# Patient Record
Sex: Male | Born: 1960
Health system: Southern US, Community
[De-identification: ages and names within clinical notes are randomized; demographics above are authoritative.]

## PROBLEM LIST (undated history)

## (undated) DIAGNOSIS — E871 Hypo-osmolality and hyponatremia: Secondary | ICD-10-CM

## (undated) DIAGNOSIS — M48061 Spinal stenosis, lumbar region without neurogenic claudication: Secondary | ICD-10-CM

## (undated) DIAGNOSIS — N5201 Erectile dysfunction due to arterial insufficiency: Secondary | ICD-10-CM

## (undated) DIAGNOSIS — M545 Low back pain: Secondary | ICD-10-CM

## (undated) DIAGNOSIS — I739 Peripheral vascular disease, unspecified: Secondary | ICD-10-CM

## (undated) DIAGNOSIS — S90852A Superficial foreign body, left foot, initial encounter: Secondary | ICD-10-CM

## (undated) DIAGNOSIS — E119 Type 2 diabetes mellitus without complications: Secondary | ICD-10-CM

## (undated) DIAGNOSIS — L97512 Non-pressure chronic ulcer of other part of right foot with fat layer exposed: Secondary | ICD-10-CM

## (undated) DIAGNOSIS — L03032 Cellulitis of left toe: Secondary | ICD-10-CM

## (undated) DIAGNOSIS — L02619 Cutaneous abscess of unspecified foot: Secondary | ICD-10-CM

## (undated) DIAGNOSIS — M5417 Radiculopathy, lumbosacral region: Secondary | ICD-10-CM

## (undated) DIAGNOSIS — M1990 Unspecified osteoarthritis, unspecified site: Secondary | ICD-10-CM

## (undated) DIAGNOSIS — I10 Essential (primary) hypertension: Secondary | ICD-10-CM

## (undated) DIAGNOSIS — E11621 Type 2 diabetes mellitus with foot ulcer: Secondary | ICD-10-CM

## (undated) DIAGNOSIS — K5790 Diverticulosis of intestine, part unspecified, without perforation or abscess without bleeding: Secondary | ICD-10-CM

## (undated) DIAGNOSIS — L97524 Non-pressure chronic ulcer of other part of left foot with necrosis of bone: Secondary | ICD-10-CM

## (undated) DIAGNOSIS — M5126 Other intervertebral disc displacement, lumbar region: Secondary | ICD-10-CM

## (undated) DIAGNOSIS — M2042 Other hammer toe(s) (acquired), left foot: Secondary | ICD-10-CM

## (undated) DIAGNOSIS — M2041 Other hammer toe(s) (acquired), right foot: Secondary | ICD-10-CM

## (undated) DIAGNOSIS — M86472 Chronic osteomyelitis with draining sinus, left ankle and foot: Secondary | ICD-10-CM

## (undated) DIAGNOSIS — IMO0002 Reserved for concepts with insufficient information to code with codable children: Secondary | ICD-10-CM

## (undated) DIAGNOSIS — M5136 Other intervertebral disc degeneration, lumbar region: Secondary | ICD-10-CM

## (undated) DIAGNOSIS — M5416 Radiculopathy, lumbar region: Secondary | ICD-10-CM

## (undated) DIAGNOSIS — M869 Osteomyelitis, unspecified: Secondary | ICD-10-CM

## (undated) DIAGNOSIS — IMO0001 Reserved for inherently not codable concepts without codable children: Secondary | ICD-10-CM

## (undated) DIAGNOSIS — R079 Chest pain, unspecified: Secondary | ICD-10-CM

## (undated) DIAGNOSIS — E1151 Type 2 diabetes mellitus with diabetic peripheral angiopathy without gangrene: Secondary | ICD-10-CM

## (undated) DIAGNOSIS — G8929 Other chronic pain: Secondary | ICD-10-CM

## (undated) DIAGNOSIS — M25551 Pain in right hip: Secondary | ICD-10-CM

## (undated) DIAGNOSIS — L02612 Cutaneous abscess of left foot: Secondary | ICD-10-CM

## (undated) DIAGNOSIS — I2699 Other pulmonary embolism without acute cor pulmonale: Secondary | ICD-10-CM

## (undated) DIAGNOSIS — L089 Local infection of the skin and subcutaneous tissue, unspecified: Secondary | ICD-10-CM

## (undated) DIAGNOSIS — D649 Anemia, unspecified: Secondary | ICD-10-CM

## (undated) DIAGNOSIS — L03039 Cellulitis of unspecified toe: Secondary | ICD-10-CM

## (undated) DIAGNOSIS — S98119A Complete traumatic amputation of unspecified great toe, initial encounter: Secondary | ICD-10-CM

## (undated) DIAGNOSIS — M549 Dorsalgia, unspecified: Secondary | ICD-10-CM

## (undated) DIAGNOSIS — E785 Hyperlipidemia, unspecified: Secondary | ICD-10-CM

## (undated) DIAGNOSIS — E1142 Type 2 diabetes mellitus with diabetic polyneuropathy: Secondary | ICD-10-CM

## (undated) HISTORY — DX: Reserved for inherently not codable concepts without codable children: IMO0001

## (undated) HISTORY — DX: Peripheral vascular disease, unspecified: I73.9

## (undated) HISTORY — DX: Chest pain, unspecified: R07.9

## (undated) HISTORY — DX: Other intervertebral disc degeneration, lumbar region: M51.36

## (undated) HISTORY — DX: Cellulitis of unspecified toe: L03.039

## (undated) HISTORY — PX: CARDIAC CATHETERIZATION: SHX172

## (undated) HISTORY — DX: Other intervertebral disc displacement, lumbar region: M51.26

## (undated) HISTORY — DX: Non-pressure chronic ulcer of other part of right foot with fat layer exposed: L97.512

## (undated) HISTORY — DX: Radiculopathy, lumbosacral region: M54.17

## (undated) HISTORY — DX: Hyperlipidemia, unspecified: E78.5

## (undated) HISTORY — DX: Other pulmonary embolism without acute cor pulmonale: I26.99

## (undated) HISTORY — DX: Osteomyelitis, unspecified: M86.9

## (undated) HISTORY — DX: Reserved for concepts with insufficient information to code with codable children: IMO0002

## (undated) HISTORY — DX: Low back pain: M54.5

## (undated) HISTORY — DX: Unspecified osteoarthritis, unspecified site: M19.90

## (undated) HISTORY — DX: Pain in right hip: M25.551

## (undated) HISTORY — DX: Cutaneous abscess of unspecified foot: L02.619

## (undated) HISTORY — DX: Anemia, unspecified: D64.9

## (undated) HISTORY — DX: Other hammer toe(s) (acquired), left foot: M20.42

## (undated) HISTORY — DX: Radiculopathy, lumbar region: M54.16

## (undated) HISTORY — DX: Type 2 diabetes mellitus without complications: E11.9

## (undated) HISTORY — DX: Type 2 diabetes mellitus with diabetic polyneuropathy: E11.42

## (undated) HISTORY — DX: Local infection of the skin and subcutaneous tissue, unspecified: L08.9

## (undated) HISTORY — DX: Diverticulosis of intestine, part unspecified, without perforation or abscess without bleeding: K57.90

## (undated) HISTORY — DX: Essential (primary) hypertension: I10

## (undated) HISTORY — DX: Cellulitis of left toe: L03.032

## (undated) HISTORY — DX: Erectile dysfunction due to arterial insufficiency: N52.01

## (undated) HISTORY — DX: Type 2 diabetes mellitus with foot ulcer: E11.621

## (undated) HISTORY — DX: Dorsalgia, unspecified: M54.9

## (undated) HISTORY — DX: Cutaneous abscess of left foot: L02.612

## (undated) HISTORY — DX: Other hammer toe(s) (acquired), right foot: M20.41

## (undated) HISTORY — DX: Non-pressure chronic ulcer of other part of left foot with necrosis of bone: L97.524

## (undated) HISTORY — DX: Superficial foreign body, left foot, initial encounter: S90.852A

## (undated) HISTORY — DX: Hypo-osmolality and hyponatremia: E87.1

## (undated) HISTORY — DX: Chronic osteomyelitis with draining sinus, left ankle and foot: M86.472

## (undated) HISTORY — DX: Other chronic pain: G89.29

## (undated) HISTORY — DX: Spinal stenosis, lumbar region without neurogenic claudication: M48.061

## (undated) HISTORY — DX: Complete traumatic amputation of unspecified great toe, initial encounter: S98.119A

## (undated) HISTORY — DX: Type 2 diabetes mellitus with diabetic peripheral angiopathy without gangrene: E11.51

## (undated) HISTORY — PX: TOE AMPUTATION: SHX809

## (undated) HISTORY — PX: KNEE CARTILAGE SURGERY: SHX688

---

## 2014-03-31 DIAGNOSIS — L97512 Non-pressure chronic ulcer of other part of right foot with fat layer exposed: Secondary | ICD-10-CM | POA: Insufficient documentation

## 2014-03-31 HISTORY — DX: Non-pressure chronic ulcer of other part of right foot with fat layer exposed: L97.512

## 2014-04-02 DIAGNOSIS — IMO0001 Reserved for inherently not codable concepts without codable children: Secondary | ICD-10-CM

## 2014-04-02 DIAGNOSIS — D649 Anemia, unspecified: Secondary | ICD-10-CM

## 2014-04-02 DIAGNOSIS — E785 Hyperlipidemia, unspecified: Secondary | ICD-10-CM

## 2014-04-02 DIAGNOSIS — E1151 Type 2 diabetes mellitus with diabetic peripheral angiopathy without gangrene: Secondary | ICD-10-CM

## 2014-04-02 HISTORY — DX: Hyperlipidemia, unspecified: E78.5

## 2014-04-02 HISTORY — DX: Anemia, unspecified: D64.9

## 2014-04-02 HISTORY — DX: Type 2 diabetes mellitus with diabetic peripheral angiopathy without gangrene: E11.51

## 2014-04-02 HISTORY — DX: Reserved for inherently not codable concepts without codable children: IMO0001

## 2014-04-07 DIAGNOSIS — M549 Dorsalgia, unspecified: Secondary | ICD-10-CM

## 2014-04-07 DIAGNOSIS — M25551 Pain in right hip: Secondary | ICD-10-CM

## 2014-04-07 HISTORY — DX: Pain in right hip: M25.551

## 2014-04-07 HISTORY — DX: Dorsalgia, unspecified: M54.9

## 2014-04-27 DIAGNOSIS — I10 Essential (primary) hypertension: Secondary | ICD-10-CM | POA: Insufficient documentation

## 2014-05-22 ENCOUNTER — Ambulatory Visit (INDEPENDENT_AMBULATORY_CARE_PROVIDER_SITE_OTHER): Payer: Medicare Other

## 2014-05-22 VITALS — BP 143/69 | HR 86 | Resp 12

## 2014-05-22 DIAGNOSIS — R52 Pain, unspecified: Secondary | ICD-10-CM

## 2014-05-22 DIAGNOSIS — M86671 Other chronic osteomyelitis, right ankle and foot: Secondary | ICD-10-CM

## 2014-05-22 DIAGNOSIS — E1140 Type 2 diabetes mellitus with diabetic neuropathy, unspecified: Secondary | ICD-10-CM

## 2014-05-22 DIAGNOSIS — L97514 Non-pressure chronic ulcer of other part of right foot with necrosis of bone: Secondary | ICD-10-CM

## 2014-05-22 MED ORDER — CLINDAMYCIN HCL 300 MG PO CAPS: 300.0000 mg | ORAL_CAPSULE | Freq: Three times a day (TID) | ORAL | Status: AC

## 2014-05-22 MED ORDER — SILVER SULFADIAZINE 1 % EX CREA: 1.0000 "application " | TOPICAL_CREAM | Freq: Every day | CUTANEOUS | Status: AC

## 2014-05-22 NOTE — Patient Instructions (Signed)
Pre-Operative Instructions  Congratulations, you have decided to take an important step to improving your quality of life.  You can be assured that the doctors of [facility] will be with you every step of the way.  1. Plan to be at the surgery center/hospital at least 1 (one) hour prior to your scheduled time unless otherwise directed by the surgical center/hospital staff.  You must have a responsible adult accompany you, remain during the surgery and drive you home.  Make sure you have directions to the surgical center/hospital and know how to get there on time. 2. For hospital based surgery you will need to obtain a history and physical form from your family physician within 1 month prior to the date of surgery- we will give you a form for you primary physician.  3. We make every effort to accommodate the date you request for surgery.  There are however, times where surgery dates or times have to be moved.  We will contact you as soon as possible if a change in schedule is required.   4. No Aspirin/Ibuprofen for one week before surgery.  If you are on aspirin, any non-steroidal anti-inflammatory medications (Mobic, Aleve, Ibuprofen) you should stop taking it 7 days prior to your surgery.  You make take Tylenol  For pain prior to surgery.  5. Medications- If you are taking daily heart and blood pressure medications, seizure, reflux, allergy, asthma, anxiety, pain or diabetes medications, make sure the surgery center/hospital is aware before the day of surgery so they may notify you which medications to take or avoid the day of surgery. 6. No food or drink after midnight the night before surgery unless directed otherwise by surgical center/hospital staff. 7. No alcoholic beverages 24 hours prior to surgery.  No smoking 24 hours prior to or 24 hours after surgery. 8. Wear loose pants or shorts- loose enough to fit over bandages, boots, and casts. 9. No slip on shoes, sneakers are best. 10. Bring  your boot with you to the surgery center/hospital.  Also bring crutches or a walker if your physician has prescribed it for you.  If you do not have this equipment, it will be provided for you after surgery. 11. If you have not been contracted by the surgery center/hospital by the day before your surgery, call to confirm the date and time of your surgery. 12. Leave-time from work may vary depending on the type of surgery you have.  Appropriate arrangements should be made prior to surgery with your employer. 13. Prescriptions will be provided immediately following surgery by your doctor.  Have these filled as soon as possible after surgery and take the medication as directed. 14. Remove nail polish on the operative foot. 15. Wash the night before surgery.  The night before surgery wash the foot and leg well with the antibacterial soap provided and water paying special attention to beneath the toenails and in between the toes.  Rinse thoroughly with water and dry well with a towel.  Perform this wash unless told not to do so by your physician.  Enclosed: 1 Ice pack (please put in freezer the night before surgery)   1 Hibiclens skin cleaner   Pre-op Instructions  If you have any questions regarding the instructions, do not hesitate to call our office.  [place]: [address] , [place] [address] [phone]  [place]: [address] [place], [place] [address] [phone]  [place]: [address]  [place], [place] [address] [phone]  Dr. Toya Palacios   Tuchman DPM, Dr. Norman Regal DPM Dr. Carmon Brigandi DPM, Dr. M. Todd Hyatt DPM, Dr. Kathryn Egerton DPM 

## 2014-05-22 NOTE — Progress Notes (Signed)
   Subjective:    Patient ID: [place], male    DOB: [birth year], 53 y.o.   MRN: [id number]  HPI  PT STATED RT FOOT GREAT TOE HAVE AN OPEN WOUND AND HAVE DRAINIGE FOR 3 YEARS. THE TOE IS GETTING WORSE AND IS NOT GETTING ANY BETTER. PT IS DIABETIC AND DO NOT HAVE ANY SENSATION OR PART OF THE TOE CANNOT FEEL IT. DR. Delora [place] FROM WOUND CARE BEEN TAKING CARE OF THE WOUND. ALSO, 5 WEEKS AGO WENT TO [name]. STATED HAVE BONE INFECTION.  Review of Systems  Musculoskeletal: Positive for back pain and gait problem.  All other systems reviewed and are negative.      Objective:   Physical Exam Patient is a 53 year old white male well-developed well-nourished oriented 3 recently moved here from [place]. Has a three-year history of an ulcer under the hallux IP joint right great toe with recent treatments including hospitalization and IV antibiotics by Dr. Nigel [place]  Dial.  Patient apparently had an MRI and x-rays today depict lysis of the bone consistent with chronic osteomyelitic changes of the hallux distal proximal phalanx particular the IP joint of the right great toe. The ulcer greater than a centimeter in diameter with a macerated necrotic and fibrous under down to the IP joint itself. Patient's joint is swollen enlarged painful localized edema and erythema no ascending cellulitis noted no fever or chills patient otherwise asymptomatic does have profound diabetic neuropathy. Pedal pulses palpable DP and PT +2 over 4 bilateral mild varicosities noted Refill time 3 seconds all digits. Epicritic sensations grossly diminished on Semmes Weinstein the forefoot digits and arch. There is normal plantar response DTRs not elicited dermatologically skin color pigment normal hair growth present but diminished distally nail somewhat criptotic orthopedic exam there is rigid contracture the toe hallux interphalangeus deformity noted clinically and radiographically x-rays reveal cystic and lytic changes of the  hallux IP joint itself consistent with likely chronic osteomyelitic change. Patient recently had been on antibiotics apparently clindamycin IV however we'll switch over to clindamycin by mouth regimen at this time. Patient is requesting surgery understands the toe needs to be amputated however that because of some insurance issues he is not able to be seen at [facility] and since he is moved here recently is requesting that we do the procedure. Patient's primary physician her diabetes doctor is Dr. Durenda Mason in [name] will get approval from Dr. [place] for the proposed surgery. Remainder the exam unremarkable noncontributory.      Assessment & Plan:  Assessment diabetes with peripheral neuropathy and chronic ulceration likely with chronic osteolytic change of the hallux IP joint with exposure down to bone and capsule. Plan at this time likely amputation at the [facility] joint consent form and risks and cup occasions are reviewed all questions asked by the patient are answered there were medications and surgery scheduled his convenience at the [facility] specialty surgical center on an outpatient basis. Will be with by mouth antibiotic regimen postoperatively and at this time initiate clindamycin and Silvadene and gauze dressing changes daily. Consent form is reviewed and signed and surgery scheduled within the next week at patient's convenience.  [place] DPM

## 2014-05-24 ENCOUNTER — Telehealth: Payer: Self-pay | Admitting: *Deleted

## 2014-05-24 NOTE — Telephone Encounter (Addendum)
Pt states he was scheduled for a big toe amputation, and prescribed 2 medications, but one of the antibiotics cost over $75.00.  Could Dr. Blenda Mounts change the medication?  I spoke with the [facility] pharmacist [phone] if the Clinamycin 300mg  was a generic, she states it was and cost over $75.00, but could order Clinamycin 150mg  2 capsules tid #60 for $45.69.  I okayed the Clindamycin 150mg  #60 and informed pt.  Pt states he feels he could more afford the $45.69 than $75.00, but will check his accounts and call again.  Pt states he cannot afford the $45.69, ask if  there is an alternative medication.  Dr. Blenda Mounts ordered Septra DS #30 one tablet bid with one refill.  Orders called to [facility] [phone] at 305pm given to Pharmacist Stacy, and called to pt.

## 2014-11-22 DIAGNOSIS — E871 Hypo-osmolality and hyponatremia: Secondary | ICD-10-CM

## 2014-11-22 HISTORY — DX: Hypo-osmolality and hyponatremia: E87.1

## 2014-12-19 DIAGNOSIS — M545 Low back pain, unspecified: Secondary | ICD-10-CM | POA: Insufficient documentation

## 2014-12-19 DIAGNOSIS — G8929 Other chronic pain: Secondary | ICD-10-CM

## 2014-12-19 HISTORY — DX: Other chronic pain: G89.29

## 2014-12-19 HISTORY — DX: Low back pain, unspecified: M54.50

## 2015-01-08 DIAGNOSIS — L97509 Non-pressure chronic ulcer of other part of unspecified foot with unspecified severity: Secondary | ICD-10-CM | POA: Diagnosis not present

## 2015-01-08 DIAGNOSIS — E10621 Type 1 diabetes mellitus with foot ulcer: Secondary | ICD-10-CM | POA: Diagnosis not present

## 2015-01-08 DIAGNOSIS — Z89411 Acquired absence of right great toe: Secondary | ICD-10-CM | POA: Diagnosis not present

## 2015-01-08 DIAGNOSIS — Z89421 Acquired absence of other right toe(s): Secondary | ICD-10-CM | POA: Diagnosis not present

## 2015-01-12 DIAGNOSIS — G894 Chronic pain syndrome: Secondary | ICD-10-CM | POA: Diagnosis not present

## 2015-01-14 DIAGNOSIS — Z89412 Acquired absence of left great toe: Secondary | ICD-10-CM | POA: Diagnosis not present

## 2015-01-14 DIAGNOSIS — A4902 Methicillin resistant Staphylococcus aureus infection, unspecified site: Secondary | ICD-10-CM | POA: Diagnosis not present

## 2015-01-14 DIAGNOSIS — E1151 Type 2 diabetes mellitus with diabetic peripheral angiopathy without gangrene: Secondary | ICD-10-CM | POA: Diagnosis not present

## 2015-01-14 DIAGNOSIS — L97529 Non-pressure chronic ulcer of other part of left foot with unspecified severity: Secondary | ICD-10-CM | POA: Diagnosis not present

## 2015-01-14 DIAGNOSIS — Z87891 Personal history of nicotine dependence: Secondary | ICD-10-CM | POA: Diagnosis not present

## 2015-01-14 DIAGNOSIS — M5441 Lumbago with sciatica, right side: Secondary | ICD-10-CM | POA: Diagnosis not present

## 2015-01-14 DIAGNOSIS — E11628 Type 2 diabetes mellitus with other skin complications: Secondary | ICD-10-CM | POA: Diagnosis not present

## 2015-01-14 DIAGNOSIS — E785 Hyperlipidemia, unspecified: Secondary | ICD-10-CM | POA: Diagnosis not present

## 2015-01-14 DIAGNOSIS — L97429 Non-pressure chronic ulcer of left heel and midfoot with unspecified severity: Secondary | ICD-10-CM | POA: Diagnosis not present

## 2015-01-14 DIAGNOSIS — G4730 Sleep apnea, unspecified: Secondary | ICD-10-CM | POA: Diagnosis not present

## 2015-01-14 DIAGNOSIS — Z89411 Acquired absence of right great toe: Secondary | ICD-10-CM | POA: Diagnosis not present

## 2015-01-14 DIAGNOSIS — L02612 Cutaneous abscess of left foot: Secondary | ICD-10-CM | POA: Diagnosis not present

## 2015-01-14 DIAGNOSIS — G8929 Other chronic pain: Secondary | ICD-10-CM | POA: Diagnosis not present

## 2015-01-14 DIAGNOSIS — Z79899 Other long term (current) drug therapy: Secondary | ICD-10-CM | POA: Diagnosis not present

## 2015-01-14 DIAGNOSIS — Z452 Encounter for adjustment and management of vascular access device: Secondary | ICD-10-CM | POA: Diagnosis not present

## 2015-01-14 DIAGNOSIS — I252 Old myocardial infarction: Secondary | ICD-10-CM | POA: Diagnosis not present

## 2015-01-14 DIAGNOSIS — L97521 Non-pressure chronic ulcer of other part of left foot limited to breakdown of skin: Secondary | ICD-10-CM | POA: Diagnosis not present

## 2015-01-14 DIAGNOSIS — E11621 Type 2 diabetes mellitus with foot ulcer: Secondary | ICD-10-CM | POA: Diagnosis not present

## 2015-01-14 DIAGNOSIS — E1142 Type 2 diabetes mellitus with diabetic polyneuropathy: Secondary | ICD-10-CM | POA: Diagnosis not present

## 2015-01-14 DIAGNOSIS — M7989 Other specified soft tissue disorders: Secondary | ICD-10-CM | POA: Diagnosis not present

## 2015-01-14 DIAGNOSIS — M86172 Other acute osteomyelitis, left ankle and foot: Secondary | ICD-10-CM | POA: Diagnosis not present

## 2015-01-14 DIAGNOSIS — E118 Type 2 diabetes mellitus with unspecified complications: Secondary | ICD-10-CM | POA: Diagnosis not present

## 2015-01-14 DIAGNOSIS — M19072 Primary osteoarthritis, left ankle and foot: Secondary | ICD-10-CM | POA: Diagnosis not present

## 2015-01-14 DIAGNOSIS — Z6838 Body mass index (BMI) 38.0-38.9, adult: Secondary | ICD-10-CM | POA: Diagnosis not present

## 2015-01-14 DIAGNOSIS — M869 Osteomyelitis, unspecified: Secondary | ICD-10-CM | POA: Diagnosis not present

## 2015-01-14 DIAGNOSIS — M545 Low back pain: Secondary | ICD-10-CM | POA: Diagnosis not present

## 2015-01-14 DIAGNOSIS — I10 Essential (primary) hypertension: Secondary | ICD-10-CM | POA: Diagnosis not present

## 2015-01-14 DIAGNOSIS — Z794 Long term (current) use of insulin: Secondary | ICD-10-CM | POA: Diagnosis not present

## 2015-01-14 DIAGNOSIS — Z89421 Acquired absence of other right toe(s): Secondary | ICD-10-CM | POA: Diagnosis not present

## 2015-01-14 DIAGNOSIS — E1165 Type 2 diabetes mellitus with hyperglycemia: Secondary | ICD-10-CM | POA: Diagnosis not present

## 2015-01-14 DIAGNOSIS — I2510 Atherosclerotic heart disease of native coronary artery without angina pectoris: Secondary | ICD-10-CM | POA: Diagnosis not present

## 2015-01-16 DIAGNOSIS — M869 Osteomyelitis, unspecified: Secondary | ICD-10-CM

## 2015-01-16 HISTORY — DX: Osteomyelitis, unspecified: M86.9

## 2015-01-20 DIAGNOSIS — M869 Osteomyelitis, unspecified: Secondary | ICD-10-CM | POA: Diagnosis not present

## 2015-01-23 DIAGNOSIS — M5416 Radiculopathy, lumbar region: Secondary | ICD-10-CM | POA: Diagnosis not present

## 2015-01-23 DIAGNOSIS — G894 Chronic pain syndrome: Secondary | ICD-10-CM | POA: Diagnosis not present

## 2015-01-23 DIAGNOSIS — F4542 Pain disorder with related psychological factors: Secondary | ICD-10-CM | POA: Diagnosis not present

## 2015-01-23 DIAGNOSIS — M4800 Spinal stenosis, site unspecified: Secondary | ICD-10-CM | POA: Diagnosis not present

## 2015-01-23 DIAGNOSIS — M5126 Other intervertebral disc displacement, lumbar region: Secondary | ICD-10-CM | POA: Diagnosis not present

## 2015-01-23 DIAGNOSIS — M5136 Other intervertebral disc degeneration, lumbar region: Secondary | ICD-10-CM | POA: Diagnosis not present

## 2015-01-25 DIAGNOSIS — M869 Osteomyelitis, unspecified: Secondary | ICD-10-CM | POA: Diagnosis not present

## 2015-01-26 DIAGNOSIS — M869 Osteomyelitis, unspecified: Secondary | ICD-10-CM | POA: Diagnosis not present

## 2015-01-27 DIAGNOSIS — M869 Osteomyelitis, unspecified: Secondary | ICD-10-CM | POA: Diagnosis not present

## 2015-01-30 DIAGNOSIS — E11621 Type 2 diabetes mellitus with foot ulcer: Secondary | ICD-10-CM | POA: Diagnosis not present

## 2015-01-30 DIAGNOSIS — E1165 Type 2 diabetes mellitus with hyperglycemia: Secondary | ICD-10-CM | POA: Diagnosis not present

## 2015-01-30 DIAGNOSIS — Z1389 Encounter for screening for other disorder: Secondary | ICD-10-CM | POA: Diagnosis not present

## 2015-01-30 DIAGNOSIS — Z6841 Body Mass Index (BMI) 40.0 and over, adult: Secondary | ICD-10-CM | POA: Diagnosis not present

## 2015-01-30 DIAGNOSIS — R0981 Nasal congestion: Secondary | ICD-10-CM | POA: Diagnosis not present

## 2015-02-02 DIAGNOSIS — M869 Osteomyelitis, unspecified: Secondary | ICD-10-CM | POA: Diagnosis not present

## 2015-02-08 DIAGNOSIS — J069 Acute upper respiratory infection, unspecified: Secondary | ICD-10-CM | POA: Diagnosis not present

## 2015-02-08 DIAGNOSIS — Z6839 Body mass index (BMI) 39.0-39.9, adult: Secondary | ICD-10-CM | POA: Diagnosis not present

## 2015-02-08 DIAGNOSIS — R05 Cough: Secondary | ICD-10-CM | POA: Diagnosis not present

## 2015-02-10 DIAGNOSIS — Z794 Long term (current) use of insulin: Secondary | ICD-10-CM | POA: Diagnosis not present

## 2015-02-10 DIAGNOSIS — I10 Essential (primary) hypertension: Secondary | ICD-10-CM | POA: Diagnosis not present

## 2015-02-10 DIAGNOSIS — E118 Type 2 diabetes mellitus with unspecified complications: Secondary | ICD-10-CM | POA: Diagnosis not present

## 2015-02-10 DIAGNOSIS — E1165 Type 2 diabetes mellitus with hyperglycemia: Secondary | ICD-10-CM | POA: Diagnosis not present

## 2015-02-10 DIAGNOSIS — Z87891 Personal history of nicotine dependence: Secondary | ICD-10-CM | POA: Diagnosis not present

## 2015-02-10 DIAGNOSIS — Z79899 Other long term (current) drug therapy: Secondary | ICD-10-CM | POA: Diagnosis not present

## 2015-02-10 DIAGNOSIS — K5792 Diverticulitis of intestine, part unspecified, without perforation or abscess without bleeding: Secondary | ICD-10-CM | POA: Diagnosis not present

## 2015-02-10 DIAGNOSIS — I998 Other disorder of circulatory system: Secondary | ICD-10-CM | POA: Diagnosis not present

## 2015-02-12 DIAGNOSIS — L608 Other nail disorders: Secondary | ICD-10-CM | POA: Diagnosis not present

## 2015-02-12 DIAGNOSIS — Z89421 Acquired absence of other right toe(s): Secondary | ICD-10-CM | POA: Diagnosis not present

## 2015-02-12 DIAGNOSIS — L03032 Cellulitis of left toe: Secondary | ICD-10-CM | POA: Diagnosis not present

## 2015-02-12 DIAGNOSIS — E1142 Type 2 diabetes mellitus with diabetic polyneuropathy: Secondary | ICD-10-CM | POA: Diagnosis not present

## 2015-02-12 DIAGNOSIS — Z89411 Acquired absence of right great toe: Secondary | ICD-10-CM | POA: Diagnosis not present

## 2015-02-27 DIAGNOSIS — M5126 Other intervertebral disc displacement, lumbar region: Secondary | ICD-10-CM | POA: Diagnosis not present

## 2015-02-27 DIAGNOSIS — G894 Chronic pain syndrome: Secondary | ICD-10-CM | POA: Diagnosis not present

## 2015-02-27 DIAGNOSIS — F4542 Pain disorder with related psychological factors: Secondary | ICD-10-CM | POA: Diagnosis not present

## 2015-02-27 DIAGNOSIS — M4800 Spinal stenosis, site unspecified: Secondary | ICD-10-CM | POA: Diagnosis not present

## 2015-02-27 DIAGNOSIS — M5136 Other intervertebral disc degeneration, lumbar region: Secondary | ICD-10-CM | POA: Diagnosis not present

## 2015-02-27 DIAGNOSIS — M5416 Radiculopathy, lumbar region: Secondary | ICD-10-CM | POA: Diagnosis not present

## 2015-03-06 DIAGNOSIS — Z89412 Acquired absence of left great toe: Secondary | ICD-10-CM | POA: Diagnosis not present

## 2015-03-06 DIAGNOSIS — E780 Pure hypercholesterolemia: Secondary | ICD-10-CM | POA: Diagnosis not present

## 2015-03-06 DIAGNOSIS — E119 Type 2 diabetes mellitus without complications: Secondary | ICD-10-CM | POA: Diagnosis not present

## 2015-03-06 DIAGNOSIS — Z79899 Other long term (current) drug therapy: Secondary | ICD-10-CM | POA: Diagnosis not present

## 2015-03-06 DIAGNOSIS — M87875 Other osteonecrosis, left foot: Secondary | ICD-10-CM | POA: Diagnosis not present

## 2015-03-06 DIAGNOSIS — R1084 Generalized abdominal pain: Secondary | ICD-10-CM | POA: Diagnosis not present

## 2015-03-06 DIAGNOSIS — S39011A Strain of muscle, fascia and tendon of abdomen, initial encounter: Secondary | ICD-10-CM | POA: Diagnosis not present

## 2015-03-06 DIAGNOSIS — I96 Gangrene, not elsewhere classified: Secondary | ICD-10-CM | POA: Diagnosis not present

## 2015-03-06 DIAGNOSIS — Z794 Long term (current) use of insulin: Secondary | ICD-10-CM | POA: Diagnosis not present

## 2015-03-06 DIAGNOSIS — I10 Essential (primary) hypertension: Secondary | ICD-10-CM | POA: Diagnosis not present

## 2015-03-07 DIAGNOSIS — M868X7 Other osteomyelitis, ankle and foot: Secondary | ICD-10-CM | POA: Diagnosis not present

## 2015-03-07 DIAGNOSIS — R1031 Right lower quadrant pain: Secondary | ICD-10-CM | POA: Diagnosis not present

## 2015-03-07 DIAGNOSIS — L089 Local infection of the skin and subcutaneous tissue, unspecified: Secondary | ICD-10-CM | POA: Diagnosis not present

## 2015-03-07 DIAGNOSIS — E780 Pure hypercholesterolemia: Secondary | ICD-10-CM | POA: Diagnosis not present

## 2015-03-07 DIAGNOSIS — I10 Essential (primary) hypertension: Secondary | ICD-10-CM | POA: Diagnosis not present

## 2015-03-07 DIAGNOSIS — M609 Myositis, unspecified: Secondary | ICD-10-CM | POA: Diagnosis not present

## 2015-03-07 DIAGNOSIS — Z87891 Personal history of nicotine dependence: Secondary | ICD-10-CM | POA: Diagnosis not present

## 2015-03-07 DIAGNOSIS — L97529 Non-pressure chronic ulcer of other part of left foot with unspecified severity: Secondary | ICD-10-CM | POA: Diagnosis not present

## 2015-03-07 DIAGNOSIS — E1165 Type 2 diabetes mellitus with hyperglycemia: Secondary | ICD-10-CM | POA: Diagnosis not present

## 2015-03-07 DIAGNOSIS — G8929 Other chronic pain: Secondary | ICD-10-CM | POA: Diagnosis not present

## 2015-03-07 DIAGNOSIS — M86172 Other acute osteomyelitis, left ankle and foot: Secondary | ICD-10-CM | POA: Diagnosis not present

## 2015-03-07 DIAGNOSIS — Z8614 Personal history of Methicillin resistant Staphylococcus aureus infection: Secondary | ICD-10-CM | POA: Diagnosis not present

## 2015-03-07 DIAGNOSIS — Z89422 Acquired absence of other left toe(s): Secondary | ICD-10-CM | POA: Diagnosis not present

## 2015-03-07 DIAGNOSIS — E785 Hyperlipidemia, unspecified: Secondary | ICD-10-CM | POA: Diagnosis not present

## 2015-03-07 DIAGNOSIS — Z794 Long term (current) use of insulin: Secondary | ICD-10-CM | POA: Diagnosis not present

## 2015-03-07 DIAGNOSIS — D649 Anemia, unspecified: Secondary | ICD-10-CM | POA: Diagnosis not present

## 2015-03-07 DIAGNOSIS — M4800 Spinal stenosis, site unspecified: Secondary | ICD-10-CM | POA: Diagnosis not present

## 2015-03-07 DIAGNOSIS — E1151 Type 2 diabetes mellitus with diabetic peripheral angiopathy without gangrene: Secondary | ICD-10-CM | POA: Diagnosis not present

## 2015-03-07 DIAGNOSIS — Z79899 Other long term (current) drug therapy: Secondary | ICD-10-CM | POA: Diagnosis not present

## 2015-03-07 DIAGNOSIS — E11621 Type 2 diabetes mellitus with foot ulcer: Secondary | ICD-10-CM | POA: Diagnosis not present

## 2015-03-07 DIAGNOSIS — M87875 Other osteonecrosis, left foot: Secondary | ICD-10-CM | POA: Diagnosis not present

## 2015-03-07 DIAGNOSIS — Z89412 Acquired absence of left great toe: Secondary | ICD-10-CM | POA: Diagnosis not present

## 2015-03-07 DIAGNOSIS — H269 Unspecified cataract: Secondary | ICD-10-CM | POA: Diagnosis not present

## 2015-03-07 DIAGNOSIS — L03032 Cellulitis of left toe: Secondary | ICD-10-CM | POA: Diagnosis not present

## 2015-03-07 DIAGNOSIS — S39011A Strain of muscle, fascia and tendon of abdomen, initial encounter: Secondary | ICD-10-CM | POA: Diagnosis not present

## 2015-03-07 DIAGNOSIS — E119 Type 2 diabetes mellitus without complications: Secondary | ICD-10-CM | POA: Diagnosis not present

## 2015-03-07 DIAGNOSIS — M869 Osteomyelitis, unspecified: Secondary | ICD-10-CM | POA: Diagnosis not present

## 2015-03-19 DIAGNOSIS — Z89429 Acquired absence of other toe(s), unspecified side: Secondary | ICD-10-CM | POA: Diagnosis not present

## 2015-03-19 DIAGNOSIS — F419 Anxiety disorder, unspecified: Secondary | ICD-10-CM | POA: Diagnosis not present

## 2015-03-19 DIAGNOSIS — E1165 Type 2 diabetes mellitus with hyperglycemia: Secondary | ICD-10-CM | POA: Diagnosis not present

## 2015-03-19 DIAGNOSIS — G4700 Insomnia, unspecified: Secondary | ICD-10-CM | POA: Diagnosis not present

## 2015-03-19 DIAGNOSIS — Z794 Long term (current) use of insulin: Secondary | ICD-10-CM | POA: Diagnosis not present

## 2015-04-07 DIAGNOSIS — G603 Idiopathic progressive neuropathy: Secondary | ICD-10-CM | POA: Diagnosis not present

## 2015-04-07 DIAGNOSIS — M5441 Lumbago with sciatica, right side: Secondary | ICD-10-CM | POA: Diagnosis not present

## 2015-04-07 DIAGNOSIS — Z79899 Other long term (current) drug therapy: Secondary | ICD-10-CM | POA: Diagnosis not present

## 2015-04-07 DIAGNOSIS — G890 Central pain syndrome: Secondary | ICD-10-CM | POA: Diagnosis not present

## 2015-04-07 DIAGNOSIS — M545 Low back pain: Secondary | ICD-10-CM | POA: Diagnosis not present

## 2015-04-07 DIAGNOSIS — G541 Lumbosacral plexus disorders: Secondary | ICD-10-CM | POA: Diagnosis not present

## 2015-04-18 DIAGNOSIS — Z79891 Long term (current) use of opiate analgesic: Secondary | ICD-10-CM | POA: Diagnosis not present

## 2015-04-18 DIAGNOSIS — G890 Central pain syndrome: Secondary | ICD-10-CM | POA: Diagnosis not present

## 2015-04-18 DIAGNOSIS — M25552 Pain in left hip: Secondary | ICD-10-CM | POA: Diagnosis not present

## 2015-04-18 DIAGNOSIS — M545 Low back pain: Secondary | ICD-10-CM | POA: Diagnosis not present

## 2015-04-18 DIAGNOSIS — M25551 Pain in right hip: Secondary | ICD-10-CM | POA: Diagnosis not present

## 2015-04-27 DIAGNOSIS — M545 Low back pain: Secondary | ICD-10-CM | POA: Diagnosis not present

## 2015-04-27 DIAGNOSIS — R262 Difficulty in walking, not elsewhere classified: Secondary | ICD-10-CM | POA: Diagnosis not present

## 2015-04-27 DIAGNOSIS — E119 Type 2 diabetes mellitus without complications: Secondary | ICD-10-CM | POA: Diagnosis not present

## 2015-04-27 DIAGNOSIS — M6281 Muscle weakness (generalized): Secondary | ICD-10-CM | POA: Diagnosis not present

## 2015-05-03 DIAGNOSIS — F419 Anxiety disorder, unspecified: Secondary | ICD-10-CM | POA: Diagnosis not present

## 2015-05-03 DIAGNOSIS — E1165 Type 2 diabetes mellitus with hyperglycemia: Secondary | ICD-10-CM | POA: Diagnosis not present

## 2015-05-03 DIAGNOSIS — E663 Overweight: Secondary | ICD-10-CM | POA: Diagnosis not present

## 2015-05-03 DIAGNOSIS — G629 Polyneuropathy, unspecified: Secondary | ICD-10-CM | POA: Diagnosis not present

## 2015-05-03 DIAGNOSIS — E785 Hyperlipidemia, unspecified: Secondary | ICD-10-CM | POA: Diagnosis not present

## 2015-05-08 DIAGNOSIS — M545 Low back pain: Secondary | ICD-10-CM | POA: Diagnosis not present

## 2015-05-08 DIAGNOSIS — R262 Difficulty in walking, not elsewhere classified: Secondary | ICD-10-CM | POA: Diagnosis not present

## 2015-05-08 DIAGNOSIS — M6281 Muscle weakness (generalized): Secondary | ICD-10-CM | POA: Diagnosis not present

## 2015-05-10 DIAGNOSIS — E1151 Type 2 diabetes mellitus with diabetic peripheral angiopathy without gangrene: Secondary | ICD-10-CM | POA: Diagnosis not present

## 2015-05-10 DIAGNOSIS — Z0000 Encounter for general adult medical examination without abnormal findings: Secondary | ICD-10-CM | POA: Diagnosis not present

## 2015-05-10 DIAGNOSIS — E663 Overweight: Secondary | ICD-10-CM | POA: Diagnosis not present

## 2015-05-10 DIAGNOSIS — E1140 Type 2 diabetes mellitus with diabetic neuropathy, unspecified: Secondary | ICD-10-CM | POA: Diagnosis not present

## 2015-05-10 DIAGNOSIS — M545 Low back pain: Secondary | ICD-10-CM | POA: Diagnosis not present

## 2015-05-10 DIAGNOSIS — Z125 Encounter for screening for malignant neoplasm of prostate: Secondary | ICD-10-CM | POA: Diagnosis not present

## 2015-05-10 DIAGNOSIS — Z79899 Other long term (current) drug therapy: Secondary | ICD-10-CM | POA: Diagnosis not present

## 2015-05-10 DIAGNOSIS — M6281 Muscle weakness (generalized): Secondary | ICD-10-CM | POA: Diagnosis not present

## 2015-05-10 DIAGNOSIS — E785 Hyperlipidemia, unspecified: Secondary | ICD-10-CM | POA: Diagnosis not present

## 2015-05-10 DIAGNOSIS — R262 Difficulty in walking, not elsewhere classified: Secondary | ICD-10-CM | POA: Diagnosis not present

## 2015-05-14 DIAGNOSIS — G890 Central pain syndrome: Secondary | ICD-10-CM | POA: Diagnosis not present

## 2015-05-14 DIAGNOSIS — M25552 Pain in left hip: Secondary | ICD-10-CM | POA: Diagnosis not present

## 2015-05-14 DIAGNOSIS — M542 Cervicalgia: Secondary | ICD-10-CM | POA: Diagnosis not present

## 2015-05-14 DIAGNOSIS — M25562 Pain in left knee: Secondary | ICD-10-CM | POA: Diagnosis not present

## 2015-05-14 DIAGNOSIS — Z79891 Long term (current) use of opiate analgesic: Secondary | ICD-10-CM | POA: Diagnosis not present

## 2015-05-16 DIAGNOSIS — Z23 Encounter for immunization: Secondary | ICD-10-CM | POA: Diagnosis not present

## 2015-05-16 DIAGNOSIS — M549 Dorsalgia, unspecified: Secondary | ICD-10-CM | POA: Diagnosis not present

## 2015-05-16 DIAGNOSIS — Z6841 Body Mass Index (BMI) 40.0 and over, adult: Secondary | ICD-10-CM | POA: Diagnosis not present

## 2015-05-16 DIAGNOSIS — M25562 Pain in left knee: Secondary | ICD-10-CM | POA: Diagnosis not present

## 2015-05-16 DIAGNOSIS — M62838 Other muscle spasm: Secondary | ICD-10-CM | POA: Diagnosis not present

## 2015-05-25 DIAGNOSIS — I739 Peripheral vascular disease, unspecified: Secondary | ICD-10-CM | POA: Diagnosis not present

## 2015-05-25 DIAGNOSIS — E088 Diabetes mellitus due to underlying condition with unspecified complications: Secondary | ICD-10-CM | POA: Diagnosis not present

## 2015-05-25 DIAGNOSIS — N5201 Erectile dysfunction due to arterial insufficiency: Secondary | ICD-10-CM | POA: Insufficient documentation

## 2015-05-25 DIAGNOSIS — I10 Essential (primary) hypertension: Secondary | ICD-10-CM | POA: Diagnosis not present

## 2015-05-25 DIAGNOSIS — E782 Mixed hyperlipidemia: Secondary | ICD-10-CM | POA: Diagnosis not present

## 2015-05-25 HISTORY — DX: Erectile dysfunction due to arterial insufficiency: N52.01

## 2015-06-01 DIAGNOSIS — G8929 Other chronic pain: Secondary | ICD-10-CM | POA: Diagnosis not present

## 2015-06-01 DIAGNOSIS — M545 Low back pain: Secondary | ICD-10-CM | POA: Diagnosis not present

## 2015-06-06 DIAGNOSIS — G8929 Other chronic pain: Secondary | ICD-10-CM | POA: Diagnosis not present

## 2015-06-06 DIAGNOSIS — M542 Cervicalgia: Secondary | ICD-10-CM | POA: Diagnosis not present

## 2015-06-08 DIAGNOSIS — M4806 Spinal stenosis, lumbar region: Secondary | ICD-10-CM | POA: Diagnosis not present

## 2015-06-08 DIAGNOSIS — M5126 Other intervertebral disc displacement, lumbar region: Secondary | ICD-10-CM | POA: Diagnosis not present

## 2015-06-08 DIAGNOSIS — G8929 Other chronic pain: Secondary | ICD-10-CM | POA: Diagnosis not present

## 2015-06-08 DIAGNOSIS — M545 Low back pain: Secondary | ICD-10-CM | POA: Diagnosis not present

## 2015-06-14 DIAGNOSIS — M545 Low back pain: Secondary | ICD-10-CM | POA: Diagnosis not present

## 2015-06-14 DIAGNOSIS — G8929 Other chronic pain: Secondary | ICD-10-CM | POA: Diagnosis not present

## 2015-06-14 DIAGNOSIS — M4806 Spinal stenosis, lumbar region: Secondary | ICD-10-CM | POA: Diagnosis not present

## 2015-06-14 DIAGNOSIS — M48061 Spinal stenosis, lumbar region without neurogenic claudication: Secondary | ICD-10-CM

## 2015-06-14 DIAGNOSIS — M5136 Other intervertebral disc degeneration, lumbar region: Secondary | ICD-10-CM

## 2015-06-14 HISTORY — DX: Other intervertebral disc degeneration, lumbar region: M51.36

## 2015-06-14 HISTORY — DX: Spinal stenosis, lumbar region without neurogenic claudication: M48.061

## 2015-06-28 DIAGNOSIS — I5021 Acute systolic (congestive) heart failure: Secondary | ICD-10-CM | POA: Diagnosis not present

## 2015-06-28 DIAGNOSIS — E7800 Pure hypercholesterolemia, unspecified: Secondary | ICD-10-CM | POA: Diagnosis not present

## 2015-06-28 DIAGNOSIS — J181 Lobar pneumonia, unspecified organism: Secondary | ICD-10-CM | POA: Diagnosis not present

## 2015-06-28 DIAGNOSIS — R0602 Shortness of breath: Secondary | ICD-10-CM | POA: Diagnosis not present

## 2015-06-28 DIAGNOSIS — R072 Precordial pain: Secondary | ICD-10-CM | POA: Diagnosis not present

## 2015-06-28 DIAGNOSIS — Z7984 Long term (current) use of oral hypoglycemic drugs: Secondary | ICD-10-CM | POA: Diagnosis not present

## 2015-06-28 DIAGNOSIS — E669 Obesity, unspecified: Secondary | ICD-10-CM | POA: Diagnosis not present

## 2015-06-28 DIAGNOSIS — E785 Hyperlipidemia, unspecified: Secondary | ICD-10-CM | POA: Diagnosis not present

## 2015-06-28 DIAGNOSIS — I110 Hypertensive heart disease with heart failure: Secondary | ICD-10-CM | POA: Diagnosis not present

## 2015-06-28 DIAGNOSIS — M549 Dorsalgia, unspecified: Secondary | ICD-10-CM | POA: Diagnosis not present

## 2015-06-28 DIAGNOSIS — I249 Acute ischemic heart disease, unspecified: Secondary | ICD-10-CM | POA: Diagnosis not present

## 2015-06-28 DIAGNOSIS — G8929 Other chronic pain: Secondary | ICD-10-CM | POA: Diagnosis not present

## 2015-06-28 DIAGNOSIS — Z794 Long term (current) use of insulin: Secondary | ICD-10-CM | POA: Diagnosis not present

## 2015-06-28 DIAGNOSIS — Z6839 Body mass index (BMI) 39.0-39.9, adult: Secondary | ICD-10-CM | POA: Diagnosis not present

## 2015-06-28 DIAGNOSIS — Z79899 Other long term (current) drug therapy: Secondary | ICD-10-CM | POA: Diagnosis not present

## 2015-06-28 DIAGNOSIS — R918 Other nonspecific abnormal finding of lung field: Secondary | ICD-10-CM | POA: Diagnosis not present

## 2015-06-28 DIAGNOSIS — E784 Other hyperlipidemia: Secondary | ICD-10-CM | POA: Diagnosis not present

## 2015-06-28 DIAGNOSIS — R079 Chest pain, unspecified: Secondary | ICD-10-CM | POA: Diagnosis not present

## 2015-06-28 DIAGNOSIS — E119 Type 2 diabetes mellitus without complications: Secondary | ICD-10-CM | POA: Diagnosis not present

## 2015-06-29 DIAGNOSIS — R079 Chest pain, unspecified: Secondary | ICD-10-CM | POA: Diagnosis not present

## 2015-07-04 DIAGNOSIS — M5416 Radiculopathy, lumbar region: Secondary | ICD-10-CM | POA: Diagnosis not present

## 2015-07-04 DIAGNOSIS — M5136 Other intervertebral disc degeneration, lumbar region: Secondary | ICD-10-CM | POA: Diagnosis not present

## 2015-07-04 DIAGNOSIS — M4800 Spinal stenosis, site unspecified: Secondary | ICD-10-CM | POA: Diagnosis not present

## 2015-07-04 DIAGNOSIS — G894 Chronic pain syndrome: Secondary | ICD-10-CM | POA: Diagnosis not present

## 2015-07-04 DIAGNOSIS — M5126 Other intervertebral disc displacement, lumbar region: Secondary | ICD-10-CM | POA: Diagnosis not present

## 2015-08-10 DIAGNOSIS — Z79891 Long term (current) use of opiate analgesic: Secondary | ICD-10-CM | POA: Diagnosis not present

## 2015-08-10 DIAGNOSIS — M5136 Other intervertebral disc degeneration, lumbar region: Secondary | ICD-10-CM | POA: Diagnosis not present

## 2015-08-10 DIAGNOSIS — E785 Hyperlipidemia, unspecified: Secondary | ICD-10-CM | POA: Diagnosis not present

## 2015-08-10 DIAGNOSIS — M5416 Radiculopathy, lumbar region: Secondary | ICD-10-CM | POA: Diagnosis not present

## 2015-08-10 DIAGNOSIS — M5126 Other intervertebral disc displacement, lumbar region: Secondary | ICD-10-CM | POA: Diagnosis not present

## 2015-08-10 DIAGNOSIS — Z79899 Other long term (current) drug therapy: Secondary | ICD-10-CM | POA: Diagnosis not present

## 2015-08-10 DIAGNOSIS — E1151 Type 2 diabetes mellitus with diabetic peripheral angiopathy without gangrene: Secondary | ICD-10-CM | POA: Diagnosis not present

## 2015-08-10 DIAGNOSIS — Z87891 Personal history of nicotine dependence: Secondary | ICD-10-CM | POA: Diagnosis not present

## 2015-08-10 DIAGNOSIS — I10 Essential (primary) hypertension: Secondary | ICD-10-CM | POA: Diagnosis not present

## 2015-08-10 DIAGNOSIS — M4800 Spinal stenosis, site unspecified: Secondary | ICD-10-CM | POA: Diagnosis not present

## 2015-08-10 DIAGNOSIS — Z794 Long term (current) use of insulin: Secondary | ICD-10-CM | POA: Diagnosis not present

## 2015-08-10 DIAGNOSIS — G894 Chronic pain syndrome: Secondary | ICD-10-CM | POA: Diagnosis not present

## 2015-08-10 DIAGNOSIS — M4806 Spinal stenosis, lumbar region: Secondary | ICD-10-CM | POA: Diagnosis not present

## 2015-09-07 DIAGNOSIS — G894 Chronic pain syndrome: Secondary | ICD-10-CM | POA: Diagnosis not present

## 2015-09-07 DIAGNOSIS — M5106 Intervertebral disc disorders with myelopathy, lumbar region: Secondary | ICD-10-CM | POA: Diagnosis not present

## 2015-09-07 DIAGNOSIS — M5126 Other intervertebral disc displacement, lumbar region: Secondary | ICD-10-CM | POA: Diagnosis not present

## 2015-09-07 DIAGNOSIS — M5136 Other intervertebral disc degeneration, lumbar region: Secondary | ICD-10-CM | POA: Diagnosis not present

## 2015-09-07 DIAGNOSIS — M5416 Radiculopathy, lumbar region: Secondary | ICD-10-CM | POA: Diagnosis not present

## 2015-09-07 DIAGNOSIS — M4800 Spinal stenosis, site unspecified: Secondary | ICD-10-CM | POA: Diagnosis not present

## 2015-09-10 DIAGNOSIS — M5417 Radiculopathy, lumbosacral region: Secondary | ICD-10-CM

## 2015-09-10 HISTORY — DX: Radiculopathy, lumbosacral region: M54.17

## 2015-10-03 DIAGNOSIS — E785 Hyperlipidemia, unspecified: Secondary | ICD-10-CM | POA: Diagnosis not present

## 2015-10-03 DIAGNOSIS — E1165 Type 2 diabetes mellitus with hyperglycemia: Secondary | ICD-10-CM | POA: Diagnosis not present

## 2015-10-03 DIAGNOSIS — Z79899 Other long term (current) drug therapy: Secondary | ICD-10-CM | POA: Diagnosis not present

## 2015-10-03 DIAGNOSIS — G4700 Insomnia, unspecified: Secondary | ICD-10-CM | POA: Diagnosis not present

## 2015-10-03 DIAGNOSIS — I10 Essential (primary) hypertension: Secondary | ICD-10-CM | POA: Diagnosis not present

## 2015-10-03 DIAGNOSIS — G629 Polyneuropathy, unspecified: Secondary | ICD-10-CM | POA: Diagnosis not present

## 2015-10-16 DIAGNOSIS — M5416 Radiculopathy, lumbar region: Secondary | ICD-10-CM | POA: Diagnosis not present

## 2015-10-16 DIAGNOSIS — M5126 Other intervertebral disc displacement, lumbar region: Secondary | ICD-10-CM | POA: Diagnosis not present

## 2015-10-16 DIAGNOSIS — M4800 Spinal stenosis, site unspecified: Secondary | ICD-10-CM | POA: Diagnosis not present

## 2015-10-16 DIAGNOSIS — M5136 Other intervertebral disc degeneration, lumbar region: Secondary | ICD-10-CM | POA: Diagnosis not present

## 2015-10-16 DIAGNOSIS — G894 Chronic pain syndrome: Secondary | ICD-10-CM | POA: Diagnosis not present

## 2015-10-18 DIAGNOSIS — M25552 Pain in left hip: Secondary | ICD-10-CM | POA: Diagnosis not present

## 2015-10-18 DIAGNOSIS — Z79899 Other long term (current) drug therapy: Secondary | ICD-10-CM | POA: Diagnosis not present

## 2015-10-18 DIAGNOSIS — I10 Essential (primary) hypertension: Secondary | ICD-10-CM | POA: Diagnosis not present

## 2015-10-18 DIAGNOSIS — M5417 Radiculopathy, lumbosacral region: Secondary | ICD-10-CM | POA: Diagnosis not present

## 2015-10-18 DIAGNOSIS — E1151 Type 2 diabetes mellitus with diabetic peripheral angiopathy without gangrene: Secondary | ICD-10-CM | POA: Diagnosis not present

## 2015-10-18 DIAGNOSIS — E785 Hyperlipidemia, unspecified: Secondary | ICD-10-CM | POA: Diagnosis not present

## 2015-10-18 DIAGNOSIS — M4806 Spinal stenosis, lumbar region: Secondary | ICD-10-CM | POA: Diagnosis not present

## 2015-10-18 DIAGNOSIS — Z87891 Personal history of nicotine dependence: Secondary | ICD-10-CM | POA: Diagnosis not present

## 2015-10-18 DIAGNOSIS — M25551 Pain in right hip: Secondary | ICD-10-CM | POA: Diagnosis not present

## 2015-10-18 DIAGNOSIS — G894 Chronic pain syndrome: Secondary | ICD-10-CM | POA: Diagnosis not present

## 2015-10-18 DIAGNOSIS — Z6838 Body mass index (BMI) 38.0-38.9, adult: Secondary | ICD-10-CM | POA: Diagnosis not present

## 2015-10-18 DIAGNOSIS — Z794 Long term (current) use of insulin: Secondary | ICD-10-CM | POA: Diagnosis not present

## 2015-10-18 DIAGNOSIS — M5126 Other intervertebral disc displacement, lumbar region: Secondary | ICD-10-CM | POA: Diagnosis not present

## 2015-10-18 DIAGNOSIS — M5117 Intervertebral disc disorders with radiculopathy, lumbosacral region: Secondary | ICD-10-CM | POA: Diagnosis not present

## 2015-11-29 DIAGNOSIS — L299 Pruritus, unspecified: Secondary | ICD-10-CM | POA: Diagnosis not present

## 2015-11-29 DIAGNOSIS — M549 Dorsalgia, unspecified: Secondary | ICD-10-CM | POA: Diagnosis not present

## 2015-11-29 DIAGNOSIS — E1165 Type 2 diabetes mellitus with hyperglycemia: Secondary | ICD-10-CM | POA: Diagnosis not present

## 2015-12-05 DIAGNOSIS — G894 Chronic pain syndrome: Secondary | ICD-10-CM | POA: Diagnosis not present

## 2015-12-05 DIAGNOSIS — M545 Low back pain: Secondary | ICD-10-CM | POA: Diagnosis not present

## 2015-12-05 DIAGNOSIS — M5126 Other intervertebral disc displacement, lumbar region: Secondary | ICD-10-CM | POA: Diagnosis not present

## 2015-12-05 DIAGNOSIS — M5416 Radiculopathy, lumbar region: Secondary | ICD-10-CM | POA: Diagnosis not present

## 2015-12-05 DIAGNOSIS — M5136 Other intervertebral disc degeneration, lumbar region: Secondary | ICD-10-CM | POA: Diagnosis not present

## 2016-01-02 DIAGNOSIS — M5136 Other intervertebral disc degeneration, lumbar region: Secondary | ICD-10-CM | POA: Diagnosis not present

## 2016-01-02 DIAGNOSIS — M5126 Other intervertebral disc displacement, lumbar region: Secondary | ICD-10-CM | POA: Diagnosis not present

## 2016-01-02 DIAGNOSIS — E1136 Type 2 diabetes mellitus with diabetic cataract: Secondary | ICD-10-CM | POA: Diagnosis not present

## 2016-01-02 DIAGNOSIS — M5116 Intervertebral disc disorders with radiculopathy, lumbar region: Secondary | ICD-10-CM | POA: Diagnosis not present

## 2016-01-02 DIAGNOSIS — E11618 Type 2 diabetes mellitus with other diabetic arthropathy: Secondary | ICD-10-CM | POA: Diagnosis not present

## 2016-01-02 DIAGNOSIS — I10 Essential (primary) hypertension: Secondary | ICD-10-CM | POA: Diagnosis not present

## 2016-01-02 DIAGNOSIS — G894 Chronic pain syndrome: Secondary | ICD-10-CM | POA: Diagnosis not present

## 2016-01-02 DIAGNOSIS — M5416 Radiculopathy, lumbar region: Secondary | ICD-10-CM | POA: Diagnosis not present

## 2016-01-16 DIAGNOSIS — G894 Chronic pain syndrome: Secondary | ICD-10-CM | POA: Diagnosis not present

## 2016-01-21 DIAGNOSIS — S90852A Superficial foreign body, left foot, initial encounter: Secondary | ICD-10-CM | POA: Diagnosis not present

## 2016-01-21 DIAGNOSIS — L089 Local infection of the skin and subcutaneous tissue, unspecified: Secondary | ICD-10-CM | POA: Diagnosis not present

## 2016-01-21 DIAGNOSIS — E1142 Type 2 diabetes mellitus with diabetic polyneuropathy: Secondary | ICD-10-CM | POA: Diagnosis not present

## 2016-01-21 DIAGNOSIS — L03116 Cellulitis of left lower limb: Secondary | ICD-10-CM | POA: Diagnosis not present

## 2016-01-21 HISTORY — DX: Local infection of the skin and subcutaneous tissue, unspecified: L08.9

## 2016-01-21 HISTORY — DX: Type 2 diabetes mellitus with diabetic polyneuropathy: E11.42

## 2016-01-26 DIAGNOSIS — E860 Dehydration: Secondary | ICD-10-CM | POA: Diagnosis not present

## 2016-01-26 DIAGNOSIS — R112 Nausea with vomiting, unspecified: Secondary | ICD-10-CM | POA: Diagnosis not present

## 2016-01-26 DIAGNOSIS — R079 Chest pain, unspecified: Secondary | ICD-10-CM | POA: Diagnosis not present

## 2016-02-15 DIAGNOSIS — Z87891 Personal history of nicotine dependence: Secondary | ICD-10-CM | POA: Diagnosis not present

## 2016-02-15 DIAGNOSIS — Z79899 Other long term (current) drug therapy: Secondary | ICD-10-CM | POA: Diagnosis not present

## 2016-02-15 DIAGNOSIS — Z89422 Acquired absence of other left toe(s): Secondary | ICD-10-CM | POA: Diagnosis not present

## 2016-02-15 DIAGNOSIS — Z89421 Acquired absence of other right toe(s): Secondary | ICD-10-CM | POA: Diagnosis not present

## 2016-02-15 DIAGNOSIS — Z794 Long term (current) use of insulin: Secondary | ICD-10-CM | POA: Diagnosis not present

## 2016-02-15 DIAGNOSIS — E11618 Type 2 diabetes mellitus with other diabetic arthropathy: Secondary | ICD-10-CM | POA: Diagnosis not present

## 2016-02-15 DIAGNOSIS — Z89412 Acquired absence of left great toe: Secondary | ICD-10-CM | POA: Diagnosis not present

## 2016-02-15 DIAGNOSIS — L97529 Non-pressure chronic ulcer of other part of left foot with unspecified severity: Secondary | ICD-10-CM | POA: Diagnosis not present

## 2016-02-15 DIAGNOSIS — R936 Abnormal findings on diagnostic imaging of limbs: Secondary | ICD-10-CM | POA: Diagnosis not present

## 2016-02-15 DIAGNOSIS — E11621 Type 2 diabetes mellitus with foot ulcer: Secondary | ICD-10-CM | POA: Diagnosis not present

## 2016-02-15 DIAGNOSIS — I10 Essential (primary) hypertension: Secondary | ICD-10-CM | POA: Diagnosis not present

## 2016-02-16 DIAGNOSIS — R936 Abnormal findings on diagnostic imaging of limbs: Secondary | ICD-10-CM | POA: Diagnosis not present

## 2016-03-14 DIAGNOSIS — E1151 Type 2 diabetes mellitus with diabetic peripheral angiopathy without gangrene: Secondary | ICD-10-CM | POA: Diagnosis not present

## 2016-03-14 DIAGNOSIS — E1142 Type 2 diabetes mellitus with diabetic polyneuropathy: Secondary | ICD-10-CM | POA: Diagnosis not present

## 2016-03-14 DIAGNOSIS — T8744 Infection of amputation stump, left lower extremity: Secondary | ICD-10-CM | POA: Diagnosis not present

## 2016-03-14 DIAGNOSIS — Z89412 Acquired absence of left great toe: Secondary | ICD-10-CM | POA: Diagnosis not present

## 2016-03-14 DIAGNOSIS — E08622 Diabetes mellitus due to underlying condition with other skin ulcer: Secondary | ICD-10-CM | POA: Diagnosis not present

## 2016-03-14 DIAGNOSIS — Z79899 Other long term (current) drug therapy: Secondary | ICD-10-CM | POA: Diagnosis not present

## 2016-03-14 DIAGNOSIS — Z89421 Acquired absence of other right toe(s): Secondary | ICD-10-CM | POA: Diagnosis not present

## 2016-03-14 DIAGNOSIS — Z23 Encounter for immunization: Secondary | ICD-10-CM | POA: Diagnosis not present

## 2016-03-14 DIAGNOSIS — E11621 Type 2 diabetes mellitus with foot ulcer: Secondary | ICD-10-CM | POA: Diagnosis not present

## 2016-03-14 DIAGNOSIS — T8789 Other complications of amputation stump: Secondary | ICD-10-CM | POA: Diagnosis not present

## 2016-03-14 DIAGNOSIS — M5136 Other intervertebral disc degeneration, lumbar region: Secondary | ICD-10-CM | POA: Diagnosis not present

## 2016-03-14 DIAGNOSIS — E119 Type 2 diabetes mellitus without complications: Secondary | ICD-10-CM | POA: Diagnosis not present

## 2016-03-14 DIAGNOSIS — M869 Osteomyelitis, unspecified: Secondary | ICD-10-CM | POA: Diagnosis not present

## 2016-03-14 DIAGNOSIS — E1165 Type 2 diabetes mellitus with hyperglycemia: Secondary | ICD-10-CM | POA: Diagnosis not present

## 2016-03-14 DIAGNOSIS — L97521 Non-pressure chronic ulcer of other part of left foot limited to breakdown of skin: Secondary | ICD-10-CM | POA: Diagnosis not present

## 2016-03-14 DIAGNOSIS — L02612 Cutaneous abscess of left foot: Secondary | ICD-10-CM | POA: Diagnosis not present

## 2016-03-14 DIAGNOSIS — L03116 Cellulitis of left lower limb: Secondary | ICD-10-CM | POA: Diagnosis not present

## 2016-03-14 DIAGNOSIS — E785 Hyperlipidemia, unspecified: Secondary | ICD-10-CM | POA: Diagnosis not present

## 2016-03-14 DIAGNOSIS — Z794 Long term (current) use of insulin: Secondary | ICD-10-CM | POA: Diagnosis not present

## 2016-03-14 DIAGNOSIS — M868X7 Other osteomyelitis, ankle and foot: Secondary | ICD-10-CM | POA: Diagnosis not present

## 2016-03-14 DIAGNOSIS — L97502 Non-pressure chronic ulcer of other part of unspecified foot with fat layer exposed: Secondary | ICD-10-CM | POA: Diagnosis not present

## 2016-03-14 DIAGNOSIS — Z89422 Acquired absence of other left toe(s): Secondary | ICD-10-CM | POA: Diagnosis not present

## 2016-03-14 DIAGNOSIS — Z87891 Personal history of nicotine dependence: Secondary | ICD-10-CM | POA: Diagnosis not present

## 2016-03-14 DIAGNOSIS — E1169 Type 2 diabetes mellitus with other specified complication: Secondary | ICD-10-CM | POA: Diagnosis not present

## 2016-03-14 DIAGNOSIS — E08621 Diabetes mellitus due to underlying condition with foot ulcer: Secondary | ICD-10-CM | POA: Diagnosis not present

## 2016-03-14 DIAGNOSIS — L97524 Non-pressure chronic ulcer of other part of left foot with necrosis of bone: Secondary | ICD-10-CM | POA: Diagnosis not present

## 2016-03-14 DIAGNOSIS — M79672 Pain in left foot: Secondary | ICD-10-CM | POA: Diagnosis not present

## 2016-03-14 DIAGNOSIS — I10 Essential (primary) hypertension: Secondary | ICD-10-CM | POA: Diagnosis not present

## 2016-03-14 DIAGNOSIS — Z452 Encounter for adjustment and management of vascular access device: Secondary | ICD-10-CM | POA: Diagnosis not present

## 2016-03-14 DIAGNOSIS — L089 Local infection of the skin and subcutaneous tissue, unspecified: Secondary | ICD-10-CM | POA: Diagnosis not present

## 2016-03-14 HISTORY — DX: Type 2 diabetes mellitus with foot ulcer: E11.621

## 2016-03-14 HISTORY — DX: Type 2 diabetes mellitus with foot ulcer: L97.524

## 2016-03-17 DIAGNOSIS — L02612 Cutaneous abscess of left foot: Secondary | ICD-10-CM

## 2016-03-17 HISTORY — DX: Cutaneous abscess of left foot: L02.612

## 2016-03-19 DIAGNOSIS — L02612 Cutaneous abscess of left foot: Secondary | ICD-10-CM | POA: Diagnosis not present

## 2016-03-19 DIAGNOSIS — E11621 Type 2 diabetes mellitus with foot ulcer: Secondary | ICD-10-CM | POA: Diagnosis not present

## 2016-03-19 DIAGNOSIS — Z23 Encounter for immunization: Secondary | ICD-10-CM | POA: Diagnosis not present

## 2016-03-19 DIAGNOSIS — L97524 Non-pressure chronic ulcer of other part of left foot with necrosis of bone: Secondary | ICD-10-CM | POA: Diagnosis not present

## 2016-03-21 DIAGNOSIS — M869 Osteomyelitis, unspecified: Secondary | ICD-10-CM | POA: Diagnosis not present

## 2016-03-21 DIAGNOSIS — Z5181 Encounter for therapeutic drug level monitoring: Secondary | ICD-10-CM | POA: Diagnosis not present

## 2016-03-21 DIAGNOSIS — E1169 Type 2 diabetes mellitus with other specified complication: Secondary | ICD-10-CM | POA: Diagnosis not present

## 2016-03-21 DIAGNOSIS — E1165 Type 2 diabetes mellitus with hyperglycemia: Secondary | ICD-10-CM | POA: Diagnosis not present

## 2016-03-21 DIAGNOSIS — L97524 Non-pressure chronic ulcer of other part of left foot with necrosis of bone: Secondary | ICD-10-CM | POA: Diagnosis not present

## 2016-03-21 DIAGNOSIS — Z452 Encounter for adjustment and management of vascular access device: Secondary | ICD-10-CM | POA: Diagnosis not present

## 2016-03-21 DIAGNOSIS — E1151 Type 2 diabetes mellitus with diabetic peripheral angiopathy without gangrene: Secondary | ICD-10-CM | POA: Diagnosis not present

## 2016-03-21 DIAGNOSIS — E1140 Type 2 diabetes mellitus with diabetic neuropathy, unspecified: Secondary | ICD-10-CM | POA: Diagnosis not present

## 2016-03-21 DIAGNOSIS — L02612 Cutaneous abscess of left foot: Secondary | ICD-10-CM | POA: Diagnosis not present

## 2016-03-21 DIAGNOSIS — I10 Essential (primary) hypertension: Secondary | ICD-10-CM | POA: Diagnosis not present

## 2016-03-21 DIAGNOSIS — E11621 Type 2 diabetes mellitus with foot ulcer: Secondary | ICD-10-CM | POA: Diagnosis not present

## 2016-03-21 DIAGNOSIS — L03032 Cellulitis of left toe: Secondary | ICD-10-CM | POA: Diagnosis not present

## 2016-03-21 DIAGNOSIS — Z792 Long term (current) use of antibiotics: Secondary | ICD-10-CM | POA: Diagnosis not present

## 2016-03-24 DIAGNOSIS — E1142 Type 2 diabetes mellitus with diabetic polyneuropathy: Secondary | ICD-10-CM | POA: Diagnosis not present

## 2016-03-24 DIAGNOSIS — Z79899 Other long term (current) drug therapy: Secondary | ICD-10-CM | POA: Diagnosis not present

## 2016-03-24 DIAGNOSIS — L0291 Cutaneous abscess, unspecified: Secondary | ICD-10-CM | POA: Diagnosis not present

## 2016-03-24 DIAGNOSIS — L97523 Non-pressure chronic ulcer of other part of left foot with necrosis of muscle: Secondary | ICD-10-CM | POA: Diagnosis not present

## 2016-03-24 DIAGNOSIS — E1165 Type 2 diabetes mellitus with hyperglycemia: Secondary | ICD-10-CM | POA: Diagnosis not present

## 2016-03-27 DIAGNOSIS — I10 Essential (primary) hypertension: Secondary | ICD-10-CM | POA: Diagnosis not present

## 2016-03-27 DIAGNOSIS — E1151 Type 2 diabetes mellitus with diabetic peripheral angiopathy without gangrene: Secondary | ICD-10-CM | POA: Diagnosis not present

## 2016-03-27 DIAGNOSIS — E11621 Type 2 diabetes mellitus with foot ulcer: Secondary | ICD-10-CM | POA: Diagnosis not present

## 2016-03-27 DIAGNOSIS — L97524 Non-pressure chronic ulcer of other part of left foot with necrosis of bone: Secondary | ICD-10-CM | POA: Diagnosis not present

## 2016-03-27 DIAGNOSIS — Z792 Long term (current) use of antibiotics: Secondary | ICD-10-CM | POA: Diagnosis not present

## 2016-03-27 DIAGNOSIS — E1169 Type 2 diabetes mellitus with other specified complication: Secondary | ICD-10-CM | POA: Diagnosis not present

## 2016-03-27 DIAGNOSIS — E1140 Type 2 diabetes mellitus with diabetic neuropathy, unspecified: Secondary | ICD-10-CM | POA: Diagnosis not present

## 2016-03-27 DIAGNOSIS — L03032 Cellulitis of left toe: Secondary | ICD-10-CM | POA: Diagnosis not present

## 2016-03-27 DIAGNOSIS — L02612 Cutaneous abscess of left foot: Secondary | ICD-10-CM | POA: Diagnosis not present

## 2016-03-27 DIAGNOSIS — Z5181 Encounter for therapeutic drug level monitoring: Secondary | ICD-10-CM | POA: Diagnosis not present

## 2016-03-27 DIAGNOSIS — M869 Osteomyelitis, unspecified: Secondary | ICD-10-CM | POA: Diagnosis not present

## 2016-03-27 DIAGNOSIS — Z452 Encounter for adjustment and management of vascular access device: Secondary | ICD-10-CM | POA: Diagnosis not present

## 2016-03-27 DIAGNOSIS — E1165 Type 2 diabetes mellitus with hyperglycemia: Secondary | ICD-10-CM | POA: Diagnosis not present

## 2016-03-28 DIAGNOSIS — E1169 Type 2 diabetes mellitus with other specified complication: Secondary | ICD-10-CM | POA: Diagnosis not present

## 2016-03-28 DIAGNOSIS — E1140 Type 2 diabetes mellitus with diabetic neuropathy, unspecified: Secondary | ICD-10-CM | POA: Diagnosis not present

## 2016-03-28 DIAGNOSIS — L03032 Cellulitis of left toe: Secondary | ICD-10-CM | POA: Diagnosis not present

## 2016-03-28 DIAGNOSIS — L97524 Non-pressure chronic ulcer of other part of left foot with necrosis of bone: Secondary | ICD-10-CM | POA: Diagnosis not present

## 2016-03-28 DIAGNOSIS — L02612 Cutaneous abscess of left foot: Secondary | ICD-10-CM | POA: Diagnosis not present

## 2016-03-28 DIAGNOSIS — I10 Essential (primary) hypertension: Secondary | ICD-10-CM | POA: Diagnosis not present

## 2016-03-28 DIAGNOSIS — E1165 Type 2 diabetes mellitus with hyperglycemia: Secondary | ICD-10-CM | POA: Diagnosis not present

## 2016-03-28 DIAGNOSIS — Z452 Encounter for adjustment and management of vascular access device: Secondary | ICD-10-CM | POA: Diagnosis not present

## 2016-03-28 DIAGNOSIS — Z5181 Encounter for therapeutic drug level monitoring: Secondary | ICD-10-CM | POA: Diagnosis not present

## 2016-03-28 DIAGNOSIS — E1151 Type 2 diabetes mellitus with diabetic peripheral angiopathy without gangrene: Secondary | ICD-10-CM | POA: Diagnosis not present

## 2016-03-28 DIAGNOSIS — Z792 Long term (current) use of antibiotics: Secondary | ICD-10-CM | POA: Diagnosis not present

## 2016-03-28 DIAGNOSIS — M869 Osteomyelitis, unspecified: Secondary | ICD-10-CM | POA: Diagnosis not present

## 2016-03-28 DIAGNOSIS — E11621 Type 2 diabetes mellitus with foot ulcer: Secondary | ICD-10-CM | POA: Diagnosis not present

## 2016-03-31 DIAGNOSIS — E1169 Type 2 diabetes mellitus with other specified complication: Secondary | ICD-10-CM | POA: Diagnosis not present

## 2016-03-31 DIAGNOSIS — M869 Osteomyelitis, unspecified: Secondary | ICD-10-CM | POA: Diagnosis not present

## 2016-03-31 DIAGNOSIS — Z452 Encounter for adjustment and management of vascular access device: Secondary | ICD-10-CM | POA: Diagnosis not present

## 2016-03-31 DIAGNOSIS — E1151 Type 2 diabetes mellitus with diabetic peripheral angiopathy without gangrene: Secondary | ICD-10-CM | POA: Diagnosis not present

## 2016-03-31 DIAGNOSIS — Z792 Long term (current) use of antibiotics: Secondary | ICD-10-CM | POA: Diagnosis not present

## 2016-03-31 DIAGNOSIS — E11621 Type 2 diabetes mellitus with foot ulcer: Secondary | ICD-10-CM | POA: Diagnosis not present

## 2016-03-31 DIAGNOSIS — L02612 Cutaneous abscess of left foot: Secondary | ICD-10-CM | POA: Diagnosis not present

## 2016-03-31 DIAGNOSIS — I10 Essential (primary) hypertension: Secondary | ICD-10-CM | POA: Diagnosis not present

## 2016-03-31 DIAGNOSIS — L97524 Non-pressure chronic ulcer of other part of left foot with necrosis of bone: Secondary | ICD-10-CM | POA: Diagnosis not present

## 2016-03-31 DIAGNOSIS — Z5181 Encounter for therapeutic drug level monitoring: Secondary | ICD-10-CM | POA: Diagnosis not present

## 2016-03-31 DIAGNOSIS — E1165 Type 2 diabetes mellitus with hyperglycemia: Secondary | ICD-10-CM | POA: Diagnosis not present

## 2016-03-31 DIAGNOSIS — E1140 Type 2 diabetes mellitus with diabetic neuropathy, unspecified: Secondary | ICD-10-CM | POA: Diagnosis not present

## 2016-03-31 DIAGNOSIS — L03032 Cellulitis of left toe: Secondary | ICD-10-CM | POA: Diagnosis not present

## 2016-04-01 DIAGNOSIS — L03032 Cellulitis of left toe: Secondary | ICD-10-CM | POA: Diagnosis not present

## 2016-04-01 DIAGNOSIS — E1169 Type 2 diabetes mellitus with other specified complication: Secondary | ICD-10-CM | POA: Diagnosis not present

## 2016-04-01 DIAGNOSIS — Z792 Long term (current) use of antibiotics: Secondary | ICD-10-CM | POA: Diagnosis not present

## 2016-04-01 DIAGNOSIS — E11621 Type 2 diabetes mellitus with foot ulcer: Secondary | ICD-10-CM | POA: Diagnosis not present

## 2016-04-01 DIAGNOSIS — Z452 Encounter for adjustment and management of vascular access device: Secondary | ICD-10-CM | POA: Diagnosis not present

## 2016-04-01 DIAGNOSIS — L97524 Non-pressure chronic ulcer of other part of left foot with necrosis of bone: Secondary | ICD-10-CM | POA: Diagnosis not present

## 2016-04-01 DIAGNOSIS — L02612 Cutaneous abscess of left foot: Secondary | ICD-10-CM | POA: Diagnosis not present

## 2016-04-01 DIAGNOSIS — E1140 Type 2 diabetes mellitus with diabetic neuropathy, unspecified: Secondary | ICD-10-CM | POA: Diagnosis not present

## 2016-04-01 DIAGNOSIS — I10 Essential (primary) hypertension: Secondary | ICD-10-CM | POA: Diagnosis not present

## 2016-04-01 DIAGNOSIS — E1151 Type 2 diabetes mellitus with diabetic peripheral angiopathy without gangrene: Secondary | ICD-10-CM | POA: Diagnosis not present

## 2016-04-01 DIAGNOSIS — M869 Osteomyelitis, unspecified: Secondary | ICD-10-CM | POA: Diagnosis not present

## 2016-04-01 DIAGNOSIS — E1165 Type 2 diabetes mellitus with hyperglycemia: Secondary | ICD-10-CM | POA: Diagnosis not present

## 2016-04-01 DIAGNOSIS — Z5181 Encounter for therapeutic drug level monitoring: Secondary | ICD-10-CM | POA: Diagnosis not present

## 2016-04-02 DIAGNOSIS — Z452 Encounter for adjustment and management of vascular access device: Secondary | ICD-10-CM | POA: Diagnosis not present

## 2016-04-02 DIAGNOSIS — Z792 Long term (current) use of antibiotics: Secondary | ICD-10-CM | POA: Diagnosis not present

## 2016-04-02 DIAGNOSIS — L97524 Non-pressure chronic ulcer of other part of left foot with necrosis of bone: Secondary | ICD-10-CM | POA: Diagnosis not present

## 2016-04-02 DIAGNOSIS — E1151 Type 2 diabetes mellitus with diabetic peripheral angiopathy without gangrene: Secondary | ICD-10-CM | POA: Diagnosis not present

## 2016-04-02 DIAGNOSIS — M869 Osteomyelitis, unspecified: Secondary | ICD-10-CM | POA: Diagnosis not present

## 2016-04-02 DIAGNOSIS — E1169 Type 2 diabetes mellitus with other specified complication: Secondary | ICD-10-CM | POA: Diagnosis not present

## 2016-04-02 DIAGNOSIS — E1140 Type 2 diabetes mellitus with diabetic neuropathy, unspecified: Secondary | ICD-10-CM | POA: Diagnosis not present

## 2016-04-02 DIAGNOSIS — L02612 Cutaneous abscess of left foot: Secondary | ICD-10-CM | POA: Diagnosis not present

## 2016-04-02 DIAGNOSIS — E11621 Type 2 diabetes mellitus with foot ulcer: Secondary | ICD-10-CM | POA: Diagnosis not present

## 2016-04-02 DIAGNOSIS — E1165 Type 2 diabetes mellitus with hyperglycemia: Secondary | ICD-10-CM | POA: Diagnosis not present

## 2016-04-02 DIAGNOSIS — I10 Essential (primary) hypertension: Secondary | ICD-10-CM | POA: Diagnosis not present

## 2016-04-02 DIAGNOSIS — L03032 Cellulitis of left toe: Secondary | ICD-10-CM | POA: Diagnosis not present

## 2016-04-02 DIAGNOSIS — Z5181 Encounter for therapeutic drug level monitoring: Secondary | ICD-10-CM | POA: Diagnosis not present

## 2016-04-03 DIAGNOSIS — L03032 Cellulitis of left toe: Secondary | ICD-10-CM | POA: Diagnosis not present

## 2016-04-03 DIAGNOSIS — L02612 Cutaneous abscess of left foot: Secondary | ICD-10-CM | POA: Diagnosis not present

## 2016-04-03 DIAGNOSIS — E11621 Type 2 diabetes mellitus with foot ulcer: Secondary | ICD-10-CM | POA: Diagnosis not present

## 2016-04-03 DIAGNOSIS — E1165 Type 2 diabetes mellitus with hyperglycemia: Secondary | ICD-10-CM | POA: Diagnosis not present

## 2016-04-03 DIAGNOSIS — E1169 Type 2 diabetes mellitus with other specified complication: Secondary | ICD-10-CM | POA: Diagnosis not present

## 2016-04-03 DIAGNOSIS — M869 Osteomyelitis, unspecified: Secondary | ICD-10-CM | POA: Diagnosis not present

## 2016-04-03 DIAGNOSIS — Z792 Long term (current) use of antibiotics: Secondary | ICD-10-CM | POA: Diagnosis not present

## 2016-04-03 DIAGNOSIS — I10 Essential (primary) hypertension: Secondary | ICD-10-CM | POA: Diagnosis not present

## 2016-04-03 DIAGNOSIS — E1151 Type 2 diabetes mellitus with diabetic peripheral angiopathy without gangrene: Secondary | ICD-10-CM | POA: Diagnosis not present

## 2016-04-03 DIAGNOSIS — L97524 Non-pressure chronic ulcer of other part of left foot with necrosis of bone: Secondary | ICD-10-CM | POA: Diagnosis not present

## 2016-04-03 DIAGNOSIS — Z452 Encounter for adjustment and management of vascular access device: Secondary | ICD-10-CM | POA: Diagnosis not present

## 2016-04-03 DIAGNOSIS — Z5181 Encounter for therapeutic drug level monitoring: Secondary | ICD-10-CM | POA: Diagnosis not present

## 2016-04-03 DIAGNOSIS — E1140 Type 2 diabetes mellitus with diabetic neuropathy, unspecified: Secondary | ICD-10-CM | POA: Diagnosis not present

## 2016-04-05 DIAGNOSIS — Z5181 Encounter for therapeutic drug level monitoring: Secondary | ICD-10-CM | POA: Diagnosis not present

## 2016-04-05 DIAGNOSIS — E1151 Type 2 diabetes mellitus with diabetic peripheral angiopathy without gangrene: Secondary | ICD-10-CM | POA: Diagnosis not present

## 2016-04-05 DIAGNOSIS — M869 Osteomyelitis, unspecified: Secondary | ICD-10-CM | POA: Diagnosis not present

## 2016-04-05 DIAGNOSIS — I10 Essential (primary) hypertension: Secondary | ICD-10-CM | POA: Diagnosis not present

## 2016-04-05 DIAGNOSIS — E1165 Type 2 diabetes mellitus with hyperglycemia: Secondary | ICD-10-CM | POA: Diagnosis not present

## 2016-04-05 DIAGNOSIS — L97524 Non-pressure chronic ulcer of other part of left foot with necrosis of bone: Secondary | ICD-10-CM | POA: Diagnosis not present

## 2016-04-05 DIAGNOSIS — L03032 Cellulitis of left toe: Secondary | ICD-10-CM | POA: Diagnosis not present

## 2016-04-05 DIAGNOSIS — E11621 Type 2 diabetes mellitus with foot ulcer: Secondary | ICD-10-CM | POA: Diagnosis not present

## 2016-04-05 DIAGNOSIS — L02612 Cutaneous abscess of left foot: Secondary | ICD-10-CM | POA: Diagnosis not present

## 2016-04-05 DIAGNOSIS — E1169 Type 2 diabetes mellitus with other specified complication: Secondary | ICD-10-CM | POA: Diagnosis not present

## 2016-04-05 DIAGNOSIS — Z792 Long term (current) use of antibiotics: Secondary | ICD-10-CM | POA: Diagnosis not present

## 2016-04-05 DIAGNOSIS — Z452 Encounter for adjustment and management of vascular access device: Secondary | ICD-10-CM | POA: Diagnosis not present

## 2016-04-05 DIAGNOSIS — E1140 Type 2 diabetes mellitus with diabetic neuropathy, unspecified: Secondary | ICD-10-CM | POA: Diagnosis not present

## 2016-04-06 DIAGNOSIS — M869 Osteomyelitis, unspecified: Secondary | ICD-10-CM | POA: Diagnosis not present

## 2016-04-06 DIAGNOSIS — E1151 Type 2 diabetes mellitus with diabetic peripheral angiopathy without gangrene: Secondary | ICD-10-CM | POA: Diagnosis not present

## 2016-04-06 DIAGNOSIS — I10 Essential (primary) hypertension: Secondary | ICD-10-CM | POA: Diagnosis not present

## 2016-04-06 DIAGNOSIS — E11621 Type 2 diabetes mellitus with foot ulcer: Secondary | ICD-10-CM | POA: Diagnosis not present

## 2016-04-06 DIAGNOSIS — L03032 Cellulitis of left toe: Secondary | ICD-10-CM | POA: Diagnosis not present

## 2016-04-06 DIAGNOSIS — E1165 Type 2 diabetes mellitus with hyperglycemia: Secondary | ICD-10-CM | POA: Diagnosis not present

## 2016-04-06 DIAGNOSIS — L02612 Cutaneous abscess of left foot: Secondary | ICD-10-CM | POA: Diagnosis not present

## 2016-04-06 DIAGNOSIS — L97524 Non-pressure chronic ulcer of other part of left foot with necrosis of bone: Secondary | ICD-10-CM | POA: Diagnosis not present

## 2016-04-06 DIAGNOSIS — E1140 Type 2 diabetes mellitus with diabetic neuropathy, unspecified: Secondary | ICD-10-CM | POA: Diagnosis not present

## 2016-04-06 DIAGNOSIS — Z5181 Encounter for therapeutic drug level monitoring: Secondary | ICD-10-CM | POA: Diagnosis not present

## 2016-04-06 DIAGNOSIS — Z452 Encounter for adjustment and management of vascular access device: Secondary | ICD-10-CM | POA: Diagnosis not present

## 2016-04-06 DIAGNOSIS — Z792 Long term (current) use of antibiotics: Secondary | ICD-10-CM | POA: Diagnosis not present

## 2016-04-06 DIAGNOSIS — E1169 Type 2 diabetes mellitus with other specified complication: Secondary | ICD-10-CM | POA: Diagnosis not present

## 2016-04-07 DIAGNOSIS — T8789 Other complications of amputation stump: Secondary | ICD-10-CM | POA: Diagnosis not present

## 2016-04-07 DIAGNOSIS — L03116 Cellulitis of left lower limb: Secondary | ICD-10-CM | POA: Diagnosis not present

## 2016-04-07 DIAGNOSIS — E1142 Type 2 diabetes mellitus with diabetic polyneuropathy: Secondary | ICD-10-CM | POA: Diagnosis not present

## 2016-04-07 DIAGNOSIS — T8131XA Disruption of external operation (surgical) wound, not elsewhere classified, initial encounter: Secondary | ICD-10-CM | POA: Diagnosis not present

## 2016-04-08 DIAGNOSIS — E1169 Type 2 diabetes mellitus with other specified complication: Secondary | ICD-10-CM | POA: Diagnosis not present

## 2016-04-08 DIAGNOSIS — E1140 Type 2 diabetes mellitus with diabetic neuropathy, unspecified: Secondary | ICD-10-CM | POA: Diagnosis not present

## 2016-04-08 DIAGNOSIS — I739 Peripheral vascular disease, unspecified: Secondary | ICD-10-CM

## 2016-04-08 DIAGNOSIS — Z452 Encounter for adjustment and management of vascular access device: Secondary | ICD-10-CM | POA: Diagnosis not present

## 2016-04-08 DIAGNOSIS — E1165 Type 2 diabetes mellitus with hyperglycemia: Secondary | ICD-10-CM | POA: Diagnosis not present

## 2016-04-08 DIAGNOSIS — E1151 Type 2 diabetes mellitus with diabetic peripheral angiopathy without gangrene: Secondary | ICD-10-CM | POA: Diagnosis not present

## 2016-04-08 DIAGNOSIS — L97524 Non-pressure chronic ulcer of other part of left foot with necrosis of bone: Secondary | ICD-10-CM | POA: Diagnosis not present

## 2016-04-08 DIAGNOSIS — L02612 Cutaneous abscess of left foot: Secondary | ICD-10-CM | POA: Diagnosis not present

## 2016-04-08 DIAGNOSIS — G8918 Other acute postprocedural pain: Secondary | ICD-10-CM | POA: Diagnosis not present

## 2016-04-08 DIAGNOSIS — M869 Osteomyelitis, unspecified: Secondary | ICD-10-CM | POA: Diagnosis not present

## 2016-04-08 DIAGNOSIS — E785 Hyperlipidemia, unspecified: Secondary | ICD-10-CM

## 2016-04-08 DIAGNOSIS — L03032 Cellulitis of left toe: Secondary | ICD-10-CM | POA: Diagnosis not present

## 2016-04-08 DIAGNOSIS — Z5181 Encounter for therapeutic drug level monitoring: Secondary | ICD-10-CM | POA: Diagnosis not present

## 2016-04-08 DIAGNOSIS — Z792 Long term (current) use of antibiotics: Secondary | ICD-10-CM | POA: Diagnosis not present

## 2016-04-08 DIAGNOSIS — E11621 Type 2 diabetes mellitus with foot ulcer: Secondary | ICD-10-CM | POA: Diagnosis not present

## 2016-04-08 DIAGNOSIS — S91102A Unspecified open wound of left great toe without damage to nail, initial encounter: Secondary | ICD-10-CM | POA: Diagnosis not present

## 2016-04-08 DIAGNOSIS — I10 Essential (primary) hypertension: Secondary | ICD-10-CM | POA: Diagnosis not present

## 2016-04-08 HISTORY — DX: Peripheral vascular disease, unspecified: I73.9

## 2016-04-08 HISTORY — DX: Hyperlipidemia, unspecified: E78.5

## 2016-04-09 DIAGNOSIS — L97524 Non-pressure chronic ulcer of other part of left foot with necrosis of bone: Secondary | ICD-10-CM | POA: Diagnosis not present

## 2016-04-09 DIAGNOSIS — E11621 Type 2 diabetes mellitus with foot ulcer: Secondary | ICD-10-CM | POA: Diagnosis not present

## 2016-04-09 DIAGNOSIS — E1165 Type 2 diabetes mellitus with hyperglycemia: Secondary | ICD-10-CM | POA: Diagnosis not present

## 2016-04-09 DIAGNOSIS — Z792 Long term (current) use of antibiotics: Secondary | ICD-10-CM | POA: Diagnosis not present

## 2016-04-09 DIAGNOSIS — M869 Osteomyelitis, unspecified: Secondary | ICD-10-CM | POA: Diagnosis not present

## 2016-04-09 DIAGNOSIS — E1169 Type 2 diabetes mellitus with other specified complication: Secondary | ICD-10-CM | POA: Diagnosis not present

## 2016-04-09 DIAGNOSIS — E1151 Type 2 diabetes mellitus with diabetic peripheral angiopathy without gangrene: Secondary | ICD-10-CM | POA: Diagnosis not present

## 2016-04-09 DIAGNOSIS — Z452 Encounter for adjustment and management of vascular access device: Secondary | ICD-10-CM | POA: Diagnosis not present

## 2016-04-09 DIAGNOSIS — E1140 Type 2 diabetes mellitus with diabetic neuropathy, unspecified: Secondary | ICD-10-CM | POA: Diagnosis not present

## 2016-04-09 DIAGNOSIS — L03032 Cellulitis of left toe: Secondary | ICD-10-CM | POA: Diagnosis not present

## 2016-04-09 DIAGNOSIS — L02612 Cutaneous abscess of left foot: Secondary | ICD-10-CM | POA: Diagnosis not present

## 2016-04-09 DIAGNOSIS — Z5181 Encounter for therapeutic drug level monitoring: Secondary | ICD-10-CM | POA: Diagnosis not present

## 2016-04-09 DIAGNOSIS — I10 Essential (primary) hypertension: Secondary | ICD-10-CM | POA: Diagnosis not present

## 2016-04-10 DIAGNOSIS — E1165 Type 2 diabetes mellitus with hyperglycemia: Secondary | ICD-10-CM | POA: Diagnosis not present

## 2016-04-10 DIAGNOSIS — E1140 Type 2 diabetes mellitus with diabetic neuropathy, unspecified: Secondary | ICD-10-CM | POA: Diagnosis not present

## 2016-04-10 DIAGNOSIS — E11621 Type 2 diabetes mellitus with foot ulcer: Secondary | ICD-10-CM | POA: Diagnosis not present

## 2016-04-10 DIAGNOSIS — Z5181 Encounter for therapeutic drug level monitoring: Secondary | ICD-10-CM | POA: Diagnosis not present

## 2016-04-10 DIAGNOSIS — M869 Osteomyelitis, unspecified: Secondary | ICD-10-CM | POA: Diagnosis not present

## 2016-04-10 DIAGNOSIS — E1151 Type 2 diabetes mellitus with diabetic peripheral angiopathy without gangrene: Secondary | ICD-10-CM | POA: Diagnosis not present

## 2016-04-10 DIAGNOSIS — Z792 Long term (current) use of antibiotics: Secondary | ICD-10-CM | POA: Diagnosis not present

## 2016-04-10 DIAGNOSIS — L03032 Cellulitis of left toe: Secondary | ICD-10-CM | POA: Diagnosis not present

## 2016-04-10 DIAGNOSIS — I10 Essential (primary) hypertension: Secondary | ICD-10-CM | POA: Diagnosis not present

## 2016-04-10 DIAGNOSIS — L02612 Cutaneous abscess of left foot: Secondary | ICD-10-CM | POA: Diagnosis not present

## 2016-04-10 DIAGNOSIS — L97524 Non-pressure chronic ulcer of other part of left foot with necrosis of bone: Secondary | ICD-10-CM | POA: Diagnosis not present

## 2016-04-10 DIAGNOSIS — Z452 Encounter for adjustment and management of vascular access device: Secondary | ICD-10-CM | POA: Diagnosis not present

## 2016-04-10 DIAGNOSIS — E1169 Type 2 diabetes mellitus with other specified complication: Secondary | ICD-10-CM | POA: Diagnosis not present

## 2016-04-11 DIAGNOSIS — Z5181 Encounter for therapeutic drug level monitoring: Secondary | ICD-10-CM | POA: Diagnosis not present

## 2016-04-11 DIAGNOSIS — L97524 Non-pressure chronic ulcer of other part of left foot with necrosis of bone: Secondary | ICD-10-CM | POA: Diagnosis not present

## 2016-04-11 DIAGNOSIS — L02612 Cutaneous abscess of left foot: Secondary | ICD-10-CM | POA: Diagnosis not present

## 2016-04-11 DIAGNOSIS — M869 Osteomyelitis, unspecified: Secondary | ICD-10-CM | POA: Diagnosis not present

## 2016-04-11 DIAGNOSIS — E1169 Type 2 diabetes mellitus with other specified complication: Secondary | ICD-10-CM | POA: Diagnosis not present

## 2016-04-11 DIAGNOSIS — I10 Essential (primary) hypertension: Secondary | ICD-10-CM | POA: Diagnosis not present

## 2016-04-11 DIAGNOSIS — Z452 Encounter for adjustment and management of vascular access device: Secondary | ICD-10-CM | POA: Diagnosis not present

## 2016-04-11 DIAGNOSIS — E1165 Type 2 diabetes mellitus with hyperglycemia: Secondary | ICD-10-CM | POA: Diagnosis not present

## 2016-04-11 DIAGNOSIS — E1151 Type 2 diabetes mellitus with diabetic peripheral angiopathy without gangrene: Secondary | ICD-10-CM | POA: Diagnosis not present

## 2016-04-11 DIAGNOSIS — L03032 Cellulitis of left toe: Secondary | ICD-10-CM | POA: Diagnosis not present

## 2016-04-11 DIAGNOSIS — Z792 Long term (current) use of antibiotics: Secondary | ICD-10-CM | POA: Diagnosis not present

## 2016-04-11 DIAGNOSIS — E1140 Type 2 diabetes mellitus with diabetic neuropathy, unspecified: Secondary | ICD-10-CM | POA: Diagnosis not present

## 2016-04-11 DIAGNOSIS — E11621 Type 2 diabetes mellitus with foot ulcer: Secondary | ICD-10-CM | POA: Diagnosis not present

## 2016-04-12 DIAGNOSIS — Z452 Encounter for adjustment and management of vascular access device: Secondary | ICD-10-CM | POA: Diagnosis not present

## 2016-04-12 DIAGNOSIS — E11621 Type 2 diabetes mellitus with foot ulcer: Secondary | ICD-10-CM | POA: Diagnosis not present

## 2016-04-12 DIAGNOSIS — Z5181 Encounter for therapeutic drug level monitoring: Secondary | ICD-10-CM | POA: Diagnosis not present

## 2016-04-12 DIAGNOSIS — I10 Essential (primary) hypertension: Secondary | ICD-10-CM | POA: Diagnosis not present

## 2016-04-12 DIAGNOSIS — L97524 Non-pressure chronic ulcer of other part of left foot with necrosis of bone: Secondary | ICD-10-CM | POA: Diagnosis not present

## 2016-04-12 DIAGNOSIS — M869 Osteomyelitis, unspecified: Secondary | ICD-10-CM | POA: Diagnosis not present

## 2016-04-12 DIAGNOSIS — E1165 Type 2 diabetes mellitus with hyperglycemia: Secondary | ICD-10-CM | POA: Diagnosis not present

## 2016-04-12 DIAGNOSIS — E1169 Type 2 diabetes mellitus with other specified complication: Secondary | ICD-10-CM | POA: Diagnosis not present

## 2016-04-12 DIAGNOSIS — L03032 Cellulitis of left toe: Secondary | ICD-10-CM | POA: Diagnosis not present

## 2016-04-12 DIAGNOSIS — E1151 Type 2 diabetes mellitus with diabetic peripheral angiopathy without gangrene: Secondary | ICD-10-CM | POA: Diagnosis not present

## 2016-04-12 DIAGNOSIS — L02612 Cutaneous abscess of left foot: Secondary | ICD-10-CM | POA: Diagnosis not present

## 2016-04-12 DIAGNOSIS — E1140 Type 2 diabetes mellitus with diabetic neuropathy, unspecified: Secondary | ICD-10-CM | POA: Diagnosis not present

## 2016-04-12 DIAGNOSIS — Z792 Long term (current) use of antibiotics: Secondary | ICD-10-CM | POA: Diagnosis not present

## 2016-04-13 DIAGNOSIS — E1140 Type 2 diabetes mellitus with diabetic neuropathy, unspecified: Secondary | ICD-10-CM | POA: Diagnosis not present

## 2016-04-13 DIAGNOSIS — E1151 Type 2 diabetes mellitus with diabetic peripheral angiopathy without gangrene: Secondary | ICD-10-CM | POA: Diagnosis not present

## 2016-04-13 DIAGNOSIS — Z452 Encounter for adjustment and management of vascular access device: Secondary | ICD-10-CM | POA: Diagnosis not present

## 2016-04-13 DIAGNOSIS — E11621 Type 2 diabetes mellitus with foot ulcer: Secondary | ICD-10-CM | POA: Diagnosis not present

## 2016-04-13 DIAGNOSIS — M869 Osteomyelitis, unspecified: Secondary | ICD-10-CM | POA: Diagnosis not present

## 2016-04-13 DIAGNOSIS — I10 Essential (primary) hypertension: Secondary | ICD-10-CM | POA: Diagnosis not present

## 2016-04-13 DIAGNOSIS — L97524 Non-pressure chronic ulcer of other part of left foot with necrosis of bone: Secondary | ICD-10-CM | POA: Diagnosis not present

## 2016-04-13 DIAGNOSIS — Z792 Long term (current) use of antibiotics: Secondary | ICD-10-CM | POA: Diagnosis not present

## 2016-04-13 DIAGNOSIS — Z5181 Encounter for therapeutic drug level monitoring: Secondary | ICD-10-CM | POA: Diagnosis not present

## 2016-04-13 DIAGNOSIS — E1165 Type 2 diabetes mellitus with hyperglycemia: Secondary | ICD-10-CM | POA: Diagnosis not present

## 2016-04-13 DIAGNOSIS — L03032 Cellulitis of left toe: Secondary | ICD-10-CM | POA: Diagnosis not present

## 2016-04-13 DIAGNOSIS — L02612 Cutaneous abscess of left foot: Secondary | ICD-10-CM | POA: Diagnosis not present

## 2016-04-13 DIAGNOSIS — E1169 Type 2 diabetes mellitus with other specified complication: Secondary | ICD-10-CM | POA: Diagnosis not present

## 2016-04-14 DIAGNOSIS — E1165 Type 2 diabetes mellitus with hyperglycemia: Secondary | ICD-10-CM | POA: Diagnosis not present

## 2016-04-14 DIAGNOSIS — L03032 Cellulitis of left toe: Secondary | ICD-10-CM | POA: Diagnosis not present

## 2016-04-14 DIAGNOSIS — L97524 Non-pressure chronic ulcer of other part of left foot with necrosis of bone: Secondary | ICD-10-CM | POA: Diagnosis not present

## 2016-04-14 DIAGNOSIS — E1140 Type 2 diabetes mellitus with diabetic neuropathy, unspecified: Secondary | ICD-10-CM | POA: Diagnosis not present

## 2016-04-14 DIAGNOSIS — I10 Essential (primary) hypertension: Secondary | ICD-10-CM | POA: Diagnosis not present

## 2016-04-14 DIAGNOSIS — Z5181 Encounter for therapeutic drug level monitoring: Secondary | ICD-10-CM | POA: Diagnosis not present

## 2016-04-14 DIAGNOSIS — E1151 Type 2 diabetes mellitus with diabetic peripheral angiopathy without gangrene: Secondary | ICD-10-CM | POA: Diagnosis not present

## 2016-04-14 DIAGNOSIS — M869 Osteomyelitis, unspecified: Secondary | ICD-10-CM | POA: Diagnosis not present

## 2016-04-14 DIAGNOSIS — E11621 Type 2 diabetes mellitus with foot ulcer: Secondary | ICD-10-CM | POA: Diagnosis not present

## 2016-04-14 DIAGNOSIS — Z792 Long term (current) use of antibiotics: Secondary | ICD-10-CM | POA: Diagnosis not present

## 2016-04-14 DIAGNOSIS — L02612 Cutaneous abscess of left foot: Secondary | ICD-10-CM | POA: Diagnosis not present

## 2016-04-14 DIAGNOSIS — Z452 Encounter for adjustment and management of vascular access device: Secondary | ICD-10-CM | POA: Diagnosis not present

## 2016-04-14 DIAGNOSIS — E1169 Type 2 diabetes mellitus with other specified complication: Secondary | ICD-10-CM | POA: Diagnosis not present

## 2016-04-16 DIAGNOSIS — E1151 Type 2 diabetes mellitus with diabetic peripheral angiopathy without gangrene: Secondary | ICD-10-CM | POA: Diagnosis not present

## 2016-04-16 DIAGNOSIS — E11621 Type 2 diabetes mellitus with foot ulcer: Secondary | ICD-10-CM | POA: Diagnosis not present

## 2016-04-16 DIAGNOSIS — L02612 Cutaneous abscess of left foot: Secondary | ICD-10-CM | POA: Diagnosis not present

## 2016-04-16 DIAGNOSIS — E1165 Type 2 diabetes mellitus with hyperglycemia: Secondary | ICD-10-CM | POA: Diagnosis not present

## 2016-04-16 DIAGNOSIS — I10 Essential (primary) hypertension: Secondary | ICD-10-CM | POA: Diagnosis not present

## 2016-04-16 DIAGNOSIS — Z452 Encounter for adjustment and management of vascular access device: Secondary | ICD-10-CM | POA: Diagnosis not present

## 2016-04-16 DIAGNOSIS — E1140 Type 2 diabetes mellitus with diabetic neuropathy, unspecified: Secondary | ICD-10-CM | POA: Diagnosis not present

## 2016-04-16 DIAGNOSIS — L03032 Cellulitis of left toe: Secondary | ICD-10-CM | POA: Diagnosis not present

## 2016-04-16 DIAGNOSIS — L97524 Non-pressure chronic ulcer of other part of left foot with necrosis of bone: Secondary | ICD-10-CM | POA: Diagnosis not present

## 2016-04-16 DIAGNOSIS — E1169 Type 2 diabetes mellitus with other specified complication: Secondary | ICD-10-CM | POA: Diagnosis not present

## 2016-04-16 DIAGNOSIS — Z5181 Encounter for therapeutic drug level monitoring: Secondary | ICD-10-CM | POA: Diagnosis not present

## 2016-04-16 DIAGNOSIS — Z792 Long term (current) use of antibiotics: Secondary | ICD-10-CM | POA: Diagnosis not present

## 2016-04-16 DIAGNOSIS — M869 Osteomyelitis, unspecified: Secondary | ICD-10-CM | POA: Diagnosis not present

## 2016-04-17 DIAGNOSIS — E1140 Type 2 diabetes mellitus with diabetic neuropathy, unspecified: Secondary | ICD-10-CM | POA: Diagnosis not present

## 2016-04-17 DIAGNOSIS — Z792 Long term (current) use of antibiotics: Secondary | ICD-10-CM | POA: Diagnosis not present

## 2016-04-17 DIAGNOSIS — Z452 Encounter for adjustment and management of vascular access device: Secondary | ICD-10-CM | POA: Diagnosis not present

## 2016-04-17 DIAGNOSIS — I10 Essential (primary) hypertension: Secondary | ICD-10-CM | POA: Diagnosis not present

## 2016-04-17 DIAGNOSIS — L03032 Cellulitis of left toe: Secondary | ICD-10-CM | POA: Diagnosis not present

## 2016-04-17 DIAGNOSIS — L97524 Non-pressure chronic ulcer of other part of left foot with necrosis of bone: Secondary | ICD-10-CM | POA: Diagnosis not present

## 2016-04-17 DIAGNOSIS — E11621 Type 2 diabetes mellitus with foot ulcer: Secondary | ICD-10-CM | POA: Diagnosis not present

## 2016-04-17 DIAGNOSIS — M869 Osteomyelitis, unspecified: Secondary | ICD-10-CM | POA: Diagnosis not present

## 2016-04-17 DIAGNOSIS — E1151 Type 2 diabetes mellitus with diabetic peripheral angiopathy without gangrene: Secondary | ICD-10-CM | POA: Diagnosis not present

## 2016-04-17 DIAGNOSIS — Z5181 Encounter for therapeutic drug level monitoring: Secondary | ICD-10-CM | POA: Diagnosis not present

## 2016-04-17 DIAGNOSIS — L02612 Cutaneous abscess of left foot: Secondary | ICD-10-CM | POA: Diagnosis not present

## 2016-04-17 DIAGNOSIS — E1165 Type 2 diabetes mellitus with hyperglycemia: Secondary | ICD-10-CM | POA: Diagnosis not present

## 2016-04-17 DIAGNOSIS — E1169 Type 2 diabetes mellitus with other specified complication: Secondary | ICD-10-CM | POA: Diagnosis not present

## 2016-04-18 DIAGNOSIS — Z452 Encounter for adjustment and management of vascular access device: Secondary | ICD-10-CM | POA: Diagnosis not present

## 2016-04-18 DIAGNOSIS — I10 Essential (primary) hypertension: Secondary | ICD-10-CM | POA: Diagnosis not present

## 2016-04-18 DIAGNOSIS — E1151 Type 2 diabetes mellitus with diabetic peripheral angiopathy without gangrene: Secondary | ICD-10-CM | POA: Diagnosis not present

## 2016-04-18 DIAGNOSIS — E11621 Type 2 diabetes mellitus with foot ulcer: Secondary | ICD-10-CM | POA: Diagnosis not present

## 2016-04-18 DIAGNOSIS — E1140 Type 2 diabetes mellitus with diabetic neuropathy, unspecified: Secondary | ICD-10-CM | POA: Diagnosis not present

## 2016-04-18 DIAGNOSIS — L97524 Non-pressure chronic ulcer of other part of left foot with necrosis of bone: Secondary | ICD-10-CM | POA: Diagnosis not present

## 2016-04-18 DIAGNOSIS — Z792 Long term (current) use of antibiotics: Secondary | ICD-10-CM | POA: Diagnosis not present

## 2016-04-18 DIAGNOSIS — M869 Osteomyelitis, unspecified: Secondary | ICD-10-CM | POA: Diagnosis not present

## 2016-04-18 DIAGNOSIS — Z5181 Encounter for therapeutic drug level monitoring: Secondary | ICD-10-CM | POA: Diagnosis not present

## 2016-04-18 DIAGNOSIS — L03032 Cellulitis of left toe: Secondary | ICD-10-CM | POA: Diagnosis not present

## 2016-04-18 DIAGNOSIS — E1169 Type 2 diabetes mellitus with other specified complication: Secondary | ICD-10-CM | POA: Diagnosis not present

## 2016-04-18 DIAGNOSIS — L02612 Cutaneous abscess of left foot: Secondary | ICD-10-CM | POA: Diagnosis not present

## 2016-04-18 DIAGNOSIS — E1165 Type 2 diabetes mellitus with hyperglycemia: Secondary | ICD-10-CM | POA: Diagnosis not present

## 2016-04-19 DIAGNOSIS — L03032 Cellulitis of left toe: Secondary | ICD-10-CM | POA: Diagnosis not present

## 2016-04-19 DIAGNOSIS — E1140 Type 2 diabetes mellitus with diabetic neuropathy, unspecified: Secondary | ICD-10-CM | POA: Diagnosis not present

## 2016-04-19 DIAGNOSIS — E1165 Type 2 diabetes mellitus with hyperglycemia: Secondary | ICD-10-CM | POA: Diagnosis not present

## 2016-04-19 DIAGNOSIS — Z452 Encounter for adjustment and management of vascular access device: Secondary | ICD-10-CM | POA: Diagnosis not present

## 2016-04-19 DIAGNOSIS — Z792 Long term (current) use of antibiotics: Secondary | ICD-10-CM | POA: Diagnosis not present

## 2016-04-19 DIAGNOSIS — L97524 Non-pressure chronic ulcer of other part of left foot with necrosis of bone: Secondary | ICD-10-CM | POA: Diagnosis not present

## 2016-04-19 DIAGNOSIS — E11621 Type 2 diabetes mellitus with foot ulcer: Secondary | ICD-10-CM | POA: Diagnosis not present

## 2016-04-19 DIAGNOSIS — M869 Osteomyelitis, unspecified: Secondary | ICD-10-CM | POA: Diagnosis not present

## 2016-04-19 DIAGNOSIS — Z5181 Encounter for therapeutic drug level monitoring: Secondary | ICD-10-CM | POA: Diagnosis not present

## 2016-04-19 DIAGNOSIS — L02612 Cutaneous abscess of left foot: Secondary | ICD-10-CM | POA: Diagnosis not present

## 2016-04-19 DIAGNOSIS — E1151 Type 2 diabetes mellitus with diabetic peripheral angiopathy without gangrene: Secondary | ICD-10-CM | POA: Diagnosis not present

## 2016-04-19 DIAGNOSIS — I10 Essential (primary) hypertension: Secondary | ICD-10-CM | POA: Diagnosis not present

## 2016-04-19 DIAGNOSIS — E1169 Type 2 diabetes mellitus with other specified complication: Secondary | ICD-10-CM | POA: Diagnosis not present

## 2016-04-20 DIAGNOSIS — Z792 Long term (current) use of antibiotics: Secondary | ICD-10-CM | POA: Diagnosis not present

## 2016-04-20 DIAGNOSIS — Z452 Encounter for adjustment and management of vascular access device: Secondary | ICD-10-CM | POA: Diagnosis not present

## 2016-04-20 DIAGNOSIS — L02612 Cutaneous abscess of left foot: Secondary | ICD-10-CM | POA: Diagnosis not present

## 2016-04-20 DIAGNOSIS — E1140 Type 2 diabetes mellitus with diabetic neuropathy, unspecified: Secondary | ICD-10-CM | POA: Diagnosis not present

## 2016-04-20 DIAGNOSIS — I10 Essential (primary) hypertension: Secondary | ICD-10-CM | POA: Diagnosis not present

## 2016-04-20 DIAGNOSIS — L97524 Non-pressure chronic ulcer of other part of left foot with necrosis of bone: Secondary | ICD-10-CM | POA: Diagnosis not present

## 2016-04-20 DIAGNOSIS — M869 Osteomyelitis, unspecified: Secondary | ICD-10-CM | POA: Diagnosis not present

## 2016-04-20 DIAGNOSIS — E1151 Type 2 diabetes mellitus with diabetic peripheral angiopathy without gangrene: Secondary | ICD-10-CM | POA: Diagnosis not present

## 2016-04-20 DIAGNOSIS — L03032 Cellulitis of left toe: Secondary | ICD-10-CM | POA: Diagnosis not present

## 2016-04-20 DIAGNOSIS — E1169 Type 2 diabetes mellitus with other specified complication: Secondary | ICD-10-CM | POA: Diagnosis not present

## 2016-04-20 DIAGNOSIS — E1165 Type 2 diabetes mellitus with hyperglycemia: Secondary | ICD-10-CM | POA: Diagnosis not present

## 2016-04-20 DIAGNOSIS — E11621 Type 2 diabetes mellitus with foot ulcer: Secondary | ICD-10-CM | POA: Diagnosis not present

## 2016-04-20 DIAGNOSIS — Z5181 Encounter for therapeutic drug level monitoring: Secondary | ICD-10-CM | POA: Diagnosis not present

## 2016-04-21 DIAGNOSIS — L97523 Non-pressure chronic ulcer of other part of left foot with necrosis of muscle: Secondary | ICD-10-CM | POA: Diagnosis not present

## 2016-04-22 DIAGNOSIS — Z792 Long term (current) use of antibiotics: Secondary | ICD-10-CM | POA: Diagnosis not present

## 2016-04-22 DIAGNOSIS — Z452 Encounter for adjustment and management of vascular access device: Secondary | ICD-10-CM | POA: Diagnosis not present

## 2016-04-22 DIAGNOSIS — I10 Essential (primary) hypertension: Secondary | ICD-10-CM | POA: Diagnosis not present

## 2016-04-22 DIAGNOSIS — E11621 Type 2 diabetes mellitus with foot ulcer: Secondary | ICD-10-CM | POA: Diagnosis not present

## 2016-04-22 DIAGNOSIS — E1169 Type 2 diabetes mellitus with other specified complication: Secondary | ICD-10-CM | POA: Diagnosis not present

## 2016-04-22 DIAGNOSIS — Z5181 Encounter for therapeutic drug level monitoring: Secondary | ICD-10-CM | POA: Diagnosis not present

## 2016-04-22 DIAGNOSIS — E1140 Type 2 diabetes mellitus with diabetic neuropathy, unspecified: Secondary | ICD-10-CM | POA: Diagnosis not present

## 2016-04-22 DIAGNOSIS — E1151 Type 2 diabetes mellitus with diabetic peripheral angiopathy without gangrene: Secondary | ICD-10-CM | POA: Diagnosis not present

## 2016-04-22 DIAGNOSIS — E1165 Type 2 diabetes mellitus with hyperglycemia: Secondary | ICD-10-CM | POA: Diagnosis not present

## 2016-04-22 DIAGNOSIS — L97524 Non-pressure chronic ulcer of other part of left foot with necrosis of bone: Secondary | ICD-10-CM | POA: Diagnosis not present

## 2016-04-22 DIAGNOSIS — L02612 Cutaneous abscess of left foot: Secondary | ICD-10-CM | POA: Diagnosis not present

## 2016-04-22 DIAGNOSIS — L03032 Cellulitis of left toe: Secondary | ICD-10-CM | POA: Diagnosis not present

## 2016-04-22 DIAGNOSIS — M869 Osteomyelitis, unspecified: Secondary | ICD-10-CM | POA: Diagnosis not present

## 2016-04-23 DIAGNOSIS — E1142 Type 2 diabetes mellitus with diabetic polyneuropathy: Secondary | ICD-10-CM | POA: Diagnosis not present

## 2016-04-23 DIAGNOSIS — E11621 Type 2 diabetes mellitus with foot ulcer: Secondary | ICD-10-CM | POA: Diagnosis not present

## 2016-04-23 DIAGNOSIS — E1151 Type 2 diabetes mellitus with diabetic peripheral angiopathy without gangrene: Secondary | ICD-10-CM | POA: Diagnosis not present

## 2016-04-23 DIAGNOSIS — L03032 Cellulitis of left toe: Secondary | ICD-10-CM | POA: Diagnosis not present

## 2016-04-23 DIAGNOSIS — L02612 Cutaneous abscess of left foot: Secondary | ICD-10-CM | POA: Diagnosis not present

## 2016-04-23 DIAGNOSIS — M86472 Chronic osteomyelitis with draining sinus, left ankle and foot: Secondary | ICD-10-CM | POA: Diagnosis not present

## 2016-04-23 DIAGNOSIS — E1169 Type 2 diabetes mellitus with other specified complication: Secondary | ICD-10-CM | POA: Diagnosis not present

## 2016-04-23 DIAGNOSIS — M869 Osteomyelitis, unspecified: Secondary | ICD-10-CM | POA: Diagnosis not present

## 2016-04-23 DIAGNOSIS — I10 Essential (primary) hypertension: Secondary | ICD-10-CM | POA: Diagnosis not present

## 2016-04-23 DIAGNOSIS — L97524 Non-pressure chronic ulcer of other part of left foot with necrosis of bone: Secondary | ICD-10-CM | POA: Diagnosis not present

## 2016-04-23 DIAGNOSIS — E1165 Type 2 diabetes mellitus with hyperglycemia: Secondary | ICD-10-CM | POA: Diagnosis not present

## 2016-04-23 DIAGNOSIS — Z792 Long term (current) use of antibiotics: Secondary | ICD-10-CM | POA: Diagnosis not present

## 2016-04-23 DIAGNOSIS — E1140 Type 2 diabetes mellitus with diabetic neuropathy, unspecified: Secondary | ICD-10-CM | POA: Diagnosis not present

## 2016-04-23 DIAGNOSIS — Z5181 Encounter for therapeutic drug level monitoring: Secondary | ICD-10-CM | POA: Diagnosis not present

## 2016-04-23 DIAGNOSIS — Z452 Encounter for adjustment and management of vascular access device: Secondary | ICD-10-CM | POA: Diagnosis not present

## 2016-04-24 DIAGNOSIS — E1140 Type 2 diabetes mellitus with diabetic neuropathy, unspecified: Secondary | ICD-10-CM | POA: Diagnosis not present

## 2016-04-24 DIAGNOSIS — E11621 Type 2 diabetes mellitus with foot ulcer: Secondary | ICD-10-CM | POA: Diagnosis not present

## 2016-04-24 DIAGNOSIS — E1142 Type 2 diabetes mellitus with diabetic polyneuropathy: Secondary | ICD-10-CM | POA: Diagnosis not present

## 2016-04-24 DIAGNOSIS — Z452 Encounter for adjustment and management of vascular access device: Secondary | ICD-10-CM | POA: Diagnosis not present

## 2016-04-24 DIAGNOSIS — M86472 Chronic osteomyelitis with draining sinus, left ankle and foot: Secondary | ICD-10-CM | POA: Diagnosis not present

## 2016-04-24 DIAGNOSIS — E1165 Type 2 diabetes mellitus with hyperglycemia: Secondary | ICD-10-CM | POA: Diagnosis not present

## 2016-04-24 DIAGNOSIS — L02612 Cutaneous abscess of left foot: Secondary | ICD-10-CM | POA: Diagnosis not present

## 2016-04-24 DIAGNOSIS — M869 Osteomyelitis, unspecified: Secondary | ICD-10-CM | POA: Diagnosis not present

## 2016-04-24 DIAGNOSIS — E1151 Type 2 diabetes mellitus with diabetic peripheral angiopathy without gangrene: Secondary | ICD-10-CM | POA: Diagnosis not present

## 2016-04-24 DIAGNOSIS — I10 Essential (primary) hypertension: Secondary | ICD-10-CM | POA: Diagnosis not present

## 2016-04-24 DIAGNOSIS — Z5181 Encounter for therapeutic drug level monitoring: Secondary | ICD-10-CM | POA: Diagnosis not present

## 2016-04-24 DIAGNOSIS — L03032 Cellulitis of left toe: Secondary | ICD-10-CM | POA: Diagnosis not present

## 2016-04-24 DIAGNOSIS — E1169 Type 2 diabetes mellitus with other specified complication: Secondary | ICD-10-CM | POA: Diagnosis not present

## 2016-04-24 DIAGNOSIS — L97524 Non-pressure chronic ulcer of other part of left foot with necrosis of bone: Secondary | ICD-10-CM | POA: Diagnosis not present

## 2016-04-24 DIAGNOSIS — Z792 Long term (current) use of antibiotics: Secondary | ICD-10-CM | POA: Diagnosis not present

## 2016-04-25 DIAGNOSIS — E1140 Type 2 diabetes mellitus with diabetic neuropathy, unspecified: Secondary | ICD-10-CM | POA: Diagnosis not present

## 2016-04-25 DIAGNOSIS — L03032 Cellulitis of left toe: Secondary | ICD-10-CM | POA: Diagnosis not present

## 2016-04-25 DIAGNOSIS — L97524 Non-pressure chronic ulcer of other part of left foot with necrosis of bone: Secondary | ICD-10-CM | POA: Diagnosis not present

## 2016-04-25 DIAGNOSIS — M549 Dorsalgia, unspecified: Secondary | ICD-10-CM | POA: Diagnosis not present

## 2016-04-25 DIAGNOSIS — L02612 Cutaneous abscess of left foot: Secondary | ICD-10-CM | POA: Diagnosis not present

## 2016-04-25 DIAGNOSIS — M869 Osteomyelitis, unspecified: Secondary | ICD-10-CM | POA: Diagnosis not present

## 2016-04-25 DIAGNOSIS — E11621 Type 2 diabetes mellitus with foot ulcer: Secondary | ICD-10-CM | POA: Diagnosis not present

## 2016-04-25 DIAGNOSIS — I10 Essential (primary) hypertension: Secondary | ICD-10-CM | POA: Diagnosis not present

## 2016-04-25 DIAGNOSIS — Z5181 Encounter for therapeutic drug level monitoring: Secondary | ICD-10-CM | POA: Diagnosis not present

## 2016-04-25 DIAGNOSIS — Z792 Long term (current) use of antibiotics: Secondary | ICD-10-CM | POA: Diagnosis not present

## 2016-04-25 DIAGNOSIS — G4700 Insomnia, unspecified: Secondary | ICD-10-CM | POA: Diagnosis not present

## 2016-04-25 DIAGNOSIS — Z452 Encounter for adjustment and management of vascular access device: Secondary | ICD-10-CM | POA: Diagnosis not present

## 2016-04-25 DIAGNOSIS — M86472 Chronic osteomyelitis with draining sinus, left ankle and foot: Secondary | ICD-10-CM | POA: Diagnosis not present

## 2016-04-25 DIAGNOSIS — E785 Hyperlipidemia, unspecified: Secondary | ICD-10-CM | POA: Diagnosis not present

## 2016-04-25 DIAGNOSIS — Z79899 Other long term (current) drug therapy: Secondary | ICD-10-CM | POA: Diagnosis not present

## 2016-04-25 DIAGNOSIS — G629 Polyneuropathy, unspecified: Secondary | ICD-10-CM | POA: Diagnosis not present

## 2016-04-25 DIAGNOSIS — E1151 Type 2 diabetes mellitus with diabetic peripheral angiopathy without gangrene: Secondary | ICD-10-CM | POA: Diagnosis not present

## 2016-04-25 DIAGNOSIS — E1169 Type 2 diabetes mellitus with other specified complication: Secondary | ICD-10-CM | POA: Diagnosis not present

## 2016-04-25 DIAGNOSIS — E1142 Type 2 diabetes mellitus with diabetic polyneuropathy: Secondary | ICD-10-CM | POA: Diagnosis not present

## 2016-04-25 DIAGNOSIS — E1165 Type 2 diabetes mellitus with hyperglycemia: Secondary | ICD-10-CM | POA: Diagnosis not present

## 2016-04-25 DIAGNOSIS — E291 Testicular hypofunction: Secondary | ICD-10-CM | POA: Diagnosis not present

## 2016-04-26 DIAGNOSIS — E1165 Type 2 diabetes mellitus with hyperglycemia: Secondary | ICD-10-CM | POA: Diagnosis not present

## 2016-04-26 DIAGNOSIS — E1142 Type 2 diabetes mellitus with diabetic polyneuropathy: Secondary | ICD-10-CM | POA: Diagnosis not present

## 2016-04-26 DIAGNOSIS — E1151 Type 2 diabetes mellitus with diabetic peripheral angiopathy without gangrene: Secondary | ICD-10-CM | POA: Diagnosis not present

## 2016-04-26 DIAGNOSIS — L97524 Non-pressure chronic ulcer of other part of left foot with necrosis of bone: Secondary | ICD-10-CM | POA: Diagnosis not present

## 2016-04-26 DIAGNOSIS — M869 Osteomyelitis, unspecified: Secondary | ICD-10-CM | POA: Diagnosis not present

## 2016-04-26 DIAGNOSIS — Z792 Long term (current) use of antibiotics: Secondary | ICD-10-CM | POA: Diagnosis not present

## 2016-04-26 DIAGNOSIS — Z5181 Encounter for therapeutic drug level monitoring: Secondary | ICD-10-CM | POA: Diagnosis not present

## 2016-04-26 DIAGNOSIS — M86472 Chronic osteomyelitis with draining sinus, left ankle and foot: Secondary | ICD-10-CM | POA: Diagnosis not present

## 2016-04-26 DIAGNOSIS — E1169 Type 2 diabetes mellitus with other specified complication: Secondary | ICD-10-CM | POA: Diagnosis not present

## 2016-04-26 DIAGNOSIS — L02612 Cutaneous abscess of left foot: Secondary | ICD-10-CM | POA: Diagnosis not present

## 2016-04-26 DIAGNOSIS — L03032 Cellulitis of left toe: Secondary | ICD-10-CM | POA: Diagnosis not present

## 2016-04-26 DIAGNOSIS — I10 Essential (primary) hypertension: Secondary | ICD-10-CM | POA: Diagnosis not present

## 2016-04-26 DIAGNOSIS — E1140 Type 2 diabetes mellitus with diabetic neuropathy, unspecified: Secondary | ICD-10-CM | POA: Diagnosis not present

## 2016-04-26 DIAGNOSIS — Z452 Encounter for adjustment and management of vascular access device: Secondary | ICD-10-CM | POA: Diagnosis not present

## 2016-04-26 DIAGNOSIS — E11621 Type 2 diabetes mellitus with foot ulcer: Secondary | ICD-10-CM | POA: Diagnosis not present

## 2016-04-27 DIAGNOSIS — L97524 Non-pressure chronic ulcer of other part of left foot with necrosis of bone: Secondary | ICD-10-CM | POA: Diagnosis not present

## 2016-04-27 DIAGNOSIS — L03032 Cellulitis of left toe: Secondary | ICD-10-CM | POA: Diagnosis not present

## 2016-04-27 DIAGNOSIS — R259 Unspecified abnormal involuntary movements: Secondary | ICD-10-CM | POA: Diagnosis not present

## 2016-04-27 DIAGNOSIS — Z452 Encounter for adjustment and management of vascular access device: Secondary | ICD-10-CM | POA: Diagnosis not present

## 2016-04-27 DIAGNOSIS — L02612 Cutaneous abscess of left foot: Secondary | ICD-10-CM | POA: Diagnosis not present

## 2016-04-27 DIAGNOSIS — M86472 Chronic osteomyelitis with draining sinus, left ankle and foot: Secondary | ICD-10-CM | POA: Diagnosis not present

## 2016-04-27 DIAGNOSIS — E1165 Type 2 diabetes mellitus with hyperglycemia: Secondary | ICD-10-CM | POA: Diagnosis not present

## 2016-04-27 DIAGNOSIS — E1169 Type 2 diabetes mellitus with other specified complication: Secondary | ICD-10-CM | POA: Diagnosis not present

## 2016-04-27 DIAGNOSIS — I10 Essential (primary) hypertension: Secondary | ICD-10-CM | POA: Diagnosis not present

## 2016-04-27 DIAGNOSIS — E1142 Type 2 diabetes mellitus with diabetic polyneuropathy: Secondary | ICD-10-CM | POA: Diagnosis not present

## 2016-04-27 DIAGNOSIS — R079 Chest pain, unspecified: Secondary | ICD-10-CM | POA: Diagnosis not present

## 2016-04-27 DIAGNOSIS — M25519 Pain in unspecified shoulder: Secondary | ICD-10-CM | POA: Diagnosis not present

## 2016-04-27 DIAGNOSIS — M25511 Pain in right shoulder: Secondary | ICD-10-CM | POA: Diagnosis not present

## 2016-04-27 DIAGNOSIS — E1140 Type 2 diabetes mellitus with diabetic neuropathy, unspecified: Secondary | ICD-10-CM | POA: Diagnosis not present

## 2016-04-27 DIAGNOSIS — R42 Dizziness and giddiness: Secondary | ICD-10-CM | POA: Diagnosis not present

## 2016-04-27 DIAGNOSIS — M79672 Pain in left foot: Secondary | ICD-10-CM | POA: Diagnosis not present

## 2016-04-27 DIAGNOSIS — E11621 Type 2 diabetes mellitus with foot ulcer: Secondary | ICD-10-CM | POA: Diagnosis not present

## 2016-04-27 DIAGNOSIS — R531 Weakness: Secondary | ICD-10-CM | POA: Diagnosis not present

## 2016-04-27 DIAGNOSIS — Z792 Long term (current) use of antibiotics: Secondary | ICD-10-CM | POA: Diagnosis not present

## 2016-04-27 DIAGNOSIS — Z5181 Encounter for therapeutic drug level monitoring: Secondary | ICD-10-CM | POA: Diagnosis not present

## 2016-04-27 DIAGNOSIS — M869 Osteomyelitis, unspecified: Secondary | ICD-10-CM | POA: Diagnosis not present

## 2016-04-27 DIAGNOSIS — E1151 Type 2 diabetes mellitus with diabetic peripheral angiopathy without gangrene: Secondary | ICD-10-CM | POA: Diagnosis not present

## 2016-04-28 DIAGNOSIS — E1142 Type 2 diabetes mellitus with diabetic polyneuropathy: Secondary | ICD-10-CM | POA: Diagnosis not present

## 2016-04-28 DIAGNOSIS — L02612 Cutaneous abscess of left foot: Secondary | ICD-10-CM | POA: Diagnosis not present

## 2016-04-28 DIAGNOSIS — L97523 Non-pressure chronic ulcer of other part of left foot with necrosis of muscle: Secondary | ICD-10-CM | POA: Diagnosis not present

## 2016-04-28 DIAGNOSIS — Z794 Long term (current) use of insulin: Secondary | ICD-10-CM | POA: Diagnosis not present

## 2016-04-29 DIAGNOSIS — E1140 Type 2 diabetes mellitus with diabetic neuropathy, unspecified: Secondary | ICD-10-CM | POA: Diagnosis not present

## 2016-04-29 DIAGNOSIS — E1165 Type 2 diabetes mellitus with hyperglycemia: Secondary | ICD-10-CM | POA: Diagnosis not present

## 2016-04-29 DIAGNOSIS — Z452 Encounter for adjustment and management of vascular access device: Secondary | ICD-10-CM | POA: Diagnosis not present

## 2016-04-29 DIAGNOSIS — E11621 Type 2 diabetes mellitus with foot ulcer: Secondary | ICD-10-CM | POA: Diagnosis not present

## 2016-04-29 DIAGNOSIS — E1151 Type 2 diabetes mellitus with diabetic peripheral angiopathy without gangrene: Secondary | ICD-10-CM | POA: Diagnosis not present

## 2016-04-29 DIAGNOSIS — M869 Osteomyelitis, unspecified: Secondary | ICD-10-CM | POA: Diagnosis not present

## 2016-04-29 DIAGNOSIS — M86472 Chronic osteomyelitis with draining sinus, left ankle and foot: Secondary | ICD-10-CM | POA: Diagnosis not present

## 2016-04-29 DIAGNOSIS — I10 Essential (primary) hypertension: Secondary | ICD-10-CM | POA: Diagnosis not present

## 2016-04-29 DIAGNOSIS — L02612 Cutaneous abscess of left foot: Secondary | ICD-10-CM | POA: Diagnosis not present

## 2016-04-29 DIAGNOSIS — E1142 Type 2 diabetes mellitus with diabetic polyneuropathy: Secondary | ICD-10-CM | POA: Diagnosis not present

## 2016-04-29 DIAGNOSIS — L03032 Cellulitis of left toe: Secondary | ICD-10-CM | POA: Diagnosis not present

## 2016-04-29 DIAGNOSIS — E1169 Type 2 diabetes mellitus with other specified complication: Secondary | ICD-10-CM | POA: Diagnosis not present

## 2016-04-29 DIAGNOSIS — Z5181 Encounter for therapeutic drug level monitoring: Secondary | ICD-10-CM | POA: Diagnosis not present

## 2016-04-29 DIAGNOSIS — L97524 Non-pressure chronic ulcer of other part of left foot with necrosis of bone: Secondary | ICD-10-CM | POA: Diagnosis not present

## 2016-04-29 DIAGNOSIS — Z792 Long term (current) use of antibiotics: Secondary | ICD-10-CM | POA: Diagnosis not present

## 2016-04-30 DIAGNOSIS — E1151 Type 2 diabetes mellitus with diabetic peripheral angiopathy without gangrene: Secondary | ICD-10-CM | POA: Diagnosis not present

## 2016-04-30 DIAGNOSIS — I10 Essential (primary) hypertension: Secondary | ICD-10-CM | POA: Diagnosis not present

## 2016-04-30 DIAGNOSIS — E1169 Type 2 diabetes mellitus with other specified complication: Secondary | ICD-10-CM | POA: Diagnosis not present

## 2016-04-30 DIAGNOSIS — E1140 Type 2 diabetes mellitus with diabetic neuropathy, unspecified: Secondary | ICD-10-CM | POA: Diagnosis not present

## 2016-04-30 DIAGNOSIS — Z792 Long term (current) use of antibiotics: Secondary | ICD-10-CM | POA: Diagnosis not present

## 2016-04-30 DIAGNOSIS — E1165 Type 2 diabetes mellitus with hyperglycemia: Secondary | ICD-10-CM | POA: Diagnosis not present

## 2016-04-30 DIAGNOSIS — Z452 Encounter for adjustment and management of vascular access device: Secondary | ICD-10-CM | POA: Diagnosis not present

## 2016-04-30 DIAGNOSIS — M869 Osteomyelitis, unspecified: Secondary | ICD-10-CM | POA: Diagnosis not present

## 2016-04-30 DIAGNOSIS — M86472 Chronic osteomyelitis with draining sinus, left ankle and foot: Secondary | ICD-10-CM | POA: Diagnosis not present

## 2016-04-30 DIAGNOSIS — Z5181 Encounter for therapeutic drug level monitoring: Secondary | ICD-10-CM | POA: Diagnosis not present

## 2016-04-30 DIAGNOSIS — L03032 Cellulitis of left toe: Secondary | ICD-10-CM | POA: Diagnosis not present

## 2016-04-30 DIAGNOSIS — L02612 Cutaneous abscess of left foot: Secondary | ICD-10-CM | POA: Diagnosis not present

## 2016-04-30 DIAGNOSIS — L97524 Non-pressure chronic ulcer of other part of left foot with necrosis of bone: Secondary | ICD-10-CM | POA: Diagnosis not present

## 2016-04-30 DIAGNOSIS — E11621 Type 2 diabetes mellitus with foot ulcer: Secondary | ICD-10-CM | POA: Diagnosis not present

## 2016-04-30 DIAGNOSIS — E1142 Type 2 diabetes mellitus with diabetic polyneuropathy: Secondary | ICD-10-CM | POA: Diagnosis not present

## 2016-05-01 DIAGNOSIS — E11621 Type 2 diabetes mellitus with foot ulcer: Secondary | ICD-10-CM | POA: Diagnosis not present

## 2016-05-01 DIAGNOSIS — I10 Essential (primary) hypertension: Secondary | ICD-10-CM | POA: Diagnosis not present

## 2016-05-01 DIAGNOSIS — Z452 Encounter for adjustment and management of vascular access device: Secondary | ICD-10-CM | POA: Diagnosis not present

## 2016-05-01 DIAGNOSIS — L97524 Non-pressure chronic ulcer of other part of left foot with necrosis of bone: Secondary | ICD-10-CM | POA: Diagnosis not present

## 2016-05-01 DIAGNOSIS — Z5181 Encounter for therapeutic drug level monitoring: Secondary | ICD-10-CM | POA: Diagnosis not present

## 2016-05-01 DIAGNOSIS — M869 Osteomyelitis, unspecified: Secondary | ICD-10-CM | POA: Diagnosis not present

## 2016-05-01 DIAGNOSIS — E1165 Type 2 diabetes mellitus with hyperglycemia: Secondary | ICD-10-CM | POA: Diagnosis not present

## 2016-05-01 DIAGNOSIS — L02612 Cutaneous abscess of left foot: Secondary | ICD-10-CM | POA: Diagnosis not present

## 2016-05-01 DIAGNOSIS — E1169 Type 2 diabetes mellitus with other specified complication: Secondary | ICD-10-CM | POA: Diagnosis not present

## 2016-05-01 DIAGNOSIS — E1151 Type 2 diabetes mellitus with diabetic peripheral angiopathy without gangrene: Secondary | ICD-10-CM | POA: Diagnosis not present

## 2016-05-01 DIAGNOSIS — E1142 Type 2 diabetes mellitus with diabetic polyneuropathy: Secondary | ICD-10-CM | POA: Diagnosis not present

## 2016-05-01 DIAGNOSIS — L03032 Cellulitis of left toe: Secondary | ICD-10-CM | POA: Diagnosis not present

## 2016-05-01 DIAGNOSIS — E1140 Type 2 diabetes mellitus with diabetic neuropathy, unspecified: Secondary | ICD-10-CM | POA: Diagnosis not present

## 2016-05-01 DIAGNOSIS — Z792 Long term (current) use of antibiotics: Secondary | ICD-10-CM | POA: Diagnosis not present

## 2016-05-01 DIAGNOSIS — M86472 Chronic osteomyelitis with draining sinus, left ankle and foot: Secondary | ICD-10-CM | POA: Diagnosis not present

## 2016-05-02 DIAGNOSIS — Z452 Encounter for adjustment and management of vascular access device: Secondary | ICD-10-CM | POA: Diagnosis not present

## 2016-05-02 DIAGNOSIS — M86472 Chronic osteomyelitis with draining sinus, left ankle and foot: Secondary | ICD-10-CM | POA: Diagnosis not present

## 2016-05-02 DIAGNOSIS — E1140 Type 2 diabetes mellitus with diabetic neuropathy, unspecified: Secondary | ICD-10-CM | POA: Diagnosis not present

## 2016-05-02 DIAGNOSIS — Z5181 Encounter for therapeutic drug level monitoring: Secondary | ICD-10-CM | POA: Diagnosis not present

## 2016-05-02 DIAGNOSIS — L97524 Non-pressure chronic ulcer of other part of left foot with necrosis of bone: Secondary | ICD-10-CM | POA: Diagnosis not present

## 2016-05-02 DIAGNOSIS — E1151 Type 2 diabetes mellitus with diabetic peripheral angiopathy without gangrene: Secondary | ICD-10-CM | POA: Diagnosis not present

## 2016-05-02 DIAGNOSIS — Z792 Long term (current) use of antibiotics: Secondary | ICD-10-CM | POA: Diagnosis not present

## 2016-05-02 DIAGNOSIS — E1169 Type 2 diabetes mellitus with other specified complication: Secondary | ICD-10-CM | POA: Diagnosis not present

## 2016-05-02 DIAGNOSIS — E11621 Type 2 diabetes mellitus with foot ulcer: Secondary | ICD-10-CM | POA: Diagnosis not present

## 2016-05-02 DIAGNOSIS — E1142 Type 2 diabetes mellitus with diabetic polyneuropathy: Secondary | ICD-10-CM | POA: Diagnosis not present

## 2016-05-02 DIAGNOSIS — I10 Essential (primary) hypertension: Secondary | ICD-10-CM | POA: Diagnosis not present

## 2016-05-02 DIAGNOSIS — E1165 Type 2 diabetes mellitus with hyperglycemia: Secondary | ICD-10-CM | POA: Diagnosis not present

## 2016-05-02 DIAGNOSIS — M869 Osteomyelitis, unspecified: Secondary | ICD-10-CM | POA: Diagnosis not present

## 2016-05-02 DIAGNOSIS — L03032 Cellulitis of left toe: Secondary | ICD-10-CM | POA: Diagnosis not present

## 2016-05-02 DIAGNOSIS — L02612 Cutaneous abscess of left foot: Secondary | ICD-10-CM | POA: Diagnosis not present

## 2016-05-03 DIAGNOSIS — L03032 Cellulitis of left toe: Secondary | ICD-10-CM | POA: Diagnosis not present

## 2016-05-03 DIAGNOSIS — Z452 Encounter for adjustment and management of vascular access device: Secondary | ICD-10-CM | POA: Diagnosis not present

## 2016-05-03 DIAGNOSIS — Z5181 Encounter for therapeutic drug level monitoring: Secondary | ICD-10-CM | POA: Diagnosis not present

## 2016-05-03 DIAGNOSIS — M869 Osteomyelitis, unspecified: Secondary | ICD-10-CM | POA: Diagnosis not present

## 2016-05-03 DIAGNOSIS — E11621 Type 2 diabetes mellitus with foot ulcer: Secondary | ICD-10-CM | POA: Diagnosis not present

## 2016-05-03 DIAGNOSIS — M86472 Chronic osteomyelitis with draining sinus, left ankle and foot: Secondary | ICD-10-CM | POA: Diagnosis not present

## 2016-05-03 DIAGNOSIS — E1140 Type 2 diabetes mellitus with diabetic neuropathy, unspecified: Secondary | ICD-10-CM | POA: Diagnosis not present

## 2016-05-03 DIAGNOSIS — L02612 Cutaneous abscess of left foot: Secondary | ICD-10-CM | POA: Diagnosis not present

## 2016-05-03 DIAGNOSIS — E1169 Type 2 diabetes mellitus with other specified complication: Secondary | ICD-10-CM | POA: Diagnosis not present

## 2016-05-03 DIAGNOSIS — I10 Essential (primary) hypertension: Secondary | ICD-10-CM | POA: Diagnosis not present

## 2016-05-03 DIAGNOSIS — E1151 Type 2 diabetes mellitus with diabetic peripheral angiopathy without gangrene: Secondary | ICD-10-CM | POA: Diagnosis not present

## 2016-05-03 DIAGNOSIS — Z792 Long term (current) use of antibiotics: Secondary | ICD-10-CM | POA: Diagnosis not present

## 2016-05-03 DIAGNOSIS — E1165 Type 2 diabetes mellitus with hyperglycemia: Secondary | ICD-10-CM | POA: Diagnosis not present

## 2016-05-03 DIAGNOSIS — L97524 Non-pressure chronic ulcer of other part of left foot with necrosis of bone: Secondary | ICD-10-CM | POA: Diagnosis not present

## 2016-05-03 DIAGNOSIS — E1142 Type 2 diabetes mellitus with diabetic polyneuropathy: Secondary | ICD-10-CM | POA: Diagnosis not present

## 2016-05-04 DIAGNOSIS — L97524 Non-pressure chronic ulcer of other part of left foot with necrosis of bone: Secondary | ICD-10-CM | POA: Diagnosis not present

## 2016-05-04 DIAGNOSIS — E1142 Type 2 diabetes mellitus with diabetic polyneuropathy: Secondary | ICD-10-CM | POA: Diagnosis not present

## 2016-05-04 DIAGNOSIS — E1140 Type 2 diabetes mellitus with diabetic neuropathy, unspecified: Secondary | ICD-10-CM | POA: Diagnosis not present

## 2016-05-04 DIAGNOSIS — L03032 Cellulitis of left toe: Secondary | ICD-10-CM | POA: Diagnosis not present

## 2016-05-04 DIAGNOSIS — L02612 Cutaneous abscess of left foot: Secondary | ICD-10-CM | POA: Diagnosis not present

## 2016-05-04 DIAGNOSIS — Z5181 Encounter for therapeutic drug level monitoring: Secondary | ICD-10-CM | POA: Diagnosis not present

## 2016-05-04 DIAGNOSIS — I10 Essential (primary) hypertension: Secondary | ICD-10-CM | POA: Diagnosis not present

## 2016-05-04 DIAGNOSIS — Z792 Long term (current) use of antibiotics: Secondary | ICD-10-CM | POA: Diagnosis not present

## 2016-05-04 DIAGNOSIS — E1169 Type 2 diabetes mellitus with other specified complication: Secondary | ICD-10-CM | POA: Diagnosis not present

## 2016-05-04 DIAGNOSIS — M86472 Chronic osteomyelitis with draining sinus, left ankle and foot: Secondary | ICD-10-CM | POA: Diagnosis not present

## 2016-05-04 DIAGNOSIS — M869 Osteomyelitis, unspecified: Secondary | ICD-10-CM | POA: Diagnosis not present

## 2016-05-04 DIAGNOSIS — Z452 Encounter for adjustment and management of vascular access device: Secondary | ICD-10-CM | POA: Diagnosis not present

## 2016-05-04 DIAGNOSIS — E1165 Type 2 diabetes mellitus with hyperglycemia: Secondary | ICD-10-CM | POA: Diagnosis not present

## 2016-05-04 DIAGNOSIS — E1151 Type 2 diabetes mellitus with diabetic peripheral angiopathy without gangrene: Secondary | ICD-10-CM | POA: Diagnosis not present

## 2016-05-04 DIAGNOSIS — E11621 Type 2 diabetes mellitus with foot ulcer: Secondary | ICD-10-CM | POA: Diagnosis not present

## 2016-05-05 DIAGNOSIS — Z87891 Personal history of nicotine dependence: Secondary | ICD-10-CM | POA: Diagnosis not present

## 2016-05-05 DIAGNOSIS — Z5181 Encounter for therapeutic drug level monitoring: Secondary | ICD-10-CM | POA: Diagnosis not present

## 2016-05-05 DIAGNOSIS — Z452 Encounter for adjustment and management of vascular access device: Secondary | ICD-10-CM | POA: Diagnosis not present

## 2016-05-05 DIAGNOSIS — E11628 Type 2 diabetes mellitus with other skin complications: Secondary | ICD-10-CM | POA: Diagnosis not present

## 2016-05-05 DIAGNOSIS — E1169 Type 2 diabetes mellitus with other specified complication: Secondary | ICD-10-CM | POA: Diagnosis not present

## 2016-05-05 DIAGNOSIS — E1151 Type 2 diabetes mellitus with diabetic peripheral angiopathy without gangrene: Secondary | ICD-10-CM | POA: Diagnosis not present

## 2016-05-05 DIAGNOSIS — M869 Osteomyelitis, unspecified: Secondary | ICD-10-CM | POA: Diagnosis not present

## 2016-05-05 DIAGNOSIS — E11621 Type 2 diabetes mellitus with foot ulcer: Secondary | ICD-10-CM | POA: Diagnosis not present

## 2016-05-05 DIAGNOSIS — L02612 Cutaneous abscess of left foot: Secondary | ICD-10-CM | POA: Diagnosis not present

## 2016-05-05 DIAGNOSIS — L97524 Non-pressure chronic ulcer of other part of left foot with necrosis of bone: Secondary | ICD-10-CM | POA: Diagnosis not present

## 2016-05-05 DIAGNOSIS — Z79899 Other long term (current) drug therapy: Secondary | ICD-10-CM | POA: Diagnosis not present

## 2016-05-05 DIAGNOSIS — E782 Mixed hyperlipidemia: Secondary | ICD-10-CM | POA: Diagnosis not present

## 2016-05-05 DIAGNOSIS — E1140 Type 2 diabetes mellitus with diabetic neuropathy, unspecified: Secondary | ICD-10-CM | POA: Diagnosis not present

## 2016-05-05 DIAGNOSIS — L03032 Cellulitis of left toe: Secondary | ICD-10-CM | POA: Diagnosis not present

## 2016-05-05 DIAGNOSIS — M1990 Unspecified osteoarthritis, unspecified site: Secondary | ICD-10-CM | POA: Diagnosis not present

## 2016-05-05 DIAGNOSIS — E785 Hyperlipidemia, unspecified: Secondary | ICD-10-CM | POA: Diagnosis not present

## 2016-05-05 DIAGNOSIS — Z7982 Long term (current) use of aspirin: Secondary | ICD-10-CM | POA: Diagnosis not present

## 2016-05-05 DIAGNOSIS — E1165 Type 2 diabetes mellitus with hyperglycemia: Secondary | ICD-10-CM | POA: Diagnosis not present

## 2016-05-05 DIAGNOSIS — Z89422 Acquired absence of other left toe(s): Secondary | ICD-10-CM | POA: Diagnosis not present

## 2016-05-05 DIAGNOSIS — Z89412 Acquired absence of left great toe: Secondary | ICD-10-CM | POA: Diagnosis not present

## 2016-05-05 DIAGNOSIS — E1142 Type 2 diabetes mellitus with diabetic polyneuropathy: Secondary | ICD-10-CM | POA: Diagnosis not present

## 2016-05-05 DIAGNOSIS — Z792 Long term (current) use of antibiotics: Secondary | ICD-10-CM | POA: Diagnosis not present

## 2016-05-05 DIAGNOSIS — Z9842 Cataract extraction status, left eye: Secondary | ICD-10-CM | POA: Diagnosis not present

## 2016-05-05 DIAGNOSIS — Z961 Presence of intraocular lens: Secondary | ICD-10-CM | POA: Diagnosis not present

## 2016-05-05 DIAGNOSIS — M86072 Acute hematogenous osteomyelitis, left ankle and foot: Secondary | ICD-10-CM | POA: Diagnosis not present

## 2016-05-05 DIAGNOSIS — Z794 Long term (current) use of insulin: Secondary | ICD-10-CM | POA: Diagnosis not present

## 2016-05-05 DIAGNOSIS — M7989 Other specified soft tissue disorders: Secondary | ICD-10-CM | POA: Diagnosis not present

## 2016-05-05 DIAGNOSIS — L03116 Cellulitis of left lower limb: Secondary | ICD-10-CM | POA: Diagnosis not present

## 2016-05-05 DIAGNOSIS — L03039 Cellulitis of unspecified toe: Secondary | ICD-10-CM | POA: Diagnosis not present

## 2016-05-05 DIAGNOSIS — L97529 Non-pressure chronic ulcer of other part of left foot with unspecified severity: Secondary | ICD-10-CM | POA: Diagnosis not present

## 2016-05-05 DIAGNOSIS — Z9119 Patient's noncompliance with other medical treatment and regimen: Secondary | ICD-10-CM | POA: Diagnosis not present

## 2016-05-05 DIAGNOSIS — B9562 Methicillin resistant Staphylococcus aureus infection as the cause of diseases classified elsewhere: Secondary | ICD-10-CM | POA: Diagnosis not present

## 2016-05-05 DIAGNOSIS — L02619 Cutaneous abscess of unspecified foot: Secondary | ICD-10-CM | POA: Insufficient documentation

## 2016-05-05 DIAGNOSIS — M86472 Chronic osteomyelitis with draining sinus, left ankle and foot: Secondary | ICD-10-CM | POA: Diagnosis not present

## 2016-05-05 DIAGNOSIS — Z9841 Cataract extraction status, right eye: Secondary | ICD-10-CM | POA: Diagnosis not present

## 2016-05-05 DIAGNOSIS — I10 Essential (primary) hypertension: Secondary | ICD-10-CM | POA: Diagnosis not present

## 2016-05-05 DIAGNOSIS — M86172 Other acute osteomyelitis, left ankle and foot: Secondary | ICD-10-CM | POA: Diagnosis not present

## 2016-05-05 HISTORY — DX: Cellulitis of unspecified toe: L03.039

## 2016-05-05 HISTORY — DX: Cutaneous abscess of unspecified foot: L02.619

## 2016-05-08 DIAGNOSIS — M86472 Chronic osteomyelitis with draining sinus, left ankle and foot: Secondary | ICD-10-CM

## 2016-05-08 HISTORY — DX: Chronic osteomyelitis with draining sinus, left ankle and foot: M86.472

## 2016-05-09 DIAGNOSIS — L03032 Cellulitis of left toe: Secondary | ICD-10-CM | POA: Diagnosis not present

## 2016-05-09 DIAGNOSIS — Z5181 Encounter for therapeutic drug level monitoring: Secondary | ICD-10-CM | POA: Diagnosis not present

## 2016-05-09 DIAGNOSIS — M86472 Chronic osteomyelitis with draining sinus, left ankle and foot: Secondary | ICD-10-CM | POA: Diagnosis not present

## 2016-05-09 DIAGNOSIS — M869 Osteomyelitis, unspecified: Secondary | ICD-10-CM | POA: Diagnosis not present

## 2016-05-09 DIAGNOSIS — E1142 Type 2 diabetes mellitus with diabetic polyneuropathy: Secondary | ICD-10-CM | POA: Diagnosis not present

## 2016-05-09 DIAGNOSIS — I10 Essential (primary) hypertension: Secondary | ICD-10-CM | POA: Diagnosis not present

## 2016-05-09 DIAGNOSIS — E1140 Type 2 diabetes mellitus with diabetic neuropathy, unspecified: Secondary | ICD-10-CM | POA: Diagnosis not present

## 2016-05-09 DIAGNOSIS — Z792 Long term (current) use of antibiotics: Secondary | ICD-10-CM | POA: Diagnosis not present

## 2016-05-09 DIAGNOSIS — L02612 Cutaneous abscess of left foot: Secondary | ICD-10-CM | POA: Diagnosis not present

## 2016-05-09 DIAGNOSIS — E1169 Type 2 diabetes mellitus with other specified complication: Secondary | ICD-10-CM | POA: Diagnosis not present

## 2016-05-09 DIAGNOSIS — L97524 Non-pressure chronic ulcer of other part of left foot with necrosis of bone: Secondary | ICD-10-CM | POA: Diagnosis not present

## 2016-05-09 DIAGNOSIS — E11621 Type 2 diabetes mellitus with foot ulcer: Secondary | ICD-10-CM | POA: Diagnosis not present

## 2016-05-09 DIAGNOSIS — Z452 Encounter for adjustment and management of vascular access device: Secondary | ICD-10-CM | POA: Diagnosis not present

## 2016-05-09 DIAGNOSIS — E1151 Type 2 diabetes mellitus with diabetic peripheral angiopathy without gangrene: Secondary | ICD-10-CM | POA: Diagnosis not present

## 2016-05-09 DIAGNOSIS — E1165 Type 2 diabetes mellitus with hyperglycemia: Secondary | ICD-10-CM | POA: Diagnosis not present

## 2016-05-12 DIAGNOSIS — L03032 Cellulitis of left toe: Secondary | ICD-10-CM | POA: Diagnosis not present

## 2016-05-12 DIAGNOSIS — M869 Osteomyelitis, unspecified: Secondary | ICD-10-CM | POA: Diagnosis not present

## 2016-05-12 DIAGNOSIS — E1169 Type 2 diabetes mellitus with other specified complication: Secondary | ICD-10-CM | POA: Diagnosis not present

## 2016-05-12 DIAGNOSIS — E1140 Type 2 diabetes mellitus with diabetic neuropathy, unspecified: Secondary | ICD-10-CM | POA: Diagnosis not present

## 2016-05-12 DIAGNOSIS — L97524 Non-pressure chronic ulcer of other part of left foot with necrosis of bone: Secondary | ICD-10-CM | POA: Diagnosis not present

## 2016-05-12 DIAGNOSIS — E11621 Type 2 diabetes mellitus with foot ulcer: Secondary | ICD-10-CM | POA: Diagnosis not present

## 2016-05-12 DIAGNOSIS — M86472 Chronic osteomyelitis with draining sinus, left ankle and foot: Secondary | ICD-10-CM | POA: Diagnosis not present

## 2016-05-12 DIAGNOSIS — Z452 Encounter for adjustment and management of vascular access device: Secondary | ICD-10-CM | POA: Diagnosis not present

## 2016-05-12 DIAGNOSIS — Z5181 Encounter for therapeutic drug level monitoring: Secondary | ICD-10-CM | POA: Diagnosis not present

## 2016-05-12 DIAGNOSIS — E1151 Type 2 diabetes mellitus with diabetic peripheral angiopathy without gangrene: Secondary | ICD-10-CM | POA: Diagnosis not present

## 2016-05-12 DIAGNOSIS — E1165 Type 2 diabetes mellitus with hyperglycemia: Secondary | ICD-10-CM | POA: Diagnosis not present

## 2016-05-12 DIAGNOSIS — I10 Essential (primary) hypertension: Secondary | ICD-10-CM | POA: Diagnosis not present

## 2016-05-12 DIAGNOSIS — Z792 Long term (current) use of antibiotics: Secondary | ICD-10-CM | POA: Diagnosis not present

## 2016-05-12 DIAGNOSIS — E1142 Type 2 diabetes mellitus with diabetic polyneuropathy: Secondary | ICD-10-CM | POA: Diagnosis not present

## 2016-05-12 DIAGNOSIS — L02612 Cutaneous abscess of left foot: Secondary | ICD-10-CM | POA: Diagnosis not present

## 2016-05-13 DIAGNOSIS — L02612 Cutaneous abscess of left foot: Secondary | ICD-10-CM | POA: Diagnosis not present

## 2016-05-13 DIAGNOSIS — E1140 Type 2 diabetes mellitus with diabetic neuropathy, unspecified: Secondary | ICD-10-CM | POA: Diagnosis not present

## 2016-05-13 DIAGNOSIS — M869 Osteomyelitis, unspecified: Secondary | ICD-10-CM | POA: Diagnosis not present

## 2016-05-13 DIAGNOSIS — I10 Essential (primary) hypertension: Secondary | ICD-10-CM | POA: Diagnosis not present

## 2016-05-13 DIAGNOSIS — E1151 Type 2 diabetes mellitus with diabetic peripheral angiopathy without gangrene: Secondary | ICD-10-CM | POA: Diagnosis not present

## 2016-05-13 DIAGNOSIS — E1169 Type 2 diabetes mellitus with other specified complication: Secondary | ICD-10-CM | POA: Diagnosis not present

## 2016-05-13 DIAGNOSIS — E1142 Type 2 diabetes mellitus with diabetic polyneuropathy: Secondary | ICD-10-CM | POA: Diagnosis not present

## 2016-05-13 DIAGNOSIS — Z452 Encounter for adjustment and management of vascular access device: Secondary | ICD-10-CM | POA: Diagnosis not present

## 2016-05-13 DIAGNOSIS — M86472 Chronic osteomyelitis with draining sinus, left ankle and foot: Secondary | ICD-10-CM | POA: Diagnosis not present

## 2016-05-13 DIAGNOSIS — Z5181 Encounter for therapeutic drug level monitoring: Secondary | ICD-10-CM | POA: Diagnosis not present

## 2016-05-13 DIAGNOSIS — E1165 Type 2 diabetes mellitus with hyperglycemia: Secondary | ICD-10-CM | POA: Diagnosis not present

## 2016-05-13 DIAGNOSIS — E11621 Type 2 diabetes mellitus with foot ulcer: Secondary | ICD-10-CM | POA: Diagnosis not present

## 2016-05-13 DIAGNOSIS — L97524 Non-pressure chronic ulcer of other part of left foot with necrosis of bone: Secondary | ICD-10-CM | POA: Diagnosis not present

## 2016-05-13 DIAGNOSIS — Z792 Long term (current) use of antibiotics: Secondary | ICD-10-CM | POA: Diagnosis not present

## 2016-05-13 DIAGNOSIS — L03032 Cellulitis of left toe: Secondary | ICD-10-CM | POA: Diagnosis not present

## 2016-05-14 DIAGNOSIS — Z792 Long term (current) use of antibiotics: Secondary | ICD-10-CM | POA: Diagnosis not present

## 2016-05-14 DIAGNOSIS — E11621 Type 2 diabetes mellitus with foot ulcer: Secondary | ICD-10-CM | POA: Diagnosis not present

## 2016-05-14 DIAGNOSIS — E1169 Type 2 diabetes mellitus with other specified complication: Secondary | ICD-10-CM | POA: Diagnosis not present

## 2016-05-14 DIAGNOSIS — Z5181 Encounter for therapeutic drug level monitoring: Secondary | ICD-10-CM | POA: Diagnosis not present

## 2016-05-14 DIAGNOSIS — M86472 Chronic osteomyelitis with draining sinus, left ankle and foot: Secondary | ICD-10-CM | POA: Diagnosis not present

## 2016-05-14 DIAGNOSIS — E1142 Type 2 diabetes mellitus with diabetic polyneuropathy: Secondary | ICD-10-CM | POA: Diagnosis not present

## 2016-05-14 DIAGNOSIS — Z452 Encounter for adjustment and management of vascular access device: Secondary | ICD-10-CM | POA: Diagnosis not present

## 2016-05-14 DIAGNOSIS — I10 Essential (primary) hypertension: Secondary | ICD-10-CM | POA: Diagnosis not present

## 2016-05-14 DIAGNOSIS — M869 Osteomyelitis, unspecified: Secondary | ICD-10-CM | POA: Diagnosis not present

## 2016-05-14 DIAGNOSIS — E1140 Type 2 diabetes mellitus with diabetic neuropathy, unspecified: Secondary | ICD-10-CM | POA: Diagnosis not present

## 2016-05-14 DIAGNOSIS — L02612 Cutaneous abscess of left foot: Secondary | ICD-10-CM | POA: Diagnosis not present

## 2016-05-14 DIAGNOSIS — L97524 Non-pressure chronic ulcer of other part of left foot with necrosis of bone: Secondary | ICD-10-CM | POA: Diagnosis not present

## 2016-05-14 DIAGNOSIS — E1165 Type 2 diabetes mellitus with hyperglycemia: Secondary | ICD-10-CM | POA: Diagnosis not present

## 2016-05-14 DIAGNOSIS — L03032 Cellulitis of left toe: Secondary | ICD-10-CM | POA: Diagnosis not present

## 2016-05-14 DIAGNOSIS — E1151 Type 2 diabetes mellitus with diabetic peripheral angiopathy without gangrene: Secondary | ICD-10-CM | POA: Diagnosis not present

## 2016-05-16 DIAGNOSIS — L02612 Cutaneous abscess of left foot: Secondary | ICD-10-CM | POA: Diagnosis not present

## 2016-05-16 DIAGNOSIS — E1169 Type 2 diabetes mellitus with other specified complication: Secondary | ICD-10-CM | POA: Diagnosis not present

## 2016-05-16 DIAGNOSIS — E1142 Type 2 diabetes mellitus with diabetic polyneuropathy: Secondary | ICD-10-CM | POA: Diagnosis not present

## 2016-05-16 DIAGNOSIS — E1165 Type 2 diabetes mellitus with hyperglycemia: Secondary | ICD-10-CM | POA: Diagnosis not present

## 2016-05-16 DIAGNOSIS — Z792 Long term (current) use of antibiotics: Secondary | ICD-10-CM | POA: Diagnosis not present

## 2016-05-16 DIAGNOSIS — E1151 Type 2 diabetes mellitus with diabetic peripheral angiopathy without gangrene: Secondary | ICD-10-CM | POA: Diagnosis not present

## 2016-05-16 DIAGNOSIS — E11621 Type 2 diabetes mellitus with foot ulcer: Secondary | ICD-10-CM | POA: Diagnosis not present

## 2016-05-16 DIAGNOSIS — L03032 Cellulitis of left toe: Secondary | ICD-10-CM | POA: Diagnosis not present

## 2016-05-16 DIAGNOSIS — L97524 Non-pressure chronic ulcer of other part of left foot with necrosis of bone: Secondary | ICD-10-CM | POA: Diagnosis not present

## 2016-05-16 DIAGNOSIS — E1140 Type 2 diabetes mellitus with diabetic neuropathy, unspecified: Secondary | ICD-10-CM | POA: Diagnosis not present

## 2016-05-16 DIAGNOSIS — M86472 Chronic osteomyelitis with draining sinus, left ankle and foot: Secondary | ICD-10-CM | POA: Diagnosis not present

## 2016-05-16 DIAGNOSIS — I10 Essential (primary) hypertension: Secondary | ICD-10-CM | POA: Diagnosis not present

## 2016-05-16 DIAGNOSIS — M869 Osteomyelitis, unspecified: Secondary | ICD-10-CM | POA: Diagnosis not present

## 2016-05-16 DIAGNOSIS — Z5181 Encounter for therapeutic drug level monitoring: Secondary | ICD-10-CM | POA: Diagnosis not present

## 2016-05-16 DIAGNOSIS — Z452 Encounter for adjustment and management of vascular access device: Secondary | ICD-10-CM | POA: Diagnosis not present

## 2016-05-17 DIAGNOSIS — L97524 Non-pressure chronic ulcer of other part of left foot with necrosis of bone: Secondary | ICD-10-CM | POA: Diagnosis not present

## 2016-05-17 DIAGNOSIS — E11621 Type 2 diabetes mellitus with foot ulcer: Secondary | ICD-10-CM | POA: Diagnosis not present

## 2016-05-17 DIAGNOSIS — L02612 Cutaneous abscess of left foot: Secondary | ICD-10-CM | POA: Diagnosis not present

## 2016-05-17 DIAGNOSIS — M86472 Chronic osteomyelitis with draining sinus, left ankle and foot: Secondary | ICD-10-CM | POA: Diagnosis not present

## 2016-05-19 DIAGNOSIS — L97524 Non-pressure chronic ulcer of other part of left foot with necrosis of bone: Secondary | ICD-10-CM | POA: Diagnosis not present

## 2016-05-19 DIAGNOSIS — E1142 Type 2 diabetes mellitus with diabetic polyneuropathy: Secondary | ICD-10-CM | POA: Diagnosis not present

## 2016-05-19 DIAGNOSIS — I10 Essential (primary) hypertension: Secondary | ICD-10-CM | POA: Diagnosis not present

## 2016-05-19 DIAGNOSIS — E1151 Type 2 diabetes mellitus with diabetic peripheral angiopathy without gangrene: Secondary | ICD-10-CM | POA: Diagnosis not present

## 2016-05-19 DIAGNOSIS — L02612 Cutaneous abscess of left foot: Secondary | ICD-10-CM | POA: Diagnosis not present

## 2016-05-19 DIAGNOSIS — M86472 Chronic osteomyelitis with draining sinus, left ankle and foot: Secondary | ICD-10-CM | POA: Diagnosis not present

## 2016-05-19 DIAGNOSIS — Z792 Long term (current) use of antibiotics: Secondary | ICD-10-CM | POA: Diagnosis not present

## 2016-05-19 DIAGNOSIS — E1165 Type 2 diabetes mellitus with hyperglycemia: Secondary | ICD-10-CM | POA: Diagnosis not present

## 2016-05-19 DIAGNOSIS — E1169 Type 2 diabetes mellitus with other specified complication: Secondary | ICD-10-CM | POA: Diagnosis not present

## 2016-05-19 DIAGNOSIS — M869 Osteomyelitis, unspecified: Secondary | ICD-10-CM | POA: Diagnosis not present

## 2016-05-19 DIAGNOSIS — Z5181 Encounter for therapeutic drug level monitoring: Secondary | ICD-10-CM | POA: Diagnosis not present

## 2016-05-19 DIAGNOSIS — Z452 Encounter for adjustment and management of vascular access device: Secondary | ICD-10-CM | POA: Diagnosis not present

## 2016-05-19 DIAGNOSIS — E1140 Type 2 diabetes mellitus with diabetic neuropathy, unspecified: Secondary | ICD-10-CM | POA: Diagnosis not present

## 2016-05-19 DIAGNOSIS — L03032 Cellulitis of left toe: Secondary | ICD-10-CM | POA: Diagnosis not present

## 2016-05-19 DIAGNOSIS — E11621 Type 2 diabetes mellitus with foot ulcer: Secondary | ICD-10-CM | POA: Diagnosis not present

## 2016-05-21 DIAGNOSIS — Z792 Long term (current) use of antibiotics: Secondary | ICD-10-CM | POA: Diagnosis not present

## 2016-05-21 DIAGNOSIS — L97524 Non-pressure chronic ulcer of other part of left foot with necrosis of bone: Secondary | ICD-10-CM | POA: Diagnosis not present

## 2016-05-21 DIAGNOSIS — E1151 Type 2 diabetes mellitus with diabetic peripheral angiopathy without gangrene: Secondary | ICD-10-CM | POA: Diagnosis not present

## 2016-05-21 DIAGNOSIS — Z452 Encounter for adjustment and management of vascular access device: Secondary | ICD-10-CM | POA: Diagnosis not present

## 2016-05-21 DIAGNOSIS — L02612 Cutaneous abscess of left foot: Secondary | ICD-10-CM | POA: Diagnosis not present

## 2016-05-21 DIAGNOSIS — L03032 Cellulitis of left toe: Secondary | ICD-10-CM | POA: Diagnosis not present

## 2016-05-21 DIAGNOSIS — E1140 Type 2 diabetes mellitus with diabetic neuropathy, unspecified: Secondary | ICD-10-CM | POA: Diagnosis not present

## 2016-05-21 DIAGNOSIS — E1169 Type 2 diabetes mellitus with other specified complication: Secondary | ICD-10-CM | POA: Diagnosis not present

## 2016-05-21 DIAGNOSIS — E1142 Type 2 diabetes mellitus with diabetic polyneuropathy: Secondary | ICD-10-CM | POA: Diagnosis not present

## 2016-05-21 DIAGNOSIS — M86472 Chronic osteomyelitis with draining sinus, left ankle and foot: Secondary | ICD-10-CM | POA: Diagnosis not present

## 2016-05-21 DIAGNOSIS — E11621 Type 2 diabetes mellitus with foot ulcer: Secondary | ICD-10-CM | POA: Diagnosis not present

## 2016-05-21 DIAGNOSIS — Z5181 Encounter for therapeutic drug level monitoring: Secondary | ICD-10-CM | POA: Diagnosis not present

## 2016-05-21 DIAGNOSIS — M869 Osteomyelitis, unspecified: Secondary | ICD-10-CM | POA: Diagnosis not present

## 2016-05-21 DIAGNOSIS — I10 Essential (primary) hypertension: Secondary | ICD-10-CM | POA: Diagnosis not present

## 2016-05-21 DIAGNOSIS — E1165 Type 2 diabetes mellitus with hyperglycemia: Secondary | ICD-10-CM | POA: Diagnosis not present

## 2016-05-22 DIAGNOSIS — E1165 Type 2 diabetes mellitus with hyperglycemia: Secondary | ICD-10-CM | POA: Diagnosis not present

## 2016-05-22 DIAGNOSIS — L03116 Cellulitis of left lower limb: Secondary | ICD-10-CM | POA: Diagnosis not present

## 2016-05-22 DIAGNOSIS — G4700 Insomnia, unspecified: Secondary | ICD-10-CM | POA: Diagnosis not present

## 2016-05-22 DIAGNOSIS — Z79899 Other long term (current) drug therapy: Secondary | ICD-10-CM | POA: Diagnosis not present

## 2016-05-23 DIAGNOSIS — L97524 Non-pressure chronic ulcer of other part of left foot with necrosis of bone: Secondary | ICD-10-CM | POA: Diagnosis not present

## 2016-05-23 DIAGNOSIS — L02612 Cutaneous abscess of left foot: Secondary | ICD-10-CM | POA: Diagnosis not present

## 2016-05-23 DIAGNOSIS — M86472 Chronic osteomyelitis with draining sinus, left ankle and foot: Secondary | ICD-10-CM | POA: Diagnosis not present

## 2016-05-23 DIAGNOSIS — E11621 Type 2 diabetes mellitus with foot ulcer: Secondary | ICD-10-CM | POA: Diagnosis not present

## 2016-05-26 DIAGNOSIS — M869 Osteomyelitis, unspecified: Secondary | ICD-10-CM | POA: Diagnosis not present

## 2016-05-26 DIAGNOSIS — E1151 Type 2 diabetes mellitus with diabetic peripheral angiopathy without gangrene: Secondary | ICD-10-CM | POA: Diagnosis not present

## 2016-05-26 DIAGNOSIS — M86472 Chronic osteomyelitis with draining sinus, left ankle and foot: Secondary | ICD-10-CM | POA: Diagnosis not present

## 2016-05-26 DIAGNOSIS — L97524 Non-pressure chronic ulcer of other part of left foot with necrosis of bone: Secondary | ICD-10-CM | POA: Diagnosis not present

## 2016-05-26 DIAGNOSIS — E1165 Type 2 diabetes mellitus with hyperglycemia: Secondary | ICD-10-CM | POA: Diagnosis not present

## 2016-05-26 DIAGNOSIS — I10 Essential (primary) hypertension: Secondary | ICD-10-CM | POA: Diagnosis not present

## 2016-05-26 DIAGNOSIS — Z792 Long term (current) use of antibiotics: Secondary | ICD-10-CM | POA: Diagnosis not present

## 2016-05-26 DIAGNOSIS — E11621 Type 2 diabetes mellitus with foot ulcer: Secondary | ICD-10-CM | POA: Diagnosis not present

## 2016-05-26 DIAGNOSIS — L02612 Cutaneous abscess of left foot: Secondary | ICD-10-CM | POA: Diagnosis not present

## 2016-05-26 DIAGNOSIS — Z452 Encounter for adjustment and management of vascular access device: Secondary | ICD-10-CM | POA: Diagnosis not present

## 2016-05-26 DIAGNOSIS — L97523 Non-pressure chronic ulcer of other part of left foot with necrosis of muscle: Secondary | ICD-10-CM | POA: Diagnosis not present

## 2016-05-26 DIAGNOSIS — E1142 Type 2 diabetes mellitus with diabetic polyneuropathy: Secondary | ICD-10-CM | POA: Diagnosis not present

## 2016-05-26 DIAGNOSIS — Z5181 Encounter for therapeutic drug level monitoring: Secondary | ICD-10-CM | POA: Diagnosis not present

## 2016-05-26 DIAGNOSIS — E1169 Type 2 diabetes mellitus with other specified complication: Secondary | ICD-10-CM | POA: Diagnosis not present

## 2016-05-26 DIAGNOSIS — L03032 Cellulitis of left toe: Secondary | ICD-10-CM | POA: Diagnosis not present

## 2016-05-29 DIAGNOSIS — Z5181 Encounter for therapeutic drug level monitoring: Secondary | ICD-10-CM | POA: Diagnosis not present

## 2016-05-29 DIAGNOSIS — E1151 Type 2 diabetes mellitus with diabetic peripheral angiopathy without gangrene: Secondary | ICD-10-CM | POA: Diagnosis not present

## 2016-05-29 DIAGNOSIS — M86472 Chronic osteomyelitis with draining sinus, left ankle and foot: Secondary | ICD-10-CM | POA: Diagnosis not present

## 2016-05-29 DIAGNOSIS — Z792 Long term (current) use of antibiotics: Secondary | ICD-10-CM | POA: Diagnosis not present

## 2016-05-29 DIAGNOSIS — L97524 Non-pressure chronic ulcer of other part of left foot with necrosis of bone: Secondary | ICD-10-CM | POA: Diagnosis not present

## 2016-05-29 DIAGNOSIS — E11621 Type 2 diabetes mellitus with foot ulcer: Secondary | ICD-10-CM | POA: Diagnosis not present

## 2016-05-29 DIAGNOSIS — E1142 Type 2 diabetes mellitus with diabetic polyneuropathy: Secondary | ICD-10-CM | POA: Diagnosis not present

## 2016-05-29 DIAGNOSIS — L03032 Cellulitis of left toe: Secondary | ICD-10-CM | POA: Diagnosis not present

## 2016-05-29 DIAGNOSIS — Z452 Encounter for adjustment and management of vascular access device: Secondary | ICD-10-CM | POA: Diagnosis not present

## 2016-05-29 DIAGNOSIS — E1165 Type 2 diabetes mellitus with hyperglycemia: Secondary | ICD-10-CM | POA: Diagnosis not present

## 2016-05-29 DIAGNOSIS — E1169 Type 2 diabetes mellitus with other specified complication: Secondary | ICD-10-CM | POA: Diagnosis not present

## 2016-05-29 DIAGNOSIS — L02612 Cutaneous abscess of left foot: Secondary | ICD-10-CM | POA: Diagnosis not present

## 2016-05-29 DIAGNOSIS — I10 Essential (primary) hypertension: Secondary | ICD-10-CM | POA: Diagnosis not present

## 2016-05-31 DIAGNOSIS — Z89421 Acquired absence of other right toe(s): Secondary | ICD-10-CM | POA: Diagnosis not present

## 2016-05-31 DIAGNOSIS — Z452 Encounter for adjustment and management of vascular access device: Secondary | ICD-10-CM | POA: Diagnosis not present

## 2016-05-31 DIAGNOSIS — E785 Hyperlipidemia, unspecified: Secondary | ICD-10-CM | POA: Diagnosis not present

## 2016-05-31 DIAGNOSIS — T82524A Displacement of infusion catheter, initial encounter: Secondary | ICD-10-CM | POA: Diagnosis not present

## 2016-05-31 DIAGNOSIS — M5136 Other intervertebral disc degeneration, lumbar region: Secondary | ICD-10-CM | POA: Diagnosis not present

## 2016-05-31 DIAGNOSIS — G5700 Lesion of sciatic nerve, unspecified lower limb: Secondary | ICD-10-CM | POA: Diagnosis not present

## 2016-05-31 DIAGNOSIS — W109XXA Fall (on) (from) unspecified stairs and steps, initial encounter: Secondary | ICD-10-CM | POA: Diagnosis not present

## 2016-05-31 DIAGNOSIS — D649 Anemia, unspecified: Secondary | ICD-10-CM | POA: Diagnosis not present

## 2016-05-31 DIAGNOSIS — E1142 Type 2 diabetes mellitus with diabetic polyneuropathy: Secondary | ICD-10-CM | POA: Diagnosis not present

## 2016-05-31 DIAGNOSIS — M1990 Unspecified osteoarthritis, unspecified site: Secondary | ICD-10-CM | POA: Diagnosis not present

## 2016-05-31 DIAGNOSIS — L02612 Cutaneous abscess of left foot: Secondary | ICD-10-CM | POA: Diagnosis not present

## 2016-05-31 DIAGNOSIS — Z89422 Acquired absence of other left toe(s): Secondary | ICD-10-CM | POA: Diagnosis not present

## 2016-05-31 DIAGNOSIS — Z89412 Acquired absence of left great toe: Secondary | ICD-10-CM | POA: Diagnosis not present

## 2016-05-31 DIAGNOSIS — M545 Low back pain: Secondary | ICD-10-CM | POA: Diagnosis not present

## 2016-05-31 DIAGNOSIS — M25552 Pain in left hip: Secondary | ICD-10-CM | POA: Diagnosis not present

## 2016-05-31 DIAGNOSIS — K5790 Diverticulosis of intestine, part unspecified, without perforation or abscess without bleeding: Secondary | ICD-10-CM | POA: Diagnosis not present

## 2016-05-31 DIAGNOSIS — E11618 Type 2 diabetes mellitus with other diabetic arthropathy: Secondary | ICD-10-CM | POA: Diagnosis not present

## 2016-05-31 DIAGNOSIS — Z9889 Other specified postprocedural states: Secondary | ICD-10-CM | POA: Diagnosis not present

## 2016-05-31 DIAGNOSIS — Z79899 Other long term (current) drug therapy: Secondary | ICD-10-CM | POA: Diagnosis not present

## 2016-05-31 DIAGNOSIS — Z87891 Personal history of nicotine dependence: Secondary | ICD-10-CM | POA: Diagnosis not present

## 2016-05-31 DIAGNOSIS — M90571 Osteonecrosis in diseases classified elsewhere, right ankle and foot: Secondary | ICD-10-CM | POA: Diagnosis not present

## 2016-05-31 DIAGNOSIS — M86472 Chronic osteomyelitis with draining sinus, left ankle and foot: Secondary | ICD-10-CM | POA: Diagnosis not present

## 2016-05-31 DIAGNOSIS — T8249XA Other complication of vascular dialysis catheter, initial encounter: Secondary | ICD-10-CM | POA: Diagnosis not present

## 2016-05-31 DIAGNOSIS — E871 Hypo-osmolality and hyponatremia: Secondary | ICD-10-CM | POA: Diagnosis not present

## 2016-05-31 DIAGNOSIS — S3992XA Unspecified injury of lower back, initial encounter: Secondary | ICD-10-CM | POA: Diagnosis not present

## 2016-05-31 DIAGNOSIS — M25551 Pain in right hip: Secondary | ICD-10-CM | POA: Diagnosis not present

## 2016-05-31 DIAGNOSIS — E11621 Type 2 diabetes mellitus with foot ulcer: Secondary | ICD-10-CM | POA: Diagnosis not present

## 2016-05-31 DIAGNOSIS — L97524 Non-pressure chronic ulcer of other part of left foot with necrosis of bone: Secondary | ICD-10-CM | POA: Diagnosis not present

## 2016-05-31 DIAGNOSIS — M869 Osteomyelitis, unspecified: Secondary | ICD-10-CM | POA: Diagnosis not present

## 2016-05-31 DIAGNOSIS — I10 Essential (primary) hypertension: Secondary | ICD-10-CM | POA: Diagnosis not present

## 2016-05-31 DIAGNOSIS — S79912A Unspecified injury of left hip, initial encounter: Secondary | ICD-10-CM | POA: Diagnosis not present

## 2016-05-31 DIAGNOSIS — G8929 Other chronic pain: Secondary | ICD-10-CM | POA: Diagnosis not present

## 2016-05-31 DIAGNOSIS — Z794 Long term (current) use of insulin: Secondary | ICD-10-CM | POA: Diagnosis not present

## 2016-05-31 DIAGNOSIS — Z961 Presence of intraocular lens: Secondary | ICD-10-CM | POA: Diagnosis not present

## 2016-05-31 DIAGNOSIS — E1152 Type 2 diabetes mellitus with diabetic peripheral angiopathy with gangrene: Secondary | ICD-10-CM | POA: Diagnosis not present

## 2016-05-31 DIAGNOSIS — S3993XA Unspecified injury of pelvis, initial encounter: Secondary | ICD-10-CM | POA: Diagnosis not present

## 2016-05-31 DIAGNOSIS — T829XXA Unspecified complication of cardiac and vascular prosthetic device, implant and graft, initial encounter: Secondary | ICD-10-CM | POA: Diagnosis not present

## 2016-06-02 DIAGNOSIS — E1142 Type 2 diabetes mellitus with diabetic polyneuropathy: Secondary | ICD-10-CM | POA: Diagnosis not present

## 2016-06-02 DIAGNOSIS — E11621 Type 2 diabetes mellitus with foot ulcer: Secondary | ICD-10-CM | POA: Diagnosis not present

## 2016-06-02 DIAGNOSIS — I10 Essential (primary) hypertension: Secondary | ICD-10-CM | POA: Diagnosis not present

## 2016-06-02 DIAGNOSIS — Z452 Encounter for adjustment and management of vascular access device: Secondary | ICD-10-CM | POA: Diagnosis not present

## 2016-06-02 DIAGNOSIS — L02612 Cutaneous abscess of left foot: Secondary | ICD-10-CM | POA: Diagnosis not present

## 2016-06-02 DIAGNOSIS — Z792 Long term (current) use of antibiotics: Secondary | ICD-10-CM | POA: Diagnosis not present

## 2016-06-02 DIAGNOSIS — M86472 Chronic osteomyelitis with draining sinus, left ankle and foot: Secondary | ICD-10-CM | POA: Diagnosis not present

## 2016-06-02 DIAGNOSIS — E1151 Type 2 diabetes mellitus with diabetic peripheral angiopathy without gangrene: Secondary | ICD-10-CM | POA: Diagnosis not present

## 2016-06-02 DIAGNOSIS — M869 Osteomyelitis, unspecified: Secondary | ICD-10-CM | POA: Diagnosis not present

## 2016-06-02 DIAGNOSIS — E1165 Type 2 diabetes mellitus with hyperglycemia: Secondary | ICD-10-CM | POA: Diagnosis not present

## 2016-06-02 DIAGNOSIS — L97524 Non-pressure chronic ulcer of other part of left foot with necrosis of bone: Secondary | ICD-10-CM | POA: Diagnosis not present

## 2016-06-02 DIAGNOSIS — Z5181 Encounter for therapeutic drug level monitoring: Secondary | ICD-10-CM | POA: Diagnosis not present

## 2016-06-02 DIAGNOSIS — E1169 Type 2 diabetes mellitus with other specified complication: Secondary | ICD-10-CM | POA: Diagnosis not present

## 2016-06-02 DIAGNOSIS — L03032 Cellulitis of left toe: Secondary | ICD-10-CM | POA: Diagnosis not present

## 2016-06-05 DIAGNOSIS — Z792 Long term (current) use of antibiotics: Secondary | ICD-10-CM | POA: Diagnosis not present

## 2016-06-05 DIAGNOSIS — Z452 Encounter for adjustment and management of vascular access device: Secondary | ICD-10-CM | POA: Diagnosis not present

## 2016-06-05 DIAGNOSIS — M86472 Chronic osteomyelitis with draining sinus, left ankle and foot: Secondary | ICD-10-CM | POA: Diagnosis not present

## 2016-06-05 DIAGNOSIS — E1169 Type 2 diabetes mellitus with other specified complication: Secondary | ICD-10-CM | POA: Diagnosis not present

## 2016-06-05 DIAGNOSIS — L02612 Cutaneous abscess of left foot: Secondary | ICD-10-CM | POA: Diagnosis not present

## 2016-06-05 DIAGNOSIS — L03032 Cellulitis of left toe: Secondary | ICD-10-CM | POA: Diagnosis not present

## 2016-06-05 DIAGNOSIS — E1151 Type 2 diabetes mellitus with diabetic peripheral angiopathy without gangrene: Secondary | ICD-10-CM | POA: Diagnosis not present

## 2016-06-05 DIAGNOSIS — E11621 Type 2 diabetes mellitus with foot ulcer: Secondary | ICD-10-CM | POA: Diagnosis not present

## 2016-06-05 DIAGNOSIS — E1142 Type 2 diabetes mellitus with diabetic polyneuropathy: Secondary | ICD-10-CM | POA: Diagnosis not present

## 2016-06-05 DIAGNOSIS — E1165 Type 2 diabetes mellitus with hyperglycemia: Secondary | ICD-10-CM | POA: Diagnosis not present

## 2016-06-05 DIAGNOSIS — L97524 Non-pressure chronic ulcer of other part of left foot with necrosis of bone: Secondary | ICD-10-CM | POA: Diagnosis not present

## 2016-06-05 DIAGNOSIS — I10 Essential (primary) hypertension: Secondary | ICD-10-CM | POA: Diagnosis not present

## 2016-06-05 DIAGNOSIS — Z5181 Encounter for therapeutic drug level monitoring: Secondary | ICD-10-CM | POA: Diagnosis not present

## 2016-06-07 DIAGNOSIS — L02612 Cutaneous abscess of left foot: Secondary | ICD-10-CM | POA: Diagnosis not present

## 2016-06-07 DIAGNOSIS — L97524 Non-pressure chronic ulcer of other part of left foot with necrosis of bone: Secondary | ICD-10-CM | POA: Diagnosis not present

## 2016-06-07 DIAGNOSIS — M86472 Chronic osteomyelitis with draining sinus, left ankle and foot: Secondary | ICD-10-CM | POA: Diagnosis not present

## 2016-06-07 DIAGNOSIS — E11621 Type 2 diabetes mellitus with foot ulcer: Secondary | ICD-10-CM | POA: Diagnosis not present

## 2016-06-09 DIAGNOSIS — M86472 Chronic osteomyelitis with draining sinus, left ankle and foot: Secondary | ICD-10-CM | POA: Diagnosis not present

## 2016-06-09 DIAGNOSIS — E1151 Type 2 diabetes mellitus with diabetic peripheral angiopathy without gangrene: Secondary | ICD-10-CM | POA: Diagnosis not present

## 2016-06-09 DIAGNOSIS — L02612 Cutaneous abscess of left foot: Secondary | ICD-10-CM | POA: Diagnosis not present

## 2016-06-09 DIAGNOSIS — E1169 Type 2 diabetes mellitus with other specified complication: Secondary | ICD-10-CM | POA: Diagnosis not present

## 2016-06-09 DIAGNOSIS — E1142 Type 2 diabetes mellitus with diabetic polyneuropathy: Secondary | ICD-10-CM | POA: Diagnosis not present

## 2016-06-09 DIAGNOSIS — Z452 Encounter for adjustment and management of vascular access device: Secondary | ICD-10-CM | POA: Diagnosis not present

## 2016-06-09 DIAGNOSIS — L97524 Non-pressure chronic ulcer of other part of left foot with necrosis of bone: Secondary | ICD-10-CM | POA: Diagnosis not present

## 2016-06-09 DIAGNOSIS — Z792 Long term (current) use of antibiotics: Secondary | ICD-10-CM | POA: Diagnosis not present

## 2016-06-09 DIAGNOSIS — I10 Essential (primary) hypertension: Secondary | ICD-10-CM | POA: Diagnosis not present

## 2016-06-09 DIAGNOSIS — E1165 Type 2 diabetes mellitus with hyperglycemia: Secondary | ICD-10-CM | POA: Diagnosis not present

## 2016-06-09 DIAGNOSIS — E11621 Type 2 diabetes mellitus with foot ulcer: Secondary | ICD-10-CM | POA: Diagnosis not present

## 2016-06-09 DIAGNOSIS — E869 Volume depletion, unspecified: Secondary | ICD-10-CM | POA: Diagnosis not present

## 2016-06-09 DIAGNOSIS — L03032 Cellulitis of left toe: Secondary | ICD-10-CM | POA: Diagnosis not present

## 2016-06-09 DIAGNOSIS — Z5181 Encounter for therapeutic drug level monitoring: Secondary | ICD-10-CM | POA: Diagnosis not present

## 2016-06-12 DIAGNOSIS — I10 Essential (primary) hypertension: Secondary | ICD-10-CM | POA: Diagnosis not present

## 2016-06-12 DIAGNOSIS — Z1389 Encounter for screening for other disorder: Secondary | ICD-10-CM | POA: Diagnosis not present

## 2016-06-12 DIAGNOSIS — E785 Hyperlipidemia, unspecified: Secondary | ICD-10-CM | POA: Diagnosis not present

## 2016-06-12 DIAGNOSIS — E1165 Type 2 diabetes mellitus with hyperglycemia: Secondary | ICD-10-CM | POA: Diagnosis not present

## 2016-06-13 DIAGNOSIS — L03032 Cellulitis of left toe: Secondary | ICD-10-CM | POA: Diagnosis not present

## 2016-06-13 DIAGNOSIS — Z452 Encounter for adjustment and management of vascular access device: Secondary | ICD-10-CM | POA: Diagnosis not present

## 2016-06-13 DIAGNOSIS — M86472 Chronic osteomyelitis with draining sinus, left ankle and foot: Secondary | ICD-10-CM | POA: Diagnosis not present

## 2016-06-13 DIAGNOSIS — E1165 Type 2 diabetes mellitus with hyperglycemia: Secondary | ICD-10-CM | POA: Diagnosis not present

## 2016-06-13 DIAGNOSIS — I10 Essential (primary) hypertension: Secondary | ICD-10-CM | POA: Diagnosis not present

## 2016-06-13 DIAGNOSIS — E11621 Type 2 diabetes mellitus with foot ulcer: Secondary | ICD-10-CM | POA: Diagnosis not present

## 2016-06-13 DIAGNOSIS — Z792 Long term (current) use of antibiotics: Secondary | ICD-10-CM | POA: Diagnosis not present

## 2016-06-13 DIAGNOSIS — L97524 Non-pressure chronic ulcer of other part of left foot with necrosis of bone: Secondary | ICD-10-CM | POA: Diagnosis not present

## 2016-06-13 DIAGNOSIS — Z5181 Encounter for therapeutic drug level monitoring: Secondary | ICD-10-CM | POA: Diagnosis not present

## 2016-06-13 DIAGNOSIS — E1142 Type 2 diabetes mellitus with diabetic polyneuropathy: Secondary | ICD-10-CM | POA: Diagnosis not present

## 2016-06-13 DIAGNOSIS — E1151 Type 2 diabetes mellitus with diabetic peripheral angiopathy without gangrene: Secondary | ICD-10-CM | POA: Diagnosis not present

## 2016-06-13 DIAGNOSIS — L02612 Cutaneous abscess of left foot: Secondary | ICD-10-CM | POA: Diagnosis not present

## 2016-06-13 DIAGNOSIS — E1169 Type 2 diabetes mellitus with other specified complication: Secondary | ICD-10-CM | POA: Diagnosis not present

## 2016-06-14 DIAGNOSIS — L97524 Non-pressure chronic ulcer of other part of left foot with necrosis of bone: Secondary | ICD-10-CM | POA: Diagnosis not present

## 2016-06-14 DIAGNOSIS — L02612 Cutaneous abscess of left foot: Secondary | ICD-10-CM | POA: Diagnosis not present

## 2016-06-14 DIAGNOSIS — M86472 Chronic osteomyelitis with draining sinus, left ankle and foot: Secondary | ICD-10-CM | POA: Diagnosis not present

## 2016-06-14 DIAGNOSIS — E11621 Type 2 diabetes mellitus with foot ulcer: Secondary | ICD-10-CM | POA: Diagnosis not present

## 2016-06-15 DIAGNOSIS — L02612 Cutaneous abscess of left foot: Secondary | ICD-10-CM | POA: Diagnosis not present

## 2016-06-15 DIAGNOSIS — E11621 Type 2 diabetes mellitus with foot ulcer: Secondary | ICD-10-CM | POA: Diagnosis not present

## 2016-06-15 DIAGNOSIS — E1142 Type 2 diabetes mellitus with diabetic polyneuropathy: Secondary | ICD-10-CM | POA: Diagnosis not present

## 2016-06-15 DIAGNOSIS — M869 Osteomyelitis, unspecified: Secondary | ICD-10-CM | POA: Diagnosis not present

## 2016-06-15 DIAGNOSIS — E1151 Type 2 diabetes mellitus with diabetic peripheral angiopathy without gangrene: Secondary | ICD-10-CM | POA: Diagnosis not present

## 2016-06-15 DIAGNOSIS — Z792 Long term (current) use of antibiotics: Secondary | ICD-10-CM | POA: Diagnosis not present

## 2016-06-15 DIAGNOSIS — E1165 Type 2 diabetes mellitus with hyperglycemia: Secondary | ICD-10-CM | POA: Diagnosis not present

## 2016-06-15 DIAGNOSIS — Z5181 Encounter for therapeutic drug level monitoring: Secondary | ICD-10-CM | POA: Diagnosis not present

## 2016-06-15 DIAGNOSIS — E1169 Type 2 diabetes mellitus with other specified complication: Secondary | ICD-10-CM | POA: Diagnosis not present

## 2016-06-15 DIAGNOSIS — L03032 Cellulitis of left toe: Secondary | ICD-10-CM | POA: Diagnosis not present

## 2016-06-15 DIAGNOSIS — M86472 Chronic osteomyelitis with draining sinus, left ankle and foot: Secondary | ICD-10-CM | POA: Diagnosis not present

## 2016-06-15 DIAGNOSIS — L97524 Non-pressure chronic ulcer of other part of left foot with necrosis of bone: Secondary | ICD-10-CM | POA: Diagnosis not present

## 2016-06-15 DIAGNOSIS — Z452 Encounter for adjustment and management of vascular access device: Secondary | ICD-10-CM | POA: Diagnosis not present

## 2016-06-15 DIAGNOSIS — I10 Essential (primary) hypertension: Secondary | ICD-10-CM | POA: Diagnosis not present

## 2016-06-18 DIAGNOSIS — E1142 Type 2 diabetes mellitus with diabetic polyneuropathy: Secondary | ICD-10-CM | POA: Diagnosis not present

## 2016-06-18 DIAGNOSIS — L97523 Non-pressure chronic ulcer of other part of left foot with necrosis of muscle: Secondary | ICD-10-CM | POA: Diagnosis not present

## 2016-06-20 DIAGNOSIS — E1142 Type 2 diabetes mellitus with diabetic polyneuropathy: Secondary | ICD-10-CM | POA: Diagnosis not present

## 2016-06-20 DIAGNOSIS — Z452 Encounter for adjustment and management of vascular access device: Secondary | ICD-10-CM | POA: Diagnosis not present

## 2016-06-20 DIAGNOSIS — M86472 Chronic osteomyelitis with draining sinus, left ankle and foot: Secondary | ICD-10-CM | POA: Diagnosis not present

## 2016-06-20 DIAGNOSIS — E1165 Type 2 diabetes mellitus with hyperglycemia: Secondary | ICD-10-CM | POA: Diagnosis not present

## 2016-06-20 DIAGNOSIS — E1169 Type 2 diabetes mellitus with other specified complication: Secondary | ICD-10-CM | POA: Diagnosis not present

## 2016-06-20 DIAGNOSIS — E1151 Type 2 diabetes mellitus with diabetic peripheral angiopathy without gangrene: Secondary | ICD-10-CM | POA: Diagnosis not present

## 2016-06-20 DIAGNOSIS — L03032 Cellulitis of left toe: Secondary | ICD-10-CM | POA: Diagnosis not present

## 2016-06-20 DIAGNOSIS — I10 Essential (primary) hypertension: Secondary | ICD-10-CM | POA: Diagnosis not present

## 2016-06-20 DIAGNOSIS — Z5181 Encounter for therapeutic drug level monitoring: Secondary | ICD-10-CM | POA: Diagnosis not present

## 2016-06-20 DIAGNOSIS — L02612 Cutaneous abscess of left foot: Secondary | ICD-10-CM | POA: Diagnosis not present

## 2016-06-20 DIAGNOSIS — E11621 Type 2 diabetes mellitus with foot ulcer: Secondary | ICD-10-CM | POA: Diagnosis not present

## 2016-06-20 DIAGNOSIS — Z792 Long term (current) use of antibiotics: Secondary | ICD-10-CM | POA: Diagnosis not present

## 2016-06-20 DIAGNOSIS — L97524 Non-pressure chronic ulcer of other part of left foot with necrosis of bone: Secondary | ICD-10-CM | POA: Diagnosis not present

## 2016-06-24 DIAGNOSIS — L03032 Cellulitis of left toe: Secondary | ICD-10-CM | POA: Diagnosis not present

## 2016-06-24 DIAGNOSIS — E1165 Type 2 diabetes mellitus with hyperglycemia: Secondary | ICD-10-CM | POA: Diagnosis not present

## 2016-06-24 DIAGNOSIS — Z5181 Encounter for therapeutic drug level monitoring: Secondary | ICD-10-CM | POA: Diagnosis not present

## 2016-06-24 DIAGNOSIS — L97524 Non-pressure chronic ulcer of other part of left foot with necrosis of bone: Secondary | ICD-10-CM | POA: Diagnosis not present

## 2016-06-24 DIAGNOSIS — E1151 Type 2 diabetes mellitus with diabetic peripheral angiopathy without gangrene: Secondary | ICD-10-CM | POA: Diagnosis not present

## 2016-06-24 DIAGNOSIS — Z452 Encounter for adjustment and management of vascular access device: Secondary | ICD-10-CM | POA: Diagnosis not present

## 2016-06-24 DIAGNOSIS — Z792 Long term (current) use of antibiotics: Secondary | ICD-10-CM | POA: Diagnosis not present

## 2016-06-24 DIAGNOSIS — L02612 Cutaneous abscess of left foot: Secondary | ICD-10-CM | POA: Diagnosis not present

## 2016-06-24 DIAGNOSIS — E1142 Type 2 diabetes mellitus with diabetic polyneuropathy: Secondary | ICD-10-CM | POA: Diagnosis not present

## 2016-06-24 DIAGNOSIS — M86472 Chronic osteomyelitis with draining sinus, left ankle and foot: Secondary | ICD-10-CM | POA: Diagnosis not present

## 2016-06-24 DIAGNOSIS — I10 Essential (primary) hypertension: Secondary | ICD-10-CM | POA: Diagnosis not present

## 2016-06-24 DIAGNOSIS — E11621 Type 2 diabetes mellitus with foot ulcer: Secondary | ICD-10-CM | POA: Diagnosis not present

## 2016-06-24 DIAGNOSIS — E1169 Type 2 diabetes mellitus with other specified complication: Secondary | ICD-10-CM | POA: Diagnosis not present

## 2016-06-30 DIAGNOSIS — I10 Essential (primary) hypertension: Secondary | ICD-10-CM | POA: Diagnosis not present

## 2016-06-30 DIAGNOSIS — E1165 Type 2 diabetes mellitus with hyperglycemia: Secondary | ICD-10-CM | POA: Diagnosis not present

## 2016-06-30 DIAGNOSIS — E1151 Type 2 diabetes mellitus with diabetic peripheral angiopathy without gangrene: Secondary | ICD-10-CM | POA: Diagnosis not present

## 2016-06-30 DIAGNOSIS — E11621 Type 2 diabetes mellitus with foot ulcer: Secondary | ICD-10-CM | POA: Diagnosis not present

## 2016-06-30 DIAGNOSIS — Z5181 Encounter for therapeutic drug level monitoring: Secondary | ICD-10-CM | POA: Diagnosis not present

## 2016-06-30 DIAGNOSIS — L97524 Non-pressure chronic ulcer of other part of left foot with necrosis of bone: Secondary | ICD-10-CM | POA: Diagnosis not present

## 2016-06-30 DIAGNOSIS — E1169 Type 2 diabetes mellitus with other specified complication: Secondary | ICD-10-CM | POA: Diagnosis not present

## 2016-06-30 DIAGNOSIS — L02612 Cutaneous abscess of left foot: Secondary | ICD-10-CM | POA: Diagnosis not present

## 2016-06-30 DIAGNOSIS — E1142 Type 2 diabetes mellitus with diabetic polyneuropathy: Secondary | ICD-10-CM | POA: Diagnosis not present

## 2016-06-30 DIAGNOSIS — Z452 Encounter for adjustment and management of vascular access device: Secondary | ICD-10-CM | POA: Diagnosis not present

## 2016-06-30 DIAGNOSIS — L03032 Cellulitis of left toe: Secondary | ICD-10-CM | POA: Diagnosis not present

## 2016-06-30 DIAGNOSIS — Z792 Long term (current) use of antibiotics: Secondary | ICD-10-CM | POA: Diagnosis not present

## 2016-06-30 DIAGNOSIS — M86472 Chronic osteomyelitis with draining sinus, left ankle and foot: Secondary | ICD-10-CM | POA: Diagnosis not present

## 2016-07-03 DIAGNOSIS — E11621 Type 2 diabetes mellitus with foot ulcer: Secondary | ICD-10-CM | POA: Diagnosis not present

## 2016-07-03 DIAGNOSIS — M86472 Chronic osteomyelitis with draining sinus, left ankle and foot: Secondary | ICD-10-CM | POA: Diagnosis not present

## 2016-07-03 DIAGNOSIS — E1169 Type 2 diabetes mellitus with other specified complication: Secondary | ICD-10-CM | POA: Diagnosis not present

## 2016-07-03 DIAGNOSIS — L02612 Cutaneous abscess of left foot: Secondary | ICD-10-CM | POA: Diagnosis not present

## 2016-07-03 DIAGNOSIS — E1142 Type 2 diabetes mellitus with diabetic polyneuropathy: Secondary | ICD-10-CM | POA: Diagnosis not present

## 2016-07-03 DIAGNOSIS — L03032 Cellulitis of left toe: Secondary | ICD-10-CM | POA: Diagnosis not present

## 2016-07-03 DIAGNOSIS — Z452 Encounter for adjustment and management of vascular access device: Secondary | ICD-10-CM | POA: Diagnosis not present

## 2016-07-03 DIAGNOSIS — E1165 Type 2 diabetes mellitus with hyperglycemia: Secondary | ICD-10-CM | POA: Diagnosis not present

## 2016-07-03 DIAGNOSIS — L97524 Non-pressure chronic ulcer of other part of left foot with necrosis of bone: Secondary | ICD-10-CM | POA: Diagnosis not present

## 2016-07-03 DIAGNOSIS — E1151 Type 2 diabetes mellitus with diabetic peripheral angiopathy without gangrene: Secondary | ICD-10-CM | POA: Diagnosis not present

## 2016-07-03 DIAGNOSIS — I10 Essential (primary) hypertension: Secondary | ICD-10-CM | POA: Diagnosis not present

## 2016-07-03 DIAGNOSIS — Z5181 Encounter for therapeutic drug level monitoring: Secondary | ICD-10-CM | POA: Diagnosis not present

## 2016-07-03 DIAGNOSIS — Z792 Long term (current) use of antibiotics: Secondary | ICD-10-CM | POA: Diagnosis not present

## 2016-07-08 DIAGNOSIS — E1151 Type 2 diabetes mellitus with diabetic peripheral angiopathy without gangrene: Secondary | ICD-10-CM | POA: Diagnosis not present

## 2016-07-08 DIAGNOSIS — Z452 Encounter for adjustment and management of vascular access device: Secondary | ICD-10-CM | POA: Diagnosis not present

## 2016-07-08 DIAGNOSIS — L97524 Non-pressure chronic ulcer of other part of left foot with necrosis of bone: Secondary | ICD-10-CM | POA: Diagnosis not present

## 2016-07-08 DIAGNOSIS — Z5181 Encounter for therapeutic drug level monitoring: Secondary | ICD-10-CM | POA: Diagnosis not present

## 2016-07-08 DIAGNOSIS — E1169 Type 2 diabetes mellitus with other specified complication: Secondary | ICD-10-CM | POA: Diagnosis not present

## 2016-07-08 DIAGNOSIS — M86472 Chronic osteomyelitis with draining sinus, left ankle and foot: Secondary | ICD-10-CM | POA: Diagnosis not present

## 2016-07-08 DIAGNOSIS — E1165 Type 2 diabetes mellitus with hyperglycemia: Secondary | ICD-10-CM | POA: Diagnosis not present

## 2016-07-08 DIAGNOSIS — E11621 Type 2 diabetes mellitus with foot ulcer: Secondary | ICD-10-CM | POA: Diagnosis not present

## 2016-07-08 DIAGNOSIS — Z792 Long term (current) use of antibiotics: Secondary | ICD-10-CM | POA: Diagnosis not present

## 2016-07-08 DIAGNOSIS — I10 Essential (primary) hypertension: Secondary | ICD-10-CM | POA: Diagnosis not present

## 2016-07-08 DIAGNOSIS — L02612 Cutaneous abscess of left foot: Secondary | ICD-10-CM | POA: Diagnosis not present

## 2016-07-08 DIAGNOSIS — L03032 Cellulitis of left toe: Secondary | ICD-10-CM | POA: Diagnosis not present

## 2016-07-08 DIAGNOSIS — E1142 Type 2 diabetes mellitus with diabetic polyneuropathy: Secondary | ICD-10-CM | POA: Diagnosis not present

## 2016-07-10 DIAGNOSIS — Z452 Encounter for adjustment and management of vascular access device: Secondary | ICD-10-CM | POA: Diagnosis not present

## 2016-07-10 DIAGNOSIS — M86472 Chronic osteomyelitis with draining sinus, left ankle and foot: Secondary | ICD-10-CM | POA: Diagnosis not present

## 2016-07-10 DIAGNOSIS — E1142 Type 2 diabetes mellitus with diabetic polyneuropathy: Secondary | ICD-10-CM | POA: Diagnosis not present

## 2016-07-10 DIAGNOSIS — Z5181 Encounter for therapeutic drug level monitoring: Secondary | ICD-10-CM | POA: Diagnosis not present

## 2016-07-10 DIAGNOSIS — E1151 Type 2 diabetes mellitus with diabetic peripheral angiopathy without gangrene: Secondary | ICD-10-CM | POA: Diagnosis not present

## 2016-07-10 DIAGNOSIS — L97524 Non-pressure chronic ulcer of other part of left foot with necrosis of bone: Secondary | ICD-10-CM | POA: Diagnosis not present

## 2016-07-10 DIAGNOSIS — L03032 Cellulitis of left toe: Secondary | ICD-10-CM | POA: Diagnosis not present

## 2016-07-10 DIAGNOSIS — E1169 Type 2 diabetes mellitus with other specified complication: Secondary | ICD-10-CM | POA: Diagnosis not present

## 2016-07-10 DIAGNOSIS — Z792 Long term (current) use of antibiotics: Secondary | ICD-10-CM | POA: Diagnosis not present

## 2016-07-10 DIAGNOSIS — E11621 Type 2 diabetes mellitus with foot ulcer: Secondary | ICD-10-CM | POA: Diagnosis not present

## 2016-07-10 DIAGNOSIS — I10 Essential (primary) hypertension: Secondary | ICD-10-CM | POA: Diagnosis not present

## 2016-07-10 DIAGNOSIS — L02612 Cutaneous abscess of left foot: Secondary | ICD-10-CM | POA: Diagnosis not present

## 2016-07-10 DIAGNOSIS — E1165 Type 2 diabetes mellitus with hyperglycemia: Secondary | ICD-10-CM | POA: Diagnosis not present

## 2016-07-12 DIAGNOSIS — E119 Type 2 diabetes mellitus without complications: Secondary | ICD-10-CM | POA: Diagnosis not present

## 2016-07-12 DIAGNOSIS — Z79899 Other long term (current) drug therapy: Secondary | ICD-10-CM | POA: Diagnosis not present

## 2016-07-12 DIAGNOSIS — I10 Essential (primary) hypertension: Secondary | ICD-10-CM | POA: Diagnosis not present

## 2016-07-12 DIAGNOSIS — R0602 Shortness of breath: Secondary | ICD-10-CM | POA: Diagnosis not present

## 2016-07-12 DIAGNOSIS — Z7901 Long term (current) use of anticoagulants: Secondary | ICD-10-CM | POA: Diagnosis not present

## 2016-07-12 DIAGNOSIS — Z794 Long term (current) use of insulin: Secondary | ICD-10-CM | POA: Diagnosis not present

## 2016-07-12 DIAGNOSIS — Z8719 Personal history of other diseases of the digestive system: Secondary | ICD-10-CM | POA: Diagnosis not present

## 2016-07-12 DIAGNOSIS — G8929 Other chronic pain: Secondary | ICD-10-CM | POA: Diagnosis not present

## 2016-07-12 DIAGNOSIS — R05 Cough: Secondary | ICD-10-CM | POA: Diagnosis not present

## 2016-07-12 DIAGNOSIS — Z7984 Long term (current) use of oral hypoglycemic drugs: Secondary | ICD-10-CM | POA: Diagnosis not present

## 2016-07-12 DIAGNOSIS — R079 Chest pain, unspecified: Secondary | ICD-10-CM | POA: Diagnosis not present

## 2016-07-12 DIAGNOSIS — M549 Dorsalgia, unspecified: Secondary | ICD-10-CM | POA: Diagnosis not present

## 2016-07-12 DIAGNOSIS — R0789 Other chest pain: Secondary | ICD-10-CM | POA: Diagnosis not present

## 2016-07-12 DIAGNOSIS — I2699 Other pulmonary embolism without acute cor pulmonale: Secondary | ICD-10-CM | POA: Diagnosis not present

## 2016-07-13 DIAGNOSIS — R0602 Shortness of breath: Secondary | ICD-10-CM | POA: Diagnosis not present

## 2016-07-13 DIAGNOSIS — I2699 Other pulmonary embolism without acute cor pulmonale: Secondary | ICD-10-CM | POA: Diagnosis not present

## 2016-07-13 DIAGNOSIS — Z7901 Long term (current) use of anticoagulants: Secondary | ICD-10-CM | POA: Diagnosis not present

## 2016-07-13 DIAGNOSIS — Z794 Long term (current) use of insulin: Secondary | ICD-10-CM | POA: Diagnosis not present

## 2016-07-13 DIAGNOSIS — Z89411 Acquired absence of right great toe: Secondary | ICD-10-CM | POA: Diagnosis not present

## 2016-07-13 DIAGNOSIS — I200 Unstable angina: Secondary | ICD-10-CM | POA: Diagnosis not present

## 2016-07-13 DIAGNOSIS — I10 Essential (primary) hypertension: Secondary | ICD-10-CM | POA: Diagnosis not present

## 2016-07-13 DIAGNOSIS — R079 Chest pain, unspecified: Secondary | ICD-10-CM | POA: Diagnosis not present

## 2016-07-13 DIAGNOSIS — R0781 Pleurodynia: Secondary | ICD-10-CM | POA: Diagnosis not present

## 2016-07-13 DIAGNOSIS — Z79899 Other long term (current) drug therapy: Secondary | ICD-10-CM | POA: Diagnosis not present

## 2016-07-13 DIAGNOSIS — R109 Unspecified abdominal pain: Secondary | ICD-10-CM | POA: Diagnosis not present

## 2016-07-13 DIAGNOSIS — E785 Hyperlipidemia, unspecified: Secondary | ICD-10-CM | POA: Diagnosis not present

## 2016-07-13 DIAGNOSIS — E119 Type 2 diabetes mellitus without complications: Secondary | ICD-10-CM | POA: Diagnosis not present

## 2016-07-13 DIAGNOSIS — Z89412 Acquired absence of left great toe: Secondary | ICD-10-CM | POA: Diagnosis not present

## 2016-07-13 DIAGNOSIS — I739 Peripheral vascular disease, unspecified: Secondary | ICD-10-CM | POA: Diagnosis not present

## 2016-07-14 DIAGNOSIS — I2510 Atherosclerotic heart disease of native coronary artery without angina pectoris: Secondary | ICD-10-CM | POA: Diagnosis not present

## 2016-07-14 DIAGNOSIS — R0781 Pleurodynia: Secondary | ICD-10-CM | POA: Diagnosis not present

## 2016-07-14 DIAGNOSIS — Z8249 Family history of ischemic heart disease and other diseases of the circulatory system: Secondary | ICD-10-CM | POA: Diagnosis not present

## 2016-07-14 DIAGNOSIS — R079 Chest pain, unspecified: Secondary | ICD-10-CM | POA: Diagnosis not present

## 2016-07-14 DIAGNOSIS — I2699 Other pulmonary embolism without acute cor pulmonale: Secondary | ICD-10-CM | POA: Diagnosis not present

## 2016-07-16 DIAGNOSIS — I2699 Other pulmonary embolism without acute cor pulmonale: Secondary | ICD-10-CM | POA: Diagnosis not present

## 2016-07-16 DIAGNOSIS — L97523 Non-pressure chronic ulcer of other part of left foot with necrosis of muscle: Secondary | ICD-10-CM | POA: Diagnosis not present

## 2016-07-16 DIAGNOSIS — Z79899 Other long term (current) drug therapy: Secondary | ICD-10-CM | POA: Diagnosis not present

## 2016-07-16 DIAGNOSIS — E1165 Type 2 diabetes mellitus with hyperglycemia: Secondary | ICD-10-CM | POA: Diagnosis not present

## 2016-07-16 DIAGNOSIS — E1142 Type 2 diabetes mellitus with diabetic polyneuropathy: Secondary | ICD-10-CM | POA: Diagnosis not present

## 2016-07-16 DIAGNOSIS — Z7901 Long term (current) use of anticoagulants: Secondary | ICD-10-CM | POA: Diagnosis not present

## 2016-07-17 DIAGNOSIS — E11621 Type 2 diabetes mellitus with foot ulcer: Secondary | ICD-10-CM | POA: Diagnosis not present

## 2016-07-17 DIAGNOSIS — Z452 Encounter for adjustment and management of vascular access device: Secondary | ICD-10-CM | POA: Diagnosis not present

## 2016-07-17 DIAGNOSIS — M86472 Chronic osteomyelitis with draining sinus, left ankle and foot: Secondary | ICD-10-CM | POA: Diagnosis not present

## 2016-07-17 DIAGNOSIS — Z792 Long term (current) use of antibiotics: Secondary | ICD-10-CM | POA: Diagnosis not present

## 2016-07-17 DIAGNOSIS — E1151 Type 2 diabetes mellitus with diabetic peripheral angiopathy without gangrene: Secondary | ICD-10-CM | POA: Diagnosis not present

## 2016-07-17 DIAGNOSIS — E1165 Type 2 diabetes mellitus with hyperglycemia: Secondary | ICD-10-CM | POA: Diagnosis not present

## 2016-07-17 DIAGNOSIS — L97524 Non-pressure chronic ulcer of other part of left foot with necrosis of bone: Secondary | ICD-10-CM | POA: Diagnosis not present

## 2016-07-17 DIAGNOSIS — E1142 Type 2 diabetes mellitus with diabetic polyneuropathy: Secondary | ICD-10-CM | POA: Diagnosis not present

## 2016-07-17 DIAGNOSIS — Z5181 Encounter for therapeutic drug level monitoring: Secondary | ICD-10-CM | POA: Diagnosis not present

## 2016-07-17 DIAGNOSIS — I10 Essential (primary) hypertension: Secondary | ICD-10-CM | POA: Diagnosis not present

## 2016-07-17 DIAGNOSIS — L03032 Cellulitis of left toe: Secondary | ICD-10-CM | POA: Diagnosis not present

## 2016-07-17 DIAGNOSIS — L02612 Cutaneous abscess of left foot: Secondary | ICD-10-CM | POA: Diagnosis not present

## 2016-07-17 DIAGNOSIS — E1169 Type 2 diabetes mellitus with other specified complication: Secondary | ICD-10-CM | POA: Diagnosis not present

## 2016-07-18 DIAGNOSIS — E11621 Type 2 diabetes mellitus with foot ulcer: Secondary | ICD-10-CM | POA: Diagnosis not present

## 2016-07-18 DIAGNOSIS — E1169 Type 2 diabetes mellitus with other specified complication: Secondary | ICD-10-CM | POA: Diagnosis not present

## 2016-07-18 DIAGNOSIS — L03032 Cellulitis of left toe: Secondary | ICD-10-CM | POA: Diagnosis not present

## 2016-07-18 DIAGNOSIS — Z5181 Encounter for therapeutic drug level monitoring: Secondary | ICD-10-CM | POA: Diagnosis not present

## 2016-07-18 DIAGNOSIS — M86472 Chronic osteomyelitis with draining sinus, left ankle and foot: Secondary | ICD-10-CM | POA: Diagnosis not present

## 2016-07-18 DIAGNOSIS — E1151 Type 2 diabetes mellitus with diabetic peripheral angiopathy without gangrene: Secondary | ICD-10-CM | POA: Diagnosis not present

## 2016-07-18 DIAGNOSIS — E1142 Type 2 diabetes mellitus with diabetic polyneuropathy: Secondary | ICD-10-CM | POA: Diagnosis not present

## 2016-07-18 DIAGNOSIS — E1165 Type 2 diabetes mellitus with hyperglycemia: Secondary | ICD-10-CM | POA: Diagnosis not present

## 2016-07-18 DIAGNOSIS — L97524 Non-pressure chronic ulcer of other part of left foot with necrosis of bone: Secondary | ICD-10-CM | POA: Diagnosis not present

## 2016-07-18 DIAGNOSIS — I10 Essential (primary) hypertension: Secondary | ICD-10-CM | POA: Diagnosis not present

## 2016-07-18 DIAGNOSIS — Z792 Long term (current) use of antibiotics: Secondary | ICD-10-CM | POA: Diagnosis not present

## 2016-07-18 DIAGNOSIS — Z452 Encounter for adjustment and management of vascular access device: Secondary | ICD-10-CM | POA: Diagnosis not present

## 2016-07-18 DIAGNOSIS — L02612 Cutaneous abscess of left foot: Secondary | ICD-10-CM | POA: Diagnosis not present

## 2016-07-21 DIAGNOSIS — L97524 Non-pressure chronic ulcer of other part of left foot with necrosis of bone: Secondary | ICD-10-CM | POA: Diagnosis not present

## 2016-07-21 DIAGNOSIS — Z5181 Encounter for therapeutic drug level monitoring: Secondary | ICD-10-CM | POA: Diagnosis not present

## 2016-07-21 DIAGNOSIS — E1165 Type 2 diabetes mellitus with hyperglycemia: Secondary | ICD-10-CM | POA: Diagnosis not present

## 2016-07-21 DIAGNOSIS — Z452 Encounter for adjustment and management of vascular access device: Secondary | ICD-10-CM | POA: Diagnosis not present

## 2016-07-21 DIAGNOSIS — Z792 Long term (current) use of antibiotics: Secondary | ICD-10-CM | POA: Diagnosis not present

## 2016-07-21 DIAGNOSIS — L02612 Cutaneous abscess of left foot: Secondary | ICD-10-CM | POA: Diagnosis not present

## 2016-07-21 DIAGNOSIS — E11621 Type 2 diabetes mellitus with foot ulcer: Secondary | ICD-10-CM | POA: Diagnosis not present

## 2016-07-21 DIAGNOSIS — E1151 Type 2 diabetes mellitus with diabetic peripheral angiopathy without gangrene: Secondary | ICD-10-CM | POA: Diagnosis not present

## 2016-07-21 DIAGNOSIS — E1142 Type 2 diabetes mellitus with diabetic polyneuropathy: Secondary | ICD-10-CM | POA: Diagnosis not present

## 2016-07-21 DIAGNOSIS — E1169 Type 2 diabetes mellitus with other specified complication: Secondary | ICD-10-CM | POA: Diagnosis not present

## 2016-07-21 DIAGNOSIS — M86472 Chronic osteomyelitis with draining sinus, left ankle and foot: Secondary | ICD-10-CM | POA: Diagnosis not present

## 2016-07-21 DIAGNOSIS — L03032 Cellulitis of left toe: Secondary | ICD-10-CM | POA: Diagnosis not present

## 2016-07-21 DIAGNOSIS — I10 Essential (primary) hypertension: Secondary | ICD-10-CM | POA: Diagnosis not present

## 2016-07-23 DIAGNOSIS — E1169 Type 2 diabetes mellitus with other specified complication: Secondary | ICD-10-CM | POA: Diagnosis not present

## 2016-07-23 DIAGNOSIS — E11621 Type 2 diabetes mellitus with foot ulcer: Secondary | ICD-10-CM | POA: Diagnosis not present

## 2016-07-23 DIAGNOSIS — L97524 Non-pressure chronic ulcer of other part of left foot with necrosis of bone: Secondary | ICD-10-CM | POA: Diagnosis not present

## 2016-07-23 DIAGNOSIS — Z792 Long term (current) use of antibiotics: Secondary | ICD-10-CM | POA: Diagnosis not present

## 2016-07-23 DIAGNOSIS — L02612 Cutaneous abscess of left foot: Secondary | ICD-10-CM | POA: Diagnosis not present

## 2016-07-23 DIAGNOSIS — Z5181 Encounter for therapeutic drug level monitoring: Secondary | ICD-10-CM | POA: Diagnosis not present

## 2016-07-23 DIAGNOSIS — E1151 Type 2 diabetes mellitus with diabetic peripheral angiopathy without gangrene: Secondary | ICD-10-CM | POA: Diagnosis not present

## 2016-07-23 DIAGNOSIS — M86472 Chronic osteomyelitis with draining sinus, left ankle and foot: Secondary | ICD-10-CM | POA: Diagnosis not present

## 2016-07-23 DIAGNOSIS — I10 Essential (primary) hypertension: Secondary | ICD-10-CM | POA: Diagnosis not present

## 2016-07-23 DIAGNOSIS — E1142 Type 2 diabetes mellitus with diabetic polyneuropathy: Secondary | ICD-10-CM | POA: Diagnosis not present

## 2016-07-23 DIAGNOSIS — L03032 Cellulitis of left toe: Secondary | ICD-10-CM | POA: Diagnosis not present

## 2016-07-23 DIAGNOSIS — Z452 Encounter for adjustment and management of vascular access device: Secondary | ICD-10-CM | POA: Diagnosis not present

## 2016-07-23 DIAGNOSIS — E1165 Type 2 diabetes mellitus with hyperglycemia: Secondary | ICD-10-CM | POA: Diagnosis not present

## 2016-07-28 DIAGNOSIS — E11621 Type 2 diabetes mellitus with foot ulcer: Secondary | ICD-10-CM | POA: Diagnosis not present

## 2016-07-28 DIAGNOSIS — E1142 Type 2 diabetes mellitus with diabetic polyneuropathy: Secondary | ICD-10-CM | POA: Diagnosis not present

## 2016-07-28 DIAGNOSIS — L02612 Cutaneous abscess of left foot: Secondary | ICD-10-CM | POA: Diagnosis not present

## 2016-07-28 DIAGNOSIS — E1151 Type 2 diabetes mellitus with diabetic peripheral angiopathy without gangrene: Secondary | ICD-10-CM | POA: Diagnosis not present

## 2016-07-28 DIAGNOSIS — L97524 Non-pressure chronic ulcer of other part of left foot with necrosis of bone: Secondary | ICD-10-CM | POA: Diagnosis not present

## 2016-07-28 DIAGNOSIS — Z452 Encounter for adjustment and management of vascular access device: Secondary | ICD-10-CM | POA: Diagnosis not present

## 2016-07-28 DIAGNOSIS — I10 Essential (primary) hypertension: Secondary | ICD-10-CM | POA: Diagnosis not present

## 2016-07-28 DIAGNOSIS — E1169 Type 2 diabetes mellitus with other specified complication: Secondary | ICD-10-CM | POA: Diagnosis not present

## 2016-07-28 DIAGNOSIS — E1165 Type 2 diabetes mellitus with hyperglycemia: Secondary | ICD-10-CM | POA: Diagnosis not present

## 2016-07-28 DIAGNOSIS — Z5181 Encounter for therapeutic drug level monitoring: Secondary | ICD-10-CM | POA: Diagnosis not present

## 2016-07-28 DIAGNOSIS — Z792 Long term (current) use of antibiotics: Secondary | ICD-10-CM | POA: Diagnosis not present

## 2016-07-28 DIAGNOSIS — L03032 Cellulitis of left toe: Secondary | ICD-10-CM | POA: Diagnosis not present

## 2016-07-28 DIAGNOSIS — M86472 Chronic osteomyelitis with draining sinus, left ankle and foot: Secondary | ICD-10-CM | POA: Diagnosis not present

## 2016-07-31 DIAGNOSIS — E1165 Type 2 diabetes mellitus with hyperglycemia: Secondary | ICD-10-CM | POA: Diagnosis not present

## 2016-07-31 DIAGNOSIS — E1151 Type 2 diabetes mellitus with diabetic peripheral angiopathy without gangrene: Secondary | ICD-10-CM | POA: Diagnosis not present

## 2016-07-31 DIAGNOSIS — Z792 Long term (current) use of antibiotics: Secondary | ICD-10-CM | POA: Diagnosis not present

## 2016-07-31 DIAGNOSIS — E1142 Type 2 diabetes mellitus with diabetic polyneuropathy: Secondary | ICD-10-CM | POA: Diagnosis not present

## 2016-07-31 DIAGNOSIS — M86472 Chronic osteomyelitis with draining sinus, left ankle and foot: Secondary | ICD-10-CM | POA: Diagnosis not present

## 2016-07-31 DIAGNOSIS — L02612 Cutaneous abscess of left foot: Secondary | ICD-10-CM | POA: Diagnosis not present

## 2016-07-31 DIAGNOSIS — I10 Essential (primary) hypertension: Secondary | ICD-10-CM | POA: Diagnosis not present

## 2016-07-31 DIAGNOSIS — E1169 Type 2 diabetes mellitus with other specified complication: Secondary | ICD-10-CM | POA: Diagnosis not present

## 2016-07-31 DIAGNOSIS — Z5181 Encounter for therapeutic drug level monitoring: Secondary | ICD-10-CM | POA: Diagnosis not present

## 2016-07-31 DIAGNOSIS — L03032 Cellulitis of left toe: Secondary | ICD-10-CM | POA: Diagnosis not present

## 2016-07-31 DIAGNOSIS — E11621 Type 2 diabetes mellitus with foot ulcer: Secondary | ICD-10-CM | POA: Diagnosis not present

## 2016-07-31 DIAGNOSIS — L97524 Non-pressure chronic ulcer of other part of left foot with necrosis of bone: Secondary | ICD-10-CM | POA: Diagnosis not present

## 2016-07-31 DIAGNOSIS — Z452 Encounter for adjustment and management of vascular access device: Secondary | ICD-10-CM | POA: Diagnosis not present

## 2016-08-04 DIAGNOSIS — M86472 Chronic osteomyelitis with draining sinus, left ankle and foot: Secondary | ICD-10-CM | POA: Diagnosis not present

## 2016-08-04 DIAGNOSIS — L97524 Non-pressure chronic ulcer of other part of left foot with necrosis of bone: Secondary | ICD-10-CM | POA: Diagnosis not present

## 2016-08-04 DIAGNOSIS — I10 Essential (primary) hypertension: Secondary | ICD-10-CM | POA: Diagnosis not present

## 2016-08-04 DIAGNOSIS — E11621 Type 2 diabetes mellitus with foot ulcer: Secondary | ICD-10-CM | POA: Diagnosis not present

## 2016-08-04 DIAGNOSIS — E1142 Type 2 diabetes mellitus with diabetic polyneuropathy: Secondary | ICD-10-CM | POA: Diagnosis not present

## 2016-08-04 DIAGNOSIS — Z5181 Encounter for therapeutic drug level monitoring: Secondary | ICD-10-CM | POA: Diagnosis not present

## 2016-08-04 DIAGNOSIS — L03032 Cellulitis of left toe: Secondary | ICD-10-CM | POA: Diagnosis not present

## 2016-08-04 DIAGNOSIS — E1169 Type 2 diabetes mellitus with other specified complication: Secondary | ICD-10-CM | POA: Diagnosis not present

## 2016-08-04 DIAGNOSIS — Z452 Encounter for adjustment and management of vascular access device: Secondary | ICD-10-CM | POA: Diagnosis not present

## 2016-08-04 DIAGNOSIS — Z792 Long term (current) use of antibiotics: Secondary | ICD-10-CM | POA: Diagnosis not present

## 2016-08-04 DIAGNOSIS — E1165 Type 2 diabetes mellitus with hyperglycemia: Secondary | ICD-10-CM | POA: Diagnosis not present

## 2016-08-04 DIAGNOSIS — E1151 Type 2 diabetes mellitus with diabetic peripheral angiopathy without gangrene: Secondary | ICD-10-CM | POA: Diagnosis not present

## 2016-08-04 DIAGNOSIS — L02612 Cutaneous abscess of left foot: Secondary | ICD-10-CM | POA: Diagnosis not present

## 2016-08-07 DIAGNOSIS — E11621 Type 2 diabetes mellitus with foot ulcer: Secondary | ICD-10-CM | POA: Diagnosis not present

## 2016-08-07 DIAGNOSIS — M86472 Chronic osteomyelitis with draining sinus, left ankle and foot: Secondary | ICD-10-CM | POA: Diagnosis not present

## 2016-08-07 DIAGNOSIS — Z452 Encounter for adjustment and management of vascular access device: Secondary | ICD-10-CM | POA: Diagnosis not present

## 2016-08-07 DIAGNOSIS — L97524 Non-pressure chronic ulcer of other part of left foot with necrosis of bone: Secondary | ICD-10-CM | POA: Diagnosis not present

## 2016-08-07 DIAGNOSIS — Z792 Long term (current) use of antibiotics: Secondary | ICD-10-CM | POA: Diagnosis not present

## 2016-08-07 DIAGNOSIS — L03032 Cellulitis of left toe: Secondary | ICD-10-CM | POA: Diagnosis not present

## 2016-08-07 DIAGNOSIS — E1151 Type 2 diabetes mellitus with diabetic peripheral angiopathy without gangrene: Secondary | ICD-10-CM | POA: Diagnosis not present

## 2016-08-07 DIAGNOSIS — E1169 Type 2 diabetes mellitus with other specified complication: Secondary | ICD-10-CM | POA: Diagnosis not present

## 2016-08-07 DIAGNOSIS — L02612 Cutaneous abscess of left foot: Secondary | ICD-10-CM | POA: Diagnosis not present

## 2016-08-07 DIAGNOSIS — E1142 Type 2 diabetes mellitus with diabetic polyneuropathy: Secondary | ICD-10-CM | POA: Diagnosis not present

## 2016-08-07 DIAGNOSIS — E1165 Type 2 diabetes mellitus with hyperglycemia: Secondary | ICD-10-CM | POA: Diagnosis not present

## 2016-08-07 DIAGNOSIS — Z5181 Encounter for therapeutic drug level monitoring: Secondary | ICD-10-CM | POA: Diagnosis not present

## 2016-08-07 DIAGNOSIS — I10 Essential (primary) hypertension: Secondary | ICD-10-CM | POA: Diagnosis not present

## 2016-08-11 DIAGNOSIS — L97523 Non-pressure chronic ulcer of other part of left foot with necrosis of muscle: Secondary | ICD-10-CM | POA: Diagnosis not present

## 2016-08-11 DIAGNOSIS — E1142 Type 2 diabetes mellitus with diabetic polyneuropathy: Secondary | ICD-10-CM | POA: Diagnosis not present

## 2016-08-12 DIAGNOSIS — E11621 Type 2 diabetes mellitus with foot ulcer: Secondary | ICD-10-CM | POA: Diagnosis not present

## 2016-08-12 DIAGNOSIS — E1169 Type 2 diabetes mellitus with other specified complication: Secondary | ICD-10-CM | POA: Diagnosis not present

## 2016-08-12 DIAGNOSIS — L03032 Cellulitis of left toe: Secondary | ICD-10-CM | POA: Diagnosis not present

## 2016-08-12 DIAGNOSIS — I10 Essential (primary) hypertension: Secondary | ICD-10-CM | POA: Diagnosis not present

## 2016-08-12 DIAGNOSIS — L02612 Cutaneous abscess of left foot: Secondary | ICD-10-CM | POA: Diagnosis not present

## 2016-08-12 DIAGNOSIS — E1142 Type 2 diabetes mellitus with diabetic polyneuropathy: Secondary | ICD-10-CM | POA: Diagnosis not present

## 2016-08-12 DIAGNOSIS — E1165 Type 2 diabetes mellitus with hyperglycemia: Secondary | ICD-10-CM | POA: Diagnosis not present

## 2016-08-12 DIAGNOSIS — Z452 Encounter for adjustment and management of vascular access device: Secondary | ICD-10-CM | POA: Diagnosis not present

## 2016-08-12 DIAGNOSIS — M86472 Chronic osteomyelitis with draining sinus, left ankle and foot: Secondary | ICD-10-CM | POA: Diagnosis not present

## 2016-08-12 DIAGNOSIS — Z792 Long term (current) use of antibiotics: Secondary | ICD-10-CM | POA: Diagnosis not present

## 2016-08-12 DIAGNOSIS — L97524 Non-pressure chronic ulcer of other part of left foot with necrosis of bone: Secondary | ICD-10-CM | POA: Diagnosis not present

## 2016-08-12 DIAGNOSIS — Z5181 Encounter for therapeutic drug level monitoring: Secondary | ICD-10-CM | POA: Diagnosis not present

## 2016-08-12 DIAGNOSIS — E1151 Type 2 diabetes mellitus with diabetic peripheral angiopathy without gangrene: Secondary | ICD-10-CM | POA: Diagnosis not present

## 2016-08-13 DIAGNOSIS — E785 Hyperlipidemia, unspecified: Secondary | ICD-10-CM | POA: Diagnosis not present

## 2016-08-13 DIAGNOSIS — I10 Essential (primary) hypertension: Secondary | ICD-10-CM | POA: Diagnosis not present

## 2016-08-13 DIAGNOSIS — M25511 Pain in right shoulder: Secondary | ICD-10-CM | POA: Diagnosis not present

## 2016-08-13 DIAGNOSIS — E1165 Type 2 diabetes mellitus with hyperglycemia: Secondary | ICD-10-CM | POA: Diagnosis not present

## 2016-08-13 DIAGNOSIS — Z79899 Other long term (current) drug therapy: Secondary | ICD-10-CM | POA: Diagnosis not present

## 2016-08-21 DIAGNOSIS — M546 Pain in thoracic spine: Secondary | ICD-10-CM | POA: Diagnosis not present

## 2016-08-21 DIAGNOSIS — E1165 Type 2 diabetes mellitus with hyperglycemia: Secondary | ICD-10-CM | POA: Diagnosis not present

## 2016-10-30 DIAGNOSIS — M549 Dorsalgia, unspecified: Secondary | ICD-10-CM | POA: Diagnosis not present

## 2016-10-30 DIAGNOSIS — R238 Other skin changes: Secondary | ICD-10-CM | POA: Diagnosis not present

## 2016-10-30 DIAGNOSIS — E1165 Type 2 diabetes mellitus with hyperglycemia: Secondary | ICD-10-CM | POA: Diagnosis not present

## 2016-11-06 DIAGNOSIS — Z86711 Personal history of pulmonary embolism: Secondary | ICD-10-CM | POA: Diagnosis not present

## 2016-11-06 DIAGNOSIS — Z794 Long term (current) use of insulin: Secondary | ICD-10-CM | POA: Diagnosis not present

## 2016-11-06 DIAGNOSIS — R079 Chest pain, unspecified: Secondary | ICD-10-CM | POA: Diagnosis not present

## 2016-11-06 DIAGNOSIS — E119 Type 2 diabetes mellitus without complications: Secondary | ICD-10-CM | POA: Diagnosis not present

## 2016-11-06 DIAGNOSIS — Z89411 Acquired absence of right great toe: Secondary | ICD-10-CM | POA: Diagnosis not present

## 2016-11-06 DIAGNOSIS — M79672 Pain in left foot: Secondary | ICD-10-CM | POA: Diagnosis not present

## 2016-11-06 DIAGNOSIS — R0600 Dyspnea, unspecified: Secondary | ICD-10-CM | POA: Diagnosis not present

## 2016-11-06 DIAGNOSIS — M7989 Other specified soft tissue disorders: Secondary | ICD-10-CM | POA: Diagnosis not present

## 2016-11-06 DIAGNOSIS — Z7901 Long term (current) use of anticoagulants: Secondary | ICD-10-CM | POA: Diagnosis not present

## 2016-11-06 DIAGNOSIS — Z89412 Acquired absence of left great toe: Secondary | ICD-10-CM | POA: Diagnosis not present

## 2016-11-06 DIAGNOSIS — I10 Essential (primary) hypertension: Secondary | ICD-10-CM | POA: Diagnosis not present

## 2016-11-06 DIAGNOSIS — E7800 Pure hypercholesterolemia, unspecified: Secondary | ICD-10-CM | POA: Diagnosis not present

## 2016-11-07 DIAGNOSIS — R0600 Dyspnea, unspecified: Secondary | ICD-10-CM | POA: Diagnosis not present

## 2016-11-07 DIAGNOSIS — M7989 Other specified soft tissue disorders: Secondary | ICD-10-CM | POA: Diagnosis not present

## 2016-11-07 DIAGNOSIS — R079 Chest pain, unspecified: Secondary | ICD-10-CM | POA: Diagnosis not present

## 2016-11-19 DIAGNOSIS — E1142 Type 2 diabetes mellitus with diabetic polyneuropathy: Secondary | ICD-10-CM | POA: Diagnosis not present

## 2016-11-19 DIAGNOSIS — L03032 Cellulitis of left toe: Secondary | ICD-10-CM

## 2016-11-19 DIAGNOSIS — M79672 Pain in left foot: Secondary | ICD-10-CM | POA: Diagnosis not present

## 2016-11-19 HISTORY — DX: Cellulitis of left toe: L03.032

## 2016-11-21 DIAGNOSIS — E1165 Type 2 diabetes mellitus with hyperglycemia: Secondary | ICD-10-CM | POA: Diagnosis not present

## 2016-11-21 DIAGNOSIS — M79673 Pain in unspecified foot: Secondary | ICD-10-CM | POA: Diagnosis not present

## 2016-11-21 DIAGNOSIS — G629 Polyneuropathy, unspecified: Secondary | ICD-10-CM | POA: Diagnosis not present

## 2016-12-03 DIAGNOSIS — E1165 Type 2 diabetes mellitus with hyperglycemia: Secondary | ICD-10-CM | POA: Diagnosis not present

## 2016-12-03 DIAGNOSIS — E1151 Type 2 diabetes mellitus with diabetic peripheral angiopathy without gangrene: Secondary | ICD-10-CM | POA: Diagnosis not present

## 2016-12-03 DIAGNOSIS — E785 Hyperlipidemia, unspecified: Secondary | ICD-10-CM | POA: Diagnosis not present

## 2016-12-03 DIAGNOSIS — E0842 Diabetes mellitus due to underlying condition with diabetic polyneuropathy: Secondary | ICD-10-CM | POA: Diagnosis not present

## 2016-12-03 DIAGNOSIS — E1140 Type 2 diabetes mellitus with diabetic neuropathy, unspecified: Secondary | ICD-10-CM | POA: Diagnosis not present

## 2016-12-10 DIAGNOSIS — G894 Chronic pain syndrome: Secondary | ICD-10-CM | POA: Diagnosis not present

## 2016-12-10 DIAGNOSIS — M5116 Intervertebral disc disorders with radiculopathy, lumbar region: Secondary | ICD-10-CM | POA: Diagnosis not present

## 2016-12-10 DIAGNOSIS — M5126 Other intervertebral disc displacement, lumbar region: Secondary | ICD-10-CM

## 2016-12-10 DIAGNOSIS — M48061 Spinal stenosis, lumbar region without neurogenic claudication: Secondary | ICD-10-CM | POA: Diagnosis not present

## 2016-12-10 DIAGNOSIS — M4800 Spinal stenosis, site unspecified: Secondary | ICD-10-CM | POA: Diagnosis not present

## 2016-12-10 DIAGNOSIS — I10 Essential (primary) hypertension: Secondary | ICD-10-CM | POA: Diagnosis not present

## 2016-12-10 DIAGNOSIS — Z87891 Personal history of nicotine dependence: Secondary | ICD-10-CM | POA: Diagnosis not present

## 2016-12-10 DIAGNOSIS — M5416 Radiculopathy, lumbar region: Secondary | ICD-10-CM

## 2016-12-10 DIAGNOSIS — E119 Type 2 diabetes mellitus without complications: Secondary | ICD-10-CM | POA: Diagnosis not present

## 2016-12-10 HISTORY — DX: Radiculopathy, lumbar region: M54.16

## 2016-12-10 HISTORY — DX: Other intervertebral disc displacement, lumbar region: M51.26

## 2016-12-22 DIAGNOSIS — M1712 Unilateral primary osteoarthritis, left knee: Secondary | ICD-10-CM | POA: Diagnosis not present

## 2016-12-23 DIAGNOSIS — E1151 Type 2 diabetes mellitus with diabetic peripheral angiopathy without gangrene: Secondary | ICD-10-CM | POA: Diagnosis not present

## 2016-12-23 DIAGNOSIS — E0842 Diabetes mellitus due to underlying condition with diabetic polyneuropathy: Secondary | ICD-10-CM | POA: Diagnosis not present

## 2016-12-23 DIAGNOSIS — I10 Essential (primary) hypertension: Secondary | ICD-10-CM | POA: Diagnosis not present

## 2016-12-23 DIAGNOSIS — J329 Chronic sinusitis, unspecified: Secondary | ICD-10-CM | POA: Diagnosis not present

## 2016-12-29 DIAGNOSIS — M159 Polyosteoarthritis, unspecified: Secondary | ICD-10-CM | POA: Diagnosis not present

## 2016-12-29 DIAGNOSIS — G8929 Other chronic pain: Secondary | ICD-10-CM | POA: Diagnosis not present

## 2016-12-29 DIAGNOSIS — M2550 Pain in unspecified joint: Secondary | ICD-10-CM | POA: Diagnosis not present

## 2016-12-29 DIAGNOSIS — M479 Spondylosis, unspecified: Secondary | ICD-10-CM | POA: Diagnosis not present

## 2016-12-29 DIAGNOSIS — M549 Dorsalgia, unspecified: Secondary | ICD-10-CM | POA: Diagnosis not present

## 2017-01-09 DIAGNOSIS — M25511 Pain in right shoulder: Secondary | ICD-10-CM | POA: Diagnosis not present

## 2017-01-15 DIAGNOSIS — M25511 Pain in right shoulder: Secondary | ICD-10-CM | POA: Diagnosis not present

## 2017-01-22 DIAGNOSIS — Z1389 Encounter for screening for other disorder: Secondary | ICD-10-CM | POA: Diagnosis not present

## 2017-01-22 DIAGNOSIS — M159 Polyosteoarthritis, unspecified: Secondary | ICD-10-CM | POA: Diagnosis not present

## 2017-01-26 DIAGNOSIS — Z89422 Acquired absence of other left toe(s): Secondary | ICD-10-CM | POA: Diagnosis not present

## 2017-01-26 DIAGNOSIS — M2041 Other hammer toe(s) (acquired), right foot: Secondary | ICD-10-CM

## 2017-01-26 DIAGNOSIS — IMO0002 Reserved for concepts with insufficient information to code with codable children: Secondary | ICD-10-CM

## 2017-01-26 DIAGNOSIS — Z89421 Acquired absence of other right toe(s): Secondary | ICD-10-CM | POA: Diagnosis not present

## 2017-01-26 DIAGNOSIS — Z89412 Acquired absence of left great toe: Secondary | ICD-10-CM | POA: Diagnosis not present

## 2017-01-26 DIAGNOSIS — S98119A Complete traumatic amputation of unspecified great toe, initial encounter: Secondary | ICD-10-CM | POA: Insufficient documentation

## 2017-01-26 DIAGNOSIS — M2042 Other hammer toe(s) (acquired), left foot: Secondary | ICD-10-CM

## 2017-01-26 DIAGNOSIS — E1142 Type 2 diabetes mellitus with diabetic polyneuropathy: Secondary | ICD-10-CM | POA: Diagnosis not present

## 2017-01-26 HISTORY — DX: Reserved for concepts with insufficient information to code with codable children: IMO0002

## 2017-01-26 HISTORY — DX: Complete traumatic amputation of unspecified great toe, initial encounter: S98.119A

## 2017-01-26 HISTORY — DX: Other hammer toe(s) (acquired), right foot: M20.41

## 2017-02-03 DIAGNOSIS — I739 Peripheral vascular disease, unspecified: Secondary | ICD-10-CM | POA: Diagnosis not present

## 2017-02-03 DIAGNOSIS — E1151 Type 2 diabetes mellitus with diabetic peripheral angiopathy without gangrene: Secondary | ICD-10-CM | POA: Diagnosis not present

## 2017-02-03 DIAGNOSIS — Z89421 Acquired absence of other right toe(s): Secondary | ICD-10-CM | POA: Diagnosis not present

## 2017-02-03 DIAGNOSIS — E1142 Type 2 diabetes mellitus with diabetic polyneuropathy: Secondary | ICD-10-CM | POA: Diagnosis not present

## 2017-02-03 DIAGNOSIS — Z89412 Acquired absence of left great toe: Secondary | ICD-10-CM | POA: Diagnosis not present

## 2017-02-05 DIAGNOSIS — E1142 Type 2 diabetes mellitus with diabetic polyneuropathy: Secondary | ICD-10-CM | POA: Diagnosis not present

## 2017-02-25 DIAGNOSIS — Z0000 Encounter for general adult medical examination without abnormal findings: Secondary | ICD-10-CM | POA: Diagnosis not present

## 2017-02-25 DIAGNOSIS — Z794 Long term (current) use of insulin: Secondary | ICD-10-CM | POA: Diagnosis not present

## 2017-02-25 DIAGNOSIS — E785 Hyperlipidemia, unspecified: Secondary | ICD-10-CM | POA: Diagnosis not present

## 2017-02-25 DIAGNOSIS — R29818 Other symptoms and signs involving the nervous system: Secondary | ICD-10-CM | POA: Diagnosis not present

## 2017-02-25 DIAGNOSIS — E1140 Type 2 diabetes mellitus with diabetic neuropathy, unspecified: Secondary | ICD-10-CM | POA: Diagnosis not present

## 2017-02-25 DIAGNOSIS — Z125 Encounter for screening for malignant neoplasm of prostate: Secondary | ICD-10-CM | POA: Diagnosis not present

## 2017-02-25 DIAGNOSIS — Z7984 Long term (current) use of oral hypoglycemic drugs: Secondary | ICD-10-CM | POA: Diagnosis not present

## 2017-03-04 DIAGNOSIS — M25561 Pain in right knee: Secondary | ICD-10-CM | POA: Diagnosis not present

## 2017-03-04 DIAGNOSIS — M48061 Spinal stenosis, lumbar region without neurogenic claudication: Secondary | ICD-10-CM | POA: Diagnosis not present

## 2017-03-04 DIAGNOSIS — Z79899 Other long term (current) drug therapy: Secondary | ICD-10-CM | POA: Diagnosis not present

## 2017-03-04 DIAGNOSIS — Z5181 Encounter for therapeutic drug level monitoring: Secondary | ICD-10-CM | POA: Diagnosis not present

## 2017-03-04 DIAGNOSIS — M47816 Spondylosis without myelopathy or radiculopathy, lumbar region: Secondary | ICD-10-CM | POA: Diagnosis not present

## 2017-03-20 DIAGNOSIS — M25561 Pain in right knee: Secondary | ICD-10-CM | POA: Diagnosis not present

## 2017-03-20 DIAGNOSIS — G8929 Other chronic pain: Secondary | ICD-10-CM | POA: Diagnosis not present

## 2017-03-20 DIAGNOSIS — M25562 Pain in left knee: Secondary | ICD-10-CM | POA: Diagnosis not present

## 2017-03-20 DIAGNOSIS — Z23 Encounter for immunization: Secondary | ICD-10-CM | POA: Diagnosis not present

## 2017-03-20 DIAGNOSIS — M179 Osteoarthritis of knee, unspecified: Secondary | ICD-10-CM | POA: Diagnosis not present

## 2017-03-30 DIAGNOSIS — L97512 Non-pressure chronic ulcer of other part of right foot with fat layer exposed: Secondary | ICD-10-CM | POA: Diagnosis not present

## 2017-03-30 DIAGNOSIS — E1142 Type 2 diabetes mellitus with diabetic polyneuropathy: Secondary | ICD-10-CM | POA: Diagnosis not present

## 2017-03-30 DIAGNOSIS — E11621 Type 2 diabetes mellitus with foot ulcer: Secondary | ICD-10-CM | POA: Diagnosis not present

## 2017-04-08 DIAGNOSIS — R197 Diarrhea, unspecified: Secondary | ICD-10-CM | POA: Diagnosis not present

## 2017-04-09 DIAGNOSIS — N401 Enlarged prostate with lower urinary tract symptoms: Secondary | ICD-10-CM | POA: Diagnosis not present

## 2017-04-18 DIAGNOSIS — J189 Pneumonia, unspecified organism: Secondary | ICD-10-CM | POA: Diagnosis not present

## 2017-05-01 DIAGNOSIS — Z89422 Acquired absence of other left toe(s): Secondary | ICD-10-CM | POA: Diagnosis not present

## 2017-05-01 DIAGNOSIS — M2041 Other hammer toe(s) (acquired), right foot: Secondary | ICD-10-CM | POA: Diagnosis not present

## 2017-05-01 DIAGNOSIS — Z89421 Acquired absence of other right toe(s): Secondary | ICD-10-CM | POA: Diagnosis not present

## 2017-05-01 DIAGNOSIS — E1142 Type 2 diabetes mellitus with diabetic polyneuropathy: Secondary | ICD-10-CM | POA: Diagnosis not present

## 2017-05-01 DIAGNOSIS — Z89412 Acquired absence of left great toe: Secondary | ICD-10-CM | POA: Diagnosis not present

## 2017-05-05 DIAGNOSIS — Z9229 Personal history of other drug therapy: Secondary | ICD-10-CM | POA: Diagnosis not present

## 2017-05-05 DIAGNOSIS — M5441 Lumbago with sciatica, right side: Secondary | ICD-10-CM | POA: Diagnosis not present

## 2017-05-05 DIAGNOSIS — M48062 Spinal stenosis, lumbar region with neurogenic claudication: Secondary | ICD-10-CM | POA: Diagnosis not present

## 2017-05-05 DIAGNOSIS — M5442 Lumbago with sciatica, left side: Secondary | ICD-10-CM | POA: Diagnosis not present

## 2017-05-05 DIAGNOSIS — G8929 Other chronic pain: Secondary | ICD-10-CM | POA: Diagnosis not present

## 2017-06-07 DIAGNOSIS — R0602 Shortness of breath: Secondary | ICD-10-CM | POA: Diagnosis not present

## 2017-06-07 DIAGNOSIS — R410 Disorientation, unspecified: Secondary | ICD-10-CM | POA: Diagnosis not present

## 2017-06-07 DIAGNOSIS — E1122 Type 2 diabetes mellitus with diabetic chronic kidney disease: Secondary | ICD-10-CM | POA: Diagnosis not present

## 2017-06-07 DIAGNOSIS — I129 Hypertensive chronic kidney disease with stage 1 through stage 4 chronic kidney disease, or unspecified chronic kidney disease: Secondary | ICD-10-CM | POA: Diagnosis not present

## 2017-06-07 DIAGNOSIS — Z86711 Personal history of pulmonary embolism: Secondary | ICD-10-CM | POA: Diagnosis not present

## 2017-06-07 DIAGNOSIS — R072 Precordial pain: Secondary | ICD-10-CM | POA: Diagnosis not present

## 2017-06-07 DIAGNOSIS — M549 Dorsalgia, unspecified: Secondary | ICD-10-CM | POA: Diagnosis not present

## 2017-06-07 DIAGNOSIS — I10 Essential (primary) hypertension: Secondary | ICD-10-CM | POA: Diagnosis not present

## 2017-06-07 DIAGNOSIS — E7800 Pure hypercholesterolemia, unspecified: Secondary | ICD-10-CM | POA: Diagnosis not present

## 2017-06-07 DIAGNOSIS — E119 Type 2 diabetes mellitus without complications: Secondary | ICD-10-CM | POA: Diagnosis not present

## 2017-06-07 DIAGNOSIS — R0789 Other chest pain: Secondary | ICD-10-CM | POA: Diagnosis not present

## 2017-06-07 DIAGNOSIS — Z794 Long term (current) use of insulin: Secondary | ICD-10-CM | POA: Diagnosis not present

## 2017-06-07 DIAGNOSIS — G8929 Other chronic pain: Secondary | ICD-10-CM | POA: Diagnosis not present

## 2017-06-07 DIAGNOSIS — Z7984 Long term (current) use of oral hypoglycemic drugs: Secondary | ICD-10-CM | POA: Diagnosis not present

## 2017-06-07 DIAGNOSIS — R079 Chest pain, unspecified: Secondary | ICD-10-CM | POA: Diagnosis not present

## 2017-06-07 DIAGNOSIS — N183 Chronic kidney disease, stage 3 (moderate): Secondary | ICD-10-CM | POA: Diagnosis not present

## 2017-06-07 DIAGNOSIS — Z7901 Long term (current) use of anticoagulants: Secondary | ICD-10-CM | POA: Diagnosis not present

## 2017-06-07 DIAGNOSIS — Z79899 Other long term (current) drug therapy: Secondary | ICD-10-CM | POA: Diagnosis not present

## 2017-06-07 DIAGNOSIS — I2510 Atherosclerotic heart disease of native coronary artery without angina pectoris: Secondary | ICD-10-CM | POA: Diagnosis not present

## 2017-06-08 DIAGNOSIS — R0789 Other chest pain: Secondary | ICD-10-CM | POA: Diagnosis not present

## 2017-06-08 DIAGNOSIS — R079 Chest pain, unspecified: Secondary | ICD-10-CM | POA: Diagnosis not present

## 2017-06-08 DIAGNOSIS — I10 Essential (primary) hypertension: Secondary | ICD-10-CM | POA: Diagnosis not present

## 2017-06-08 DIAGNOSIS — E119 Type 2 diabetes mellitus without complications: Secondary | ICD-10-CM | POA: Diagnosis not present

## 2017-06-09 ENCOUNTER — Telehealth: Payer: Self-pay

## 2017-06-09 DIAGNOSIS — R079 Chest pain, unspecified: Secondary | ICD-10-CM

## 2017-06-09 DIAGNOSIS — I2699 Other pulmonary embolism without acute cor pulmonale: Secondary | ICD-10-CM | POA: Insufficient documentation

## 2017-06-09 DIAGNOSIS — K5790 Diverticulosis of intestine, part unspecified, without perforation or abscess without bleeding: Secondary | ICD-10-CM

## 2017-06-09 DIAGNOSIS — M545 Low back pain: Secondary | ICD-10-CM | POA: Diagnosis not present

## 2017-06-09 DIAGNOSIS — M1990 Unspecified osteoarthritis, unspecified site: Secondary | ICD-10-CM

## 2017-06-09 DIAGNOSIS — G8929 Other chronic pain: Secondary | ICD-10-CM | POA: Diagnosis not present

## 2017-06-09 HISTORY — DX: Chest pain, unspecified: R07.9

## 2017-06-09 HISTORY — DX: Unspecified osteoarthritis, unspecified site: M19.90

## 2017-06-09 HISTORY — DX: Other pulmonary embolism without acute cor pulmonale: I26.99

## 2017-06-09 HISTORY — DX: Diverticulosis of intestine, part unspecified, without perforation or abscess without bleeding: K57.90

## 2017-06-09 NOTE — Telephone Encounter (Signed)
Patient contacted regarding discharge from [facility] on 06/08/17.  Patient understands to follow up with provider Dr. Bettina Gavia on 06/12/17 at 10:40 am (arrive around 10:20 to begin paperwork) at [facility]. Patient understands discharge instructions? No Patient understands medications and regiment? No Patient understands to bring all medications to this visit? Yes  Patient states that he did not really get any discharge teaching from [facility] Patient states that they handed him his packet and said he was "good to go." Patient does state that they advised him to stop taking his Eliquis. Otherwise, he is unaware of any medication changes. Patient states he is going to pick up his new prescriptions from the pharmacy today. Advised patient to bring medication bottles to his appointment rather than his last list as changes may have been made at [facility]. Patient states that he also never received his test results. Due to this fact, we moved appointment up from 06/22/17 to 06/12/17. Records will be retrieved from [facility] and available for Dr. Bettina Gavia to review prior to appointment date.  Patient states that he has our address, but wanted to know the area of our location. Directions from [place] given. Patient verbalized understanding. No further questions at this time.

## 2017-06-11 DIAGNOSIS — I2510 Atherosclerotic heart disease of native coronary artery without angina pectoris: Secondary | ICD-10-CM | POA: Insufficient documentation

## 2017-06-11 DIAGNOSIS — Z7901 Long term (current) use of anticoagulants: Secondary | ICD-10-CM | POA: Insufficient documentation

## 2017-06-11 DIAGNOSIS — I739 Peripheral vascular disease, unspecified: Secondary | ICD-10-CM | POA: Insufficient documentation

## 2017-06-11 NOTE — Progress Notes (Deleted)
Cardiology Office Note:    Date:  06/11/2017   ID:  [place], DOB 07-Aug-1960, MRN [id number]  PCP:  Remer Macho, FNP  Cardiologist:  Shirlee More, MD   Referring MD: Remer Macho, FNP  ASSESSMENT:    1. CAD in native artery   2. Chronic anticoagulation   3. PAD (peripheral artery disease) (HCC)    PLAN:    In order of problems listed above:  1. ***  Next appointment   Medication Adjustments/Labs and Tests Ordered: Current medicines are reviewed at length with the patient today.  Concerns regarding medicines are outlined above.  No orders of the defined types were placed in this encounter.  No orders of the defined types were placed in this encounter.    No chief complaint on file. ***  History of Present Illness:    [place] is a 56 y.o. male who is being seen today for the evaluation of chest pain with recent [facility] admission. His troponins were normal  And MPI small fixed defect no ischemia, normal EF 56% and LV segmental function at the request of Remer Macho, FNP. His record from 2016 relates previous normal coronary arteriography.   Past Medical History:  Diagnosis Date  . Abscess of left great toe 03/17/2016  . Anemia 04/02/2014  . Arthritis 06/09/2017  . Cellulitis and abscess of toe 05/05/2016  . Cellulitis of third toe, left 11/19/2016  . Chest pain 06/09/2017  . Chronic low back pain 12/19/2014  . Chronic osteomyelitis of left foot with draining sinus ([place]) 05/08/2016  . Chronic ulcer of great toe of right foot with fat layer exposed ([place]) 03/31/2014  . Class 2 obesity due to excess calories with serious comorbidity and body mass index (BMI) of 36.0 to 36.9 in adult 04/02/2014  . DDD (degenerative disc disease), lumbar 06/14/2015  . Diabetes mellitus without complication ([place])   . Diabetic ulcer of toe of left foot associated with type 2 diabetes mellitus, with necrosis of bone ([place]) 03/14/2016  . Diverticulosis 06/09/2017  . DM type  2 with diabetic peripheral neuropathy ([place]) 01/21/2016  . Dorsalgia 04/07/2014   Overview:  10/1 IMO update  . Dyslipidemia 04/02/2014  . Erectile dysfunction due to arterial insufficiency 05/25/2015  . Foreign body in left foot with infection 01/21/2016  . Hammer toes of both feet 01/26/2017  . Hyperlipidemia 04/08/2016  . Hypertension   . Hyponatremia 11/22/2014  . Lower limb amputation, great toe ([place]) 01/26/2017  . Lumbar disc displacement without myelopathy 12/10/2016   Overview:  Added automatically from request for surgery [id number]  . Lumbar neuritis 12/10/2016   Overview:  Added automatically from request for surgery [id number]  . Lumbosacral radiculopathy 09/10/2015  . Osteomyelitis of toe of left foot ([place]) 01/16/2015  . Other toe(s) amputation status 01/26/2017  . Peripheral vascular disease ([place]) 04/08/2016  . Pulmonary embolism ([place]) 06/09/2017  . Right hip pain 04/07/2014  . Stenosis, spinal, lumbar 06/14/2015  . Type 2 diabetes mellitus with diabetic peripheral angiopathy without gangrene ([place]) 04/02/2014    Past Surgical History:  Procedure Laterality Date  . CARDIAC CATHETERIZATION     10 years ago  . KNEE CARTILAGE SURGERY Bilateral   . TOE AMPUTATION Left    4 toes    Current Medications: No outpatient medications have been marked as taking for the 06/12/17 encounter (Appointment) with Richardo Priest, MD.     Allergies:   Patient has no known allergies.   Social History  Socioeconomic History  . Marital status: Married    Spouse name: Not on file  . Number of children: Not on file  . Years of education: Not on file  . Highest education level: Not on file  Social Needs  . Financial resource strain: Not on file  . Food insecurity - worry: Not on file  . Food insecurity - inability: Not on file  . Transportation needs - medical: Not on file  . Transportation needs - non-medical: Not on file  Occupational History  . Not on file  Tobacco Use  . Smoking status:  Never Smoker  Substance and Sexual Activity  . Alcohol use: No    Alcohol/week: 0.0 oz  . Drug use: No  . Sexual activity: Not on file  Other Topics Concern  . Not on file  Social History Narrative  . Not on file     Family History: The patient's ***family history includes Cancer in his sister.  ROS:   ROS Please see the history of present illness.    *** All other systems reviewed and are negative.  EKGs/Labs/Other Studies Reviewed:    The following studies were reviewed today: ***  EKG:  EKG is *** ordered today.  The ekg ordered today demonstrates ***  Recent Labs: No results found for requested labs within last 8760 hours.  Recent Lipid Panel No results found for: CHOL, TRIG, HDL, CHOLHDL, VLDL, LDLCALC, LDLDIRECT  Physical Exam:    VS:  There were no vitals taken for this visit.    Wt Readings from Last 3 Encounters:  No data found for Wt     GEN: *** Well nourished, well developed in no acute distress HEENT: Normal NECK: No JVD; No carotid bruits LYMPHATICS: No lymphadenopathy CARDIAC: ***RRR, no murmurs, rubs, gallops RESPIRATORY:  Clear to auscultation without rales, wheezing or rhonchi  ABDOMEN: Soft, non-tender, non-distended MUSCULOSKELETAL:  No edema; No deformity  SKIN: Warm and dry NEUROLOGIC:  Alert and oriented x 3 PSYCHIATRIC:  Normal affect     Signed, Shirlee More, MD  06/11/2017 4:55 PM    [facility]

## 2017-06-12 ENCOUNTER — Ambulatory Visit: Payer: Medicare Other | Admitting: Cardiology

## 2017-06-19 DIAGNOSIS — Z89411 Acquired absence of right great toe: Secondary | ICD-10-CM | POA: Diagnosis not present

## 2017-06-19 DIAGNOSIS — R0789 Other chest pain: Secondary | ICD-10-CM | POA: Diagnosis not present

## 2017-06-19 DIAGNOSIS — Z89412 Acquired absence of left great toe: Secondary | ICD-10-CM | POA: Diagnosis not present

## 2017-06-19 DIAGNOSIS — E860 Dehydration: Secondary | ICD-10-CM | POA: Diagnosis not present

## 2017-06-19 DIAGNOSIS — E1122 Type 2 diabetes mellitus with diabetic chronic kidney disease: Secondary | ICD-10-CM | POA: Diagnosis not present

## 2017-06-19 DIAGNOSIS — E119 Type 2 diabetes mellitus without complications: Secondary | ICD-10-CM | POA: Diagnosis not present

## 2017-06-19 DIAGNOSIS — I4892 Unspecified atrial flutter: Secondary | ICD-10-CM | POA: Diagnosis not present

## 2017-06-19 DIAGNOSIS — R400 Somnolence: Secondary | ICD-10-CM | POA: Diagnosis not present

## 2017-06-19 DIAGNOSIS — N289 Disorder of kidney and ureter, unspecified: Secondary | ICD-10-CM | POA: Diagnosis not present

## 2017-06-19 DIAGNOSIS — I252 Old myocardial infarction: Secondary | ICD-10-CM | POA: Diagnosis not present

## 2017-06-19 DIAGNOSIS — R17 Unspecified jaundice: Secondary | ICD-10-CM | POA: Diagnosis not present

## 2017-06-19 DIAGNOSIS — G8929 Other chronic pain: Secondary | ICD-10-CM | POA: Diagnosis not present

## 2017-06-19 DIAGNOSIS — Z794 Long term (current) use of insulin: Secondary | ICD-10-CM | POA: Diagnosis not present

## 2017-06-19 DIAGNOSIS — I4891 Unspecified atrial fibrillation: Secondary | ICD-10-CM | POA: Diagnosis not present

## 2017-06-19 DIAGNOSIS — J189 Pneumonia, unspecified organism: Secondary | ICD-10-CM | POA: Diagnosis not present

## 2017-06-19 DIAGNOSIS — R143 Flatulence: Secondary | ICD-10-CM | POA: Diagnosis not present

## 2017-06-19 DIAGNOSIS — E875 Hyperkalemia: Secondary | ICD-10-CM | POA: Diagnosis not present

## 2017-06-19 DIAGNOSIS — R000 Tachycardia, unspecified: Secondary | ICD-10-CM | POA: Diagnosis not present

## 2017-06-19 DIAGNOSIS — R197 Diarrhea, unspecified: Secondary | ICD-10-CM | POA: Diagnosis not present

## 2017-06-19 DIAGNOSIS — R140 Abdominal distension (gaseous): Secondary | ICD-10-CM | POA: Diagnosis not present

## 2017-06-19 DIAGNOSIS — I700 Atherosclerosis of aorta: Secondary | ICD-10-CM | POA: Diagnosis not present

## 2017-06-19 DIAGNOSIS — J9601 Acute respiratory failure with hypoxia: Secondary | ICD-10-CM | POA: Diagnosis not present

## 2017-06-19 DIAGNOSIS — I471 Supraventricular tachycardia: Secondary | ICD-10-CM | POA: Diagnosis not present

## 2017-06-19 DIAGNOSIS — K760 Fatty (change of) liver, not elsewhere classified: Secondary | ICD-10-CM | POA: Diagnosis not present

## 2017-06-19 DIAGNOSIS — E1142 Type 2 diabetes mellitus with diabetic polyneuropathy: Secondary | ICD-10-CM | POA: Diagnosis not present

## 2017-06-19 DIAGNOSIS — J9602 Acute respiratory failure with hypercapnia: Secondary | ICD-10-CM | POA: Diagnosis not present

## 2017-06-19 DIAGNOSIS — M545 Low back pain: Secondary | ICD-10-CM | POA: Diagnosis not present

## 2017-06-19 DIAGNOSIS — R935 Abnormal findings on diagnostic imaging of other abdominal regions, including retroperitoneum: Secondary | ICD-10-CM | POA: Diagnosis not present

## 2017-06-19 DIAGNOSIS — R9431 Abnormal electrocardiogram [ECG] [EKG]: Secondary | ICD-10-CM | POA: Diagnosis not present

## 2017-06-19 DIAGNOSIS — R079 Chest pain, unspecified: Secondary | ICD-10-CM | POA: Diagnosis not present

## 2017-06-19 DIAGNOSIS — R933 Abnormal findings on diagnostic imaging of other parts of digestive tract: Secondary | ICD-10-CM | POA: Diagnosis not present

## 2017-06-19 DIAGNOSIS — M48061 Spinal stenosis, lumbar region without neurogenic claudication: Secondary | ICD-10-CM | POA: Diagnosis not present

## 2017-06-19 DIAGNOSIS — M47816 Spondylosis without myelopathy or radiculopathy, lumbar region: Secondary | ICD-10-CM | POA: Diagnosis not present

## 2017-06-19 DIAGNOSIS — I10 Essential (primary) hypertension: Secondary | ICD-10-CM | POA: Diagnosis not present

## 2017-06-19 DIAGNOSIS — K859 Acute pancreatitis without necrosis or infection, unspecified: Secondary | ICD-10-CM | POA: Diagnosis not present

## 2017-06-19 DIAGNOSIS — I480 Paroxysmal atrial fibrillation: Secondary | ICD-10-CM | POA: Diagnosis not present

## 2017-06-19 DIAGNOSIS — E1165 Type 2 diabetes mellitus with hyperglycemia: Secondary | ICD-10-CM | POA: Diagnosis not present

## 2017-06-19 DIAGNOSIS — Z89422 Acquired absence of other left toe(s): Secondary | ICD-10-CM | POA: Diagnosis not present

## 2017-06-19 DIAGNOSIS — Z89429 Acquired absence of other toe(s), unspecified side: Secondary | ICD-10-CM | POA: Diagnosis not present

## 2017-06-19 DIAGNOSIS — Z7901 Long term (current) use of anticoagulants: Secondary | ICD-10-CM | POA: Diagnosis not present

## 2017-06-19 DIAGNOSIS — R0602 Shortness of breath: Secondary | ICD-10-CM | POA: Diagnosis not present

## 2017-06-19 DIAGNOSIS — N179 Acute kidney failure, unspecified: Secondary | ICD-10-CM | POA: Diagnosis not present

## 2017-06-19 DIAGNOSIS — D72829 Elevated white blood cell count, unspecified: Secondary | ICD-10-CM | POA: Diagnosis not present

## 2017-06-22 ENCOUNTER — Ambulatory Visit: Payer: Medicare Other | Admitting: Cardiology

## 2017-06-22 DIAGNOSIS — I480 Paroxysmal atrial fibrillation: Secondary | ICD-10-CM | POA: Insufficient documentation

## 2017-06-22 MED ORDER — DEXTROSE 50 % IV SOLN
12.00 g | INTRAVENOUS | Status: DC
Start: ? — End: 2017-06-22

## 2017-06-22 MED ORDER — GENERIC EXTERNAL MEDICATION
0.00 | Status: DC
Start: ? — End: 2017-06-22

## 2017-06-22 MED ORDER — ACETAMINOPHEN 325 MG PO TABS
650.00 | ORAL_TABLET | ORAL | Status: DC
Start: ? — End: 2017-06-22

## 2017-06-22 MED ORDER — OXYCODONE HCL 5 MG PO TABS
10.00 | ORAL_TABLET | ORAL | Status: DC
Start: ? — End: 2017-06-22

## 2017-06-22 MED ORDER — PREGABALIN 100 MG PO CAPS
100.00 | ORAL_CAPSULE | ORAL | Status: DC
Start: 2017-06-22 — End: 2017-06-22

## 2017-06-22 MED ORDER — GENERIC EXTERNAL MEDICATION
2.00 g | Status: DC
Start: 2017-06-22 — End: 2017-06-22

## 2017-06-22 MED ORDER — FENOFIBRATE 145 MG PO TABS
145.00 | ORAL_TABLET | ORAL | Status: DC
Start: 2017-06-22 — End: 2017-06-22

## 2017-06-22 MED ORDER — ONDANSETRON HCL 4 MG/2ML IJ SOLN
4.00 | INTRAMUSCULAR | Status: DC
Start: ? — End: 2017-06-22

## 2017-06-22 MED ORDER — INSULIN LISPRO 100 UNIT/ML ~~LOC~~ SOLN
1.00 | SUBCUTANEOUS | Status: DC
Start: 2017-06-22 — End: 2017-06-22

## 2017-06-22 MED ORDER — GENERIC EXTERNAL MEDICATION
1.00 | Status: DC
Start: ? — End: 2017-06-22

## 2017-06-22 MED ORDER — HEPARIN SODIUM (PORCINE) 5000 UNIT/ML IJ SOLN
5000.00 | INTRAMUSCULAR | Status: DC
Start: 2017-06-22 — End: 2017-06-22

## 2017-06-22 MED ORDER — GENERIC EXTERNAL MEDICATION
10.00 | Status: DC
Start: 2017-06-22 — End: 2017-06-22

## 2017-06-22 MED ORDER — LABETALOL HCL 5 MG/ML IV SOLN
10.00 | INTRAVENOUS | Status: DC
Start: ? — End: 2017-06-22

## 2017-06-22 MED ORDER — CLONAZEPAM 1 MG PO TABS
1.00 | ORAL_TABLET | ORAL | Status: DC
Start: 2017-06-22 — End: 2017-06-22

## 2017-06-22 MED ORDER — GENERIC EXTERNAL MEDICATION
2.25 g | Status: DC
Start: 2017-06-22 — End: 2017-06-22

## 2017-06-22 MED ORDER — ATORVASTATIN CALCIUM 40 MG PO TABS
40.00 | ORAL_TABLET | ORAL | Status: DC
Start: 2017-06-22 — End: 2017-06-22

## 2017-06-22 MED ORDER — METOPROLOL TARTRATE 25 MG PO TABS
12.50 | ORAL_TABLET | ORAL | Status: DC
Start: 2017-06-22 — End: 2017-06-22

## 2017-06-22 MED ORDER — GLUCOSE 40 % PO GEL
15.00 g | ORAL | Status: DC
Start: ? — End: 2017-06-22

## 2017-06-22 MED ORDER — TRAZODONE HCL 50 MG PO TABS
50.00 | ORAL_TABLET | ORAL | Status: DC
Start: 2017-06-22 — End: 2017-06-22

## 2017-06-22 MED ORDER — HYDRALAZINE HCL 20 MG/ML IJ SOLN
10.00 | INTRAMUSCULAR | Status: DC
Start: ? — End: 2017-06-22

## 2017-06-22 MED ORDER — MORPHINE SULFATE 4 MG/ML IJ SOLN
2.00 | INTRAMUSCULAR | Status: DC
Start: ? — End: 2017-06-22

## 2017-06-22 NOTE — Progress Notes (Deleted)
Cardiology Office Note:    Date:  06/22/2017   ID:  [place], DOB 1960/07/10, MRN [id number]  PCP:  Remer Macho, FNP  Cardiologist:  Shirlee More, MD   Referring MD: Remer Macho, FNP  ASSESSMENT:    No diagnosis found. PLAN:    In order of problems listed above:  1. ***  Next appointment   Medication Adjustments/Labs and Tests Ordered: Current medicines are reviewed at length with the patient today.  Concerns regarding medicines are outlined above.  No orders of the defined types were placed in this encounter.  No orders of the defined types were placed in this encounter.    No chief complaint on file. ***  History of Present Illness:    [place] is a 56 y.o. male with DM, Hypertension, hyperlipidemia, Stage 2 CKD and PADwho is being seen today for the evaluation of chest pain.  He was seen at [facility] ED 06/07/2017.  In the emergency room department his EKG was read as age-indeterminate inferior MI troponin was normal chest x-ray was normal.  At the request of Remer Macho, FNP.  He has a history of pulmonary embolism in January 2018 and has been on long-term anticoagulation.  CTA was performed chest 06/07/2017 which showed no evidence of pulmonary thromboembolism but he did have myocardial cardiac calcification.  He underwent a myocardial perfusion study which showed a small reversible perfusion defect involving the apex with an ejection fraction 56% there was a recommendation for outpatient cardiology follow-up and cardiac catheterization.  Past Medical History:  Diagnosis Date  . Abscess of left great toe 03/17/2016  . Anemia 04/02/2014  . Arthritis 06/09/2017  . Cellulitis and abscess of toe 05/05/2016  . Cellulitis of third toe, left 11/19/2016  . Chest pain 06/09/2017  . Chronic low back pain 12/19/2014  . Chronic osteomyelitis of left foot with draining sinus ([place]) 05/08/2016  . Chronic ulcer of great toe of right foot with fat  layer exposed ([place]) 03/31/2014  . Class 2 obesity due to excess calories with serious comorbidity and body mass index (BMI) of 36.0 to 36.9 in adult 04/02/2014  . DDD (degenerative disc disease), lumbar 06/14/2015  . Diabetes mellitus without complication ([place])   . Diabetic ulcer of toe of left foot associated with type 2 diabetes mellitus, with necrosis of bone ([place]) 03/14/2016  . Diverticulosis 06/09/2017  . DM type 2 with diabetic peripheral neuropathy ([place]) 01/21/2016  . Dorsalgia 04/07/2014   Overview:  10/1 IMO update  . Dyslipidemia 04/02/2014  . Erectile dysfunction due to arterial insufficiency 05/25/2015  . Foreign body in left foot with infection 01/21/2016  . Hammer toes of both feet 01/26/2017  . Hyperlipidemia 04/08/2016  . Hypertension   . Hyponatremia 11/22/2014  . Lower limb amputation, great toe ([place]) 01/26/2017  . Lumbar disc displacement without myelopathy 12/10/2016   Overview:  Added automatically from request for surgery [id number]  . Lumbar neuritis 12/10/2016   Overview:  Added automatically from request for surgery [id number]  . Lumbosacral radiculopathy 09/10/2015  . Osteomyelitis of toe of left foot ([place]) 01/16/2015  . Other toe(s) amputation status 01/26/2017  . Peripheral vascular disease ([place]) 04/08/2016  . Pulmonary embolism ([place]) 06/09/2017  . Right hip pain 04/07/2014  . Stenosis, spinal, lumbar 06/14/2015  . Type 2 diabetes mellitus with diabetic peripheral angiopathy without gangrene ([place]) 04/02/2014    Past Surgical History:  Procedure Laterality Date  . CARDIAC CATHETERIZATION     10  years ago  . KNEE CARTILAGE SURGERY Bilateral   . TOE AMPUTATION Left    4 toes    Current Medications: No outpatient medications have been marked as taking for the 06/22/17 encounter (Appointment) with Richardo Priest, MD.     Allergies:   Patient has no known allergies.   Social History   Socioeconomic History  . Marital status: Married    Spouse name: Not on file  .  Number of children: Not on file  . Years of education: Not on file  . Highest education level: Not on file  Social Needs  . Financial resource strain: Not on file  . Food insecurity - worry: Not on file  . Food insecurity - inability: Not on file  . Transportation needs - medical: Not on file  . Transportation needs - non-medical: Not on file  Occupational History  . Not on file  Tobacco Use  . Smoking status: Never Smoker  Substance and Sexual Activity  . Alcohol use: No    Alcohol/week: 0.0 oz  . Drug use: No  . Sexual activity: Not on file  Other Topics Concern  . Not on file  Social History Narrative  . Not on file     Family History: The patient's ***family history includes Cancer in his sister.  ROS:   ROS Please see the history of present illness.    *** All other systems reviewed and are negative.  EKGs/Labs/Other Studies Reviewed:    The following studies were reviewed today: ***  EKG:  EKG is *** ordered today.  The ekg ordered today demonstrates *** CTA 06/07/17: no PE, CAC present Recent Labs: CBC CMP normal Cr 1.10 No results found for requested labs within last 8760 hours.  Recent Lipid Panel serial troponin at [facility] were all normal No results found for: CHOL, TRIG, HDL, CHOLHDL, VLDL, LDLCALC, LDLDIRECT  Physical Exam:    VS:  There were no vitals taken for this visit.    Wt Readings from Last 3 Encounters:  No data found for Wt     GEN: *** Well nourished, well developed in no acute distress HEENT: Normal NECK: No JVD; No carotid bruits LYMPHATICS: No lymphadenopathy CARDIAC: ***RRR, no murmurs, rubs, gallops RESPIRATORY:  Clear to auscultation without rales, wheezing or rhonchi  ABDOMEN: Soft, non-tender, non-distended MUSCULOSKELETAL:  No edema; No deformity  SKIN: Warm and dry NEUROLOGIC:  Alert and oriented x 3 PSYCHIATRIC:  Normal affect     Signed, Shirlee More, MD  06/22/2017 7:36 AM    [place]

## 2017-06-27 DIAGNOSIS — K8511 Biliary acute pancreatitis with uninfected necrosis: Secondary | ICD-10-CM | POA: Diagnosis not present

## 2017-06-27 DIAGNOSIS — G8929 Other chronic pain: Secondary | ICD-10-CM | POA: Diagnosis not present

## 2017-06-27 DIAGNOSIS — R011 Cardiac murmur, unspecified: Secondary | ICD-10-CM | POA: Diagnosis not present

## 2017-06-27 DIAGNOSIS — Z86718 Personal history of other venous thrombosis and embolism: Secondary | ICD-10-CM | POA: Diagnosis not present

## 2017-06-27 DIAGNOSIS — K8591 Acute pancreatitis with uninfected necrosis, unspecified: Secondary | ICD-10-CM | POA: Diagnosis not present

## 2017-06-27 DIAGNOSIS — Z89412 Acquired absence of left great toe: Secondary | ICD-10-CM | POA: Diagnosis not present

## 2017-06-27 DIAGNOSIS — K859 Acute pancreatitis without necrosis or infection, unspecified: Secondary | ICD-10-CM | POA: Diagnosis not present

## 2017-06-27 DIAGNOSIS — Z89422 Acquired absence of other left toe(s): Secondary | ICD-10-CM | POA: Diagnosis not present

## 2017-06-27 DIAGNOSIS — Z794 Long term (current) use of insulin: Secondary | ICD-10-CM | POA: Diagnosis not present

## 2017-06-27 DIAGNOSIS — I10 Essential (primary) hypertension: Secondary | ICD-10-CM | POA: Diagnosis not present

## 2017-06-27 DIAGNOSIS — N179 Acute kidney failure, unspecified: Secondary | ICD-10-CM | POA: Diagnosis not present

## 2017-06-27 DIAGNOSIS — E785 Hyperlipidemia, unspecified: Secondary | ICD-10-CM | POA: Diagnosis not present

## 2017-06-27 DIAGNOSIS — Z431 Encounter for attention to gastrostomy: Secondary | ICD-10-CM | POA: Diagnosis not present

## 2017-06-27 DIAGNOSIS — M1990 Unspecified osteoarthritis, unspecified site: Secondary | ICD-10-CM | POA: Diagnosis not present

## 2017-06-27 DIAGNOSIS — R000 Tachycardia, unspecified: Secondary | ICD-10-CM | POA: Diagnosis not present

## 2017-06-27 DIAGNOSIS — R109 Unspecified abdominal pain: Secondary | ICD-10-CM | POA: Diagnosis not present

## 2017-06-27 DIAGNOSIS — R509 Fever, unspecified: Secondary | ICD-10-CM | POA: Diagnosis not present

## 2017-06-27 DIAGNOSIS — Z7984 Long term (current) use of oral hypoglycemic drugs: Secondary | ICD-10-CM | POA: Diagnosis not present

## 2017-06-27 DIAGNOSIS — Z89411 Acquired absence of right great toe: Secondary | ICD-10-CM | POA: Diagnosis not present

## 2017-06-27 DIAGNOSIS — M545 Low back pain: Secondary | ICD-10-CM | POA: Diagnosis not present

## 2017-06-27 DIAGNOSIS — I484 Atypical atrial flutter: Secondary | ICD-10-CM | POA: Diagnosis not present

## 2017-06-27 DIAGNOSIS — K8502 Idiopathic acute pancreatitis with infected necrosis: Secondary | ICD-10-CM | POA: Diagnosis not present

## 2017-06-27 DIAGNOSIS — K5900 Constipation, unspecified: Secondary | ICD-10-CM | POA: Diagnosis not present

## 2017-06-27 DIAGNOSIS — E1165 Type 2 diabetes mellitus with hyperglycemia: Secondary | ICD-10-CM | POA: Diagnosis not present

## 2017-06-27 DIAGNOSIS — I480 Paroxysmal atrial fibrillation: Secondary | ICD-10-CM | POA: Diagnosis not present

## 2017-06-27 DIAGNOSIS — Z86711 Personal history of pulmonary embolism: Secondary | ICD-10-CM | POA: Diagnosis not present

## 2017-06-27 DIAGNOSIS — E875 Hyperkalemia: Secondary | ICD-10-CM | POA: Diagnosis not present

## 2017-06-27 DIAGNOSIS — I5031 Acute diastolic (congestive) heart failure: Secondary | ICD-10-CM | POA: Diagnosis not present

## 2017-06-27 DIAGNOSIS — Z791 Long term (current) use of non-steroidal anti-inflammatories (NSAID): Secondary | ICD-10-CM | POA: Diagnosis not present

## 2017-06-27 DIAGNOSIS — G4700 Insomnia, unspecified: Secondary | ICD-10-CM | POA: Diagnosis not present

## 2017-06-27 DIAGNOSIS — R140 Abdominal distension (gaseous): Secondary | ICD-10-CM | POA: Diagnosis not present

## 2017-06-27 DIAGNOSIS — T80818A Extravasation of other vesicant agent, initial encounter: Secondary | ICD-10-CM | POA: Diagnosis not present

## 2017-06-27 DIAGNOSIS — K567 Ileus, unspecified: Secondary | ICD-10-CM | POA: Diagnosis not present

## 2017-06-27 DIAGNOSIS — K710 Toxic liver disease with cholestasis: Secondary | ICD-10-CM | POA: Diagnosis not present

## 2017-06-27 DIAGNOSIS — E1142 Type 2 diabetes mellitus with diabetic polyneuropathy: Secondary | ICD-10-CM | POA: Diagnosis not present

## 2017-07-14 DIAGNOSIS — K828 Other specified diseases of gallbladder: Secondary | ICD-10-CM | POA: Diagnosis not present

## 2017-07-14 DIAGNOSIS — I498 Other specified cardiac arrhythmias: Secondary | ICD-10-CM | POA: Diagnosis not present

## 2017-07-14 DIAGNOSIS — R109 Unspecified abdominal pain: Secondary | ICD-10-CM | POA: Diagnosis not present

## 2017-07-20 DIAGNOSIS — T17590A Other foreign object in bronchus causing asphyxiation, initial encounter: Secondary | ICD-10-CM | POA: Diagnosis not present

## 2017-07-20 DIAGNOSIS — R1310 Dysphagia, unspecified: Secondary | ICD-10-CM | POA: Diagnosis not present

## 2017-07-20 DIAGNOSIS — R1084 Generalized abdominal pain: Secondary | ICD-10-CM | POA: Diagnosis not present

## 2017-07-20 DIAGNOSIS — D62 Acute posthemorrhagic anemia: Secondary | ICD-10-CM | POA: Diagnosis not present

## 2017-07-20 DIAGNOSIS — K8511 Biliary acute pancreatitis with uninfected necrosis: Secondary | ICD-10-CM | POA: Diagnosis not present

## 2017-07-20 DIAGNOSIS — I4891 Unspecified atrial fibrillation: Secondary | ICD-10-CM | POA: Diagnosis not present

## 2017-07-20 DIAGNOSIS — G8911 Acute pain due to trauma: Secondary | ICD-10-CM | POA: Diagnosis not present

## 2017-07-20 DIAGNOSIS — Z9889 Other specified postprocedural states: Secondary | ICD-10-CM | POA: Diagnosis not present

## 2017-07-20 DIAGNOSIS — Z7901 Long term (current) use of anticoagulants: Secondary | ICD-10-CM | POA: Diagnosis not present

## 2017-07-20 DIAGNOSIS — R6881 Early satiety: Secondary | ICD-10-CM | POA: Diagnosis not present

## 2017-07-20 DIAGNOSIS — R000 Tachycardia, unspecified: Secondary | ICD-10-CM | POA: Diagnosis not present

## 2017-07-20 DIAGNOSIS — Z7409 Other reduced mobility: Secondary | ICD-10-CM | POA: Diagnosis not present

## 2017-07-20 DIAGNOSIS — R52 Pain, unspecified: Secondary | ICD-10-CM | POA: Diagnosis not present

## 2017-07-20 DIAGNOSIS — I110 Hypertensive heart disease with heart failure: Secondary | ICD-10-CM | POA: Diagnosis not present

## 2017-07-20 DIAGNOSIS — J9601 Acute respiratory failure with hypoxia: Secondary | ICD-10-CM | POA: Diagnosis not present

## 2017-07-20 DIAGNOSIS — G8918 Other acute postprocedural pain: Secondary | ICD-10-CM | POA: Diagnosis not present

## 2017-07-20 DIAGNOSIS — Z713 Dietary counseling and surveillance: Secondary | ICD-10-CM | POA: Diagnosis not present

## 2017-07-20 DIAGNOSIS — K838 Other specified diseases of biliary tract: Secondary | ICD-10-CM | POA: Diagnosis not present

## 2017-07-20 DIAGNOSIS — I480 Paroxysmal atrial fibrillation: Secondary | ICD-10-CM | POA: Diagnosis not present

## 2017-07-20 DIAGNOSIS — S0990XA Unspecified injury of head, initial encounter: Secondary | ICD-10-CM | POA: Diagnosis not present

## 2017-07-20 DIAGNOSIS — R918 Other nonspecific abnormal finding of lung field: Secondary | ICD-10-CM | POA: Diagnosis not present

## 2017-07-20 DIAGNOSIS — Z942 Lung transplant status: Secondary | ICD-10-CM | POA: Diagnosis not present

## 2017-07-20 DIAGNOSIS — R509 Fever, unspecified: Secondary | ICD-10-CM | POA: Diagnosis not present

## 2017-07-20 DIAGNOSIS — J690 Pneumonitis due to inhalation of food and vomit: Secondary | ICD-10-CM | POA: Diagnosis not present

## 2017-07-20 DIAGNOSIS — Z89421 Acquired absence of other right toe(s): Secondary | ICD-10-CM | POA: Diagnosis not present

## 2017-07-20 DIAGNOSIS — R5381 Other malaise: Secondary | ICD-10-CM | POA: Diagnosis not present

## 2017-07-20 DIAGNOSIS — J952 Acute pulmonary insufficiency following nonthoracic surgery: Secondary | ICD-10-CM | POA: Diagnosis not present

## 2017-07-20 DIAGNOSIS — M545 Low back pain: Secondary | ICD-10-CM | POA: Diagnosis not present

## 2017-07-20 DIAGNOSIS — K863 Pseudocyst of pancreas: Secondary | ICD-10-CM | POA: Diagnosis not present

## 2017-07-20 DIAGNOSIS — E441 Mild protein-calorie malnutrition: Secondary | ICD-10-CM | POA: Diagnosis not present

## 2017-07-20 DIAGNOSIS — J9809 Other diseases of bronchus, not elsewhere classified: Secondary | ICD-10-CM | POA: Diagnosis not present

## 2017-07-20 DIAGNOSIS — R0902 Hypoxemia: Secondary | ICD-10-CM | POA: Diagnosis not present

## 2017-07-20 DIAGNOSIS — I2699 Other pulmonary embolism without acute cor pulmonale: Secondary | ICD-10-CM | POA: Diagnosis not present

## 2017-07-20 DIAGNOSIS — I5030 Unspecified diastolic (congestive) heart failure: Secondary | ICD-10-CM | POA: Diagnosis not present

## 2017-07-20 DIAGNOSIS — I358 Other nonrheumatic aortic valve disorders: Secondary | ICD-10-CM | POA: Diagnosis not present

## 2017-07-20 DIAGNOSIS — I5032 Chronic diastolic (congestive) heart failure: Secondary | ICD-10-CM | POA: Diagnosis not present

## 2017-07-20 DIAGNOSIS — R531 Weakness: Secondary | ICD-10-CM | POA: Diagnosis not present

## 2017-07-20 DIAGNOSIS — K8592 Acute pancreatitis with infected necrosis, unspecified: Secondary | ICD-10-CM | POA: Diagnosis not present

## 2017-07-20 DIAGNOSIS — Z89422 Acquired absence of other left toe(s): Secondary | ICD-10-CM | POA: Diagnosis not present

## 2017-07-20 DIAGNOSIS — J811 Chronic pulmonary edema: Secondary | ICD-10-CM | POA: Diagnosis not present

## 2017-07-20 DIAGNOSIS — J90 Pleural effusion, not elsewhere classified: Secondary | ICD-10-CM | POA: Diagnosis not present

## 2017-07-20 DIAGNOSIS — E1140 Type 2 diabetes mellitus with diabetic neuropathy, unspecified: Secondary | ICD-10-CM | POA: Diagnosis not present

## 2017-07-20 DIAGNOSIS — R109 Unspecified abdominal pain: Secondary | ICD-10-CM | POA: Diagnosis not present

## 2017-07-20 DIAGNOSIS — R0989 Other specified symptoms and signs involving the circulatory and respiratory systems: Secondary | ICD-10-CM | POA: Diagnosis not present

## 2017-07-20 DIAGNOSIS — R110 Nausea: Secondary | ICD-10-CM | POA: Diagnosis not present

## 2017-07-20 DIAGNOSIS — E785 Hyperlipidemia, unspecified: Secondary | ICD-10-CM | POA: Diagnosis not present

## 2017-07-20 DIAGNOSIS — Z89429 Acquired absence of other toe(s), unspecified side: Secondary | ICD-10-CM | POA: Diagnosis not present

## 2017-07-20 DIAGNOSIS — K859 Acute pancreatitis without necrosis or infection, unspecified: Secondary | ICD-10-CM | POA: Diagnosis not present

## 2017-07-20 DIAGNOSIS — Z794 Long term (current) use of insulin: Secondary | ICD-10-CM | POA: Diagnosis not present

## 2017-07-20 DIAGNOSIS — Z978 Presence of other specified devices: Secondary | ICD-10-CM | POA: Diagnosis not present

## 2017-07-20 DIAGNOSIS — G8929 Other chronic pain: Secondary | ICD-10-CM | POA: Diagnosis not present

## 2017-07-20 DIAGNOSIS — Z86711 Personal history of pulmonary embolism: Secondary | ICD-10-CM | POA: Diagnosis not present

## 2017-07-20 DIAGNOSIS — I959 Hypotension, unspecified: Secondary | ICD-10-CM | POA: Diagnosis not present

## 2017-07-20 DIAGNOSIS — K8010 Calculus of gallbladder with chronic cholecystitis without obstruction: Secondary | ICD-10-CM | POA: Diagnosis not present

## 2017-07-20 DIAGNOSIS — J189 Pneumonia, unspecified organism: Secondary | ICD-10-CM | POA: Diagnosis not present

## 2017-07-20 DIAGNOSIS — Z89411 Acquired absence of right great toe: Secondary | ICD-10-CM | POA: Diagnosis not present

## 2017-07-20 DIAGNOSIS — Z431 Encounter for attention to gastrostomy: Secondary | ICD-10-CM | POA: Diagnosis not present

## 2017-07-20 DIAGNOSIS — E1142 Type 2 diabetes mellitus with diabetic polyneuropathy: Secondary | ICD-10-CM | POA: Diagnosis not present

## 2017-07-20 DIAGNOSIS — Z89412 Acquired absence of left great toe: Secondary | ICD-10-CM | POA: Diagnosis not present

## 2017-07-20 DIAGNOSIS — R55 Syncope and collapse: Secondary | ICD-10-CM | POA: Diagnosis not present

## 2017-07-20 DIAGNOSIS — I9789 Other postprocedural complications and disorders of the circulatory system, not elsewhere classified: Secondary | ICD-10-CM | POA: Diagnosis not present

## 2017-07-20 DIAGNOSIS — E1165 Type 2 diabetes mellitus with hyperglycemia: Secondary | ICD-10-CM | POA: Diagnosis not present

## 2017-07-20 DIAGNOSIS — I10 Essential (primary) hypertension: Secondary | ICD-10-CM | POA: Diagnosis not present

## 2017-07-20 DIAGNOSIS — K8591 Acute pancreatitis with uninfected necrosis, unspecified: Secondary | ICD-10-CM | POA: Diagnosis not present

## 2017-07-27 DIAGNOSIS — K8591 Acute pancreatitis with uninfected necrosis, unspecified: Secondary | ICD-10-CM | POA: Diagnosis not present

## 2017-07-27 DIAGNOSIS — J9809 Other diseases of bronchus, not elsewhere classified: Secondary | ICD-10-CM | POA: Diagnosis not present

## 2017-07-27 DIAGNOSIS — Z9889 Other specified postprocedural states: Secondary | ICD-10-CM | POA: Diagnosis not present

## 2017-07-27 DIAGNOSIS — R918 Other nonspecific abnormal finding of lung field: Secondary | ICD-10-CM | POA: Diagnosis not present

## 2017-07-27 DIAGNOSIS — J90 Pleural effusion, not elsewhere classified: Secondary | ICD-10-CM | POA: Diagnosis not present

## 2017-07-27 DIAGNOSIS — Z7409 Other reduced mobility: Secondary | ICD-10-CM | POA: Diagnosis not present

## 2017-08-05 DIAGNOSIS — M1991 Primary osteoarthritis, unspecified site: Secondary | ICD-10-CM | POA: Diagnosis not present

## 2017-08-05 DIAGNOSIS — Z48815 Encounter for surgical aftercare following surgery on the digestive system: Secondary | ICD-10-CM | POA: Diagnosis not present

## 2017-08-05 DIAGNOSIS — I480 Paroxysmal atrial fibrillation: Secondary | ICD-10-CM | POA: Diagnosis not present

## 2017-08-05 DIAGNOSIS — Z794 Long term (current) use of insulin: Secondary | ICD-10-CM | POA: Diagnosis not present

## 2017-08-05 DIAGNOSIS — E1142 Type 2 diabetes mellitus with diabetic polyneuropathy: Secondary | ICD-10-CM | POA: Diagnosis not present

## 2017-08-05 DIAGNOSIS — Z5181 Encounter for therapeutic drug level monitoring: Secondary | ICD-10-CM | POA: Diagnosis not present

## 2017-08-05 DIAGNOSIS — Z8631 Personal history of diabetic foot ulcer: Secondary | ICD-10-CM | POA: Diagnosis not present

## 2017-08-05 DIAGNOSIS — I2510 Atherosclerotic heart disease of native coronary artery without angina pectoris: Secondary | ICD-10-CM | POA: Diagnosis not present

## 2017-08-05 DIAGNOSIS — I509 Heart failure, unspecified: Secondary | ICD-10-CM | POA: Diagnosis not present

## 2017-08-05 DIAGNOSIS — E1151 Type 2 diabetes mellitus with diabetic peripheral angiopathy without gangrene: Secondary | ICD-10-CM | POA: Diagnosis not present

## 2017-08-05 DIAGNOSIS — Z86711 Personal history of pulmonary embolism: Secondary | ICD-10-CM | POA: Diagnosis not present

## 2017-08-05 DIAGNOSIS — I110 Hypertensive heart disease with heart failure: Secondary | ICD-10-CM | POA: Diagnosis not present

## 2017-08-05 DIAGNOSIS — Z7901 Long term (current) use of anticoagulants: Secondary | ICD-10-CM | POA: Diagnosis not present

## 2017-08-07 DIAGNOSIS — I480 Paroxysmal atrial fibrillation: Secondary | ICD-10-CM | POA: Diagnosis not present

## 2017-08-07 DIAGNOSIS — Z86711 Personal history of pulmonary embolism: Secondary | ICD-10-CM | POA: Diagnosis not present

## 2017-08-07 DIAGNOSIS — Z5181 Encounter for therapeutic drug level monitoring: Secondary | ICD-10-CM | POA: Diagnosis not present

## 2017-08-07 DIAGNOSIS — E1151 Type 2 diabetes mellitus with diabetic peripheral angiopathy without gangrene: Secondary | ICD-10-CM | POA: Diagnosis not present

## 2017-08-07 DIAGNOSIS — I110 Hypertensive heart disease with heart failure: Secondary | ICD-10-CM | POA: Diagnosis not present

## 2017-08-07 DIAGNOSIS — Z7901 Long term (current) use of anticoagulants: Secondary | ICD-10-CM | POA: Diagnosis not present

## 2017-08-07 DIAGNOSIS — I509 Heart failure, unspecified: Secondary | ICD-10-CM | POA: Diagnosis not present

## 2017-08-07 DIAGNOSIS — E1142 Type 2 diabetes mellitus with diabetic polyneuropathy: Secondary | ICD-10-CM | POA: Diagnosis not present

## 2017-08-07 DIAGNOSIS — Z48815 Encounter for surgical aftercare following surgery on the digestive system: Secondary | ICD-10-CM | POA: Diagnosis not present

## 2017-08-07 DIAGNOSIS — M1991 Primary osteoarthritis, unspecified site: Secondary | ICD-10-CM | POA: Diagnosis not present

## 2017-08-07 DIAGNOSIS — Z794 Long term (current) use of insulin: Secondary | ICD-10-CM | POA: Diagnosis not present

## 2017-08-07 DIAGNOSIS — I2510 Atherosclerotic heart disease of native coronary artery without angina pectoris: Secondary | ICD-10-CM | POA: Diagnosis not present

## 2017-08-07 DIAGNOSIS — Z8631 Personal history of diabetic foot ulcer: Secondary | ICD-10-CM | POA: Diagnosis not present

## 2017-08-10 DIAGNOSIS — R7989 Other specified abnormal findings of blood chemistry: Secondary | ICD-10-CM | POA: Diagnosis not present

## 2017-08-10 DIAGNOSIS — E11649 Type 2 diabetes mellitus with hypoglycemia without coma: Secondary | ICD-10-CM | POA: Diagnosis not present

## 2017-08-10 DIAGNOSIS — R945 Abnormal results of liver function studies: Secondary | ICD-10-CM | POA: Diagnosis not present

## 2017-08-10 DIAGNOSIS — R112 Nausea with vomiting, unspecified: Secondary | ICD-10-CM | POA: Diagnosis not present

## 2017-08-10 DIAGNOSIS — J9811 Atelectasis: Secondary | ICD-10-CM | POA: Diagnosis not present

## 2017-08-10 DIAGNOSIS — R404 Transient alteration of awareness: Secondary | ICD-10-CM | POA: Diagnosis not present

## 2017-08-10 DIAGNOSIS — R531 Weakness: Secondary | ICD-10-CM | POA: Diagnosis not present

## 2017-08-11 ENCOUNTER — Other Ambulatory Visit: Payer: Self-pay

## 2017-08-11 ENCOUNTER — Encounter (HOSPITAL_COMMUNITY): Payer: Self-pay | Admitting: Emergency Medicine

## 2017-08-11 ENCOUNTER — Emergency Department (HOSPITAL_COMMUNITY): Payer: Medicare Other

## 2017-08-11 ENCOUNTER — Emergency Department (HOSPITAL_COMMUNITY)
Admission: EM | Admit: 2017-08-11 | Discharge: 2017-08-12 | Disposition: A | Discharge status: Other Acute Inpatient Hospital | Payer: Medicare Other | Attending: Emergency Medicine | Admitting: Emergency Medicine

## 2017-08-11 DIAGNOSIS — R740 Nonspecific elevation of levels of transaminase and lactic acid dehydrogenase [LDH]: Secondary | ICD-10-CM | POA: Diagnosis not present

## 2017-08-11 DIAGNOSIS — R112 Nausea with vomiting, unspecified: Secondary | ICD-10-CM

## 2017-08-11 DIAGNOSIS — T8143XA Infection following a procedure, organ and space surgical site, initial encounter: Secondary | ICD-10-CM | POA: Diagnosis not present

## 2017-08-11 DIAGNOSIS — T8140XA Infection following a procedure, unspecified, initial encounter: Secondary | ICD-10-CM | POA: Insufficient documentation

## 2017-08-11 DIAGNOSIS — Z7901 Long term (current) use of anticoagulants: Secondary | ICD-10-CM | POA: Diagnosis not present

## 2017-08-11 DIAGNOSIS — I509 Heart failure, unspecified: Secondary | ICD-10-CM | POA: Diagnosis not present

## 2017-08-11 DIAGNOSIS — K8592 Acute pancreatitis with infected necrosis, unspecified: Secondary | ICD-10-CM | POA: Diagnosis not present

## 2017-08-11 DIAGNOSIS — E119 Type 2 diabetes mellitus without complications: Secondary | ICD-10-CM | POA: Insufficient documentation

## 2017-08-11 DIAGNOSIS — I110 Hypertensive heart disease with heart failure: Secondary | ICD-10-CM | POA: Diagnosis not present

## 2017-08-11 DIAGNOSIS — K859 Acute pancreatitis without necrosis or infection, unspecified: Secondary | ICD-10-CM

## 2017-08-11 DIAGNOSIS — R1110 Vomiting, unspecified: Secondary | ICD-10-CM | POA: Diagnosis not present

## 2017-08-11 DIAGNOSIS — K8689 Other specified diseases of pancreas: Secondary | ICD-10-CM

## 2017-08-11 DIAGNOSIS — Z79899 Other long term (current) drug therapy: Secondary | ICD-10-CM | POA: Diagnosis not present

## 2017-08-11 DIAGNOSIS — R109 Unspecified abdominal pain: Secondary | ICD-10-CM

## 2017-08-11 DIAGNOSIS — Y829 Unspecified medical devices associated with adverse incidents: Secondary | ICD-10-CM | POA: Diagnosis not present

## 2017-08-11 DIAGNOSIS — T8149XA Infection following a procedure, other surgical site, initial encounter: Secondary | ICD-10-CM

## 2017-08-11 DIAGNOSIS — Z48815 Encounter for surgical aftercare following surgery on the digestive system: Secondary | ICD-10-CM | POA: Diagnosis not present

## 2017-08-11 DIAGNOSIS — Z5181 Encounter for therapeutic drug level monitoring: Secondary | ICD-10-CM | POA: Diagnosis not present

## 2017-08-11 DIAGNOSIS — E1142 Type 2 diabetes mellitus with diabetic polyneuropathy: Secondary | ICD-10-CM | POA: Diagnosis not present

## 2017-08-11 DIAGNOSIS — I10 Essential (primary) hypertension: Secondary | ICD-10-CM | POA: Diagnosis not present

## 2017-08-11 DIAGNOSIS — E1151 Type 2 diabetes mellitus with diabetic peripheral angiopathy without gangrene: Secondary | ICD-10-CM | POA: Diagnosis not present

## 2017-08-11 DIAGNOSIS — D729 Disorder of white blood cells, unspecified: Secondary | ICD-10-CM

## 2017-08-11 DIAGNOSIS — N179 Acute kidney failure, unspecified: Secondary | ICD-10-CM | POA: Diagnosis not present

## 2017-08-11 DIAGNOSIS — I2510 Atherosclerotic heart disease of native coronary artery without angina pectoris: Secondary | ICD-10-CM | POA: Diagnosis not present

## 2017-08-11 DIAGNOSIS — M1991 Primary osteoarthritis, unspecified site: Secondary | ICD-10-CM | POA: Diagnosis not present

## 2017-08-11 DIAGNOSIS — Z8631 Personal history of diabetic foot ulcer: Secondary | ICD-10-CM | POA: Diagnosis not present

## 2017-08-11 DIAGNOSIS — K8591 Acute pancreatitis with uninfected necrosis, unspecified: Secondary | ICD-10-CM | POA: Diagnosis not present

## 2017-08-11 DIAGNOSIS — I480 Paroxysmal atrial fibrillation: Secondary | ICD-10-CM | POA: Diagnosis not present

## 2017-08-11 DIAGNOSIS — D72829 Elevated white blood cell count, unspecified: Secondary | ICD-10-CM | POA: Insufficient documentation

## 2017-08-11 DIAGNOSIS — D72828 Other elevated white blood cell count: Secondary | ICD-10-CM

## 2017-08-11 DIAGNOSIS — R7401 Elevation of levels of liver transaminase levels: Secondary | ICD-10-CM

## 2017-08-11 DIAGNOSIS — R17 Unspecified jaundice: Secondary | ICD-10-CM | POA: Insufficient documentation

## 2017-08-11 DIAGNOSIS — Z794 Long term (current) use of insulin: Secondary | ICD-10-CM | POA: Insufficient documentation

## 2017-08-11 DIAGNOSIS — Z86711 Personal history of pulmonary embolism: Secondary | ICD-10-CM | POA: Diagnosis not present

## 2017-08-11 LAB — COMPREHENSIVE METABOLIC PANEL
ALT: 106 U/L — ABNORMAL HIGH (ref 17–63)
AST: 186 U/L — ABNORMAL HIGH (ref 15–41)
Albumin: 2.6 g/dL — ABNORMAL LOW (ref 3.5–5.0)
Alkaline Phosphatase: 234 U/L — ABNORMAL HIGH (ref 38–126)
Anion gap: 14 (ref 5–15)
BILIRUBIN TOTAL: 3.9 mg/dL — AB (ref 0.3–1.2)
BUN: 10 mg/dL (ref 6–20)
CHLORIDE: 96 mmol/L — AB (ref 101–111)
CO2: 23 mmol/L (ref 22–32)
Calcium: 9.3 mg/dL (ref 8.9–10.3)
Creatinine, Ser: 1.54 mg/dL — ABNORMAL HIGH (ref 0.61–1.24)
GFR, EST AFRICAN AMERICAN: 56 mL/min — AB (ref >60)
GFR, EST NON AFRICAN AMERICAN: 48 mL/min — AB (ref >60)
Glucose, Bld: 105 mg/dL — ABNORMAL HIGH (ref 65–99)
POTASSIUM: 4.2 mmol/L (ref 3.5–5.1)
Sodium: 133 mmol/L — ABNORMAL LOW (ref 135–145)
Total Protein: 7.4 g/dL (ref 6.5–8.1)

## 2017-08-11 LAB — DIFFERENTIAL
BASOS ABS: 0.1 10*3/uL (ref 0.0–0.1)
BASOS PCT: 0 %
EOS ABS: 0 10*3/uL (ref 0.0–0.7)
Eosinophils Relative: 0 %
Lymphocytes Relative: 16 %
Lymphs Abs: 1.9 10*3/uL (ref 0.7–4.0)
Monocytes Absolute: 1.2 10*3/uL — ABNORMAL HIGH (ref 0.1–1.0)
Monocytes Relative: 10 %
NEUTROS ABS: 8.4 10*3/uL — AB (ref 1.7–7.7)
NEUTROS PCT: 74 %

## 2017-08-11 LAB — CBC
HEMATOCRIT: 27.6 % — AB (ref 39.0–52.0)
Hemoglobin: 9 g/dL — ABNORMAL LOW (ref 13.0–17.0)
MCH: 28.5 pg (ref 26.0–34.0)
MCHC: 32.6 g/dL (ref 30.0–36.0)
MCV: 87.3 fL (ref 78.0–100.0)
PLATELETS: 385 10*3/uL (ref 150–400)
RBC: 3.16 MIL/uL — AB (ref 4.22–5.81)
RDW: 15.7 % — AB (ref 11.5–15.5)
WBC: 11.4 10*3/uL — AB (ref 4.0–10.5)

## 2017-08-11 LAB — URINALYSIS, ROUTINE W REFLEX MICROSCOPIC
Bilirubin Urine: NEGATIVE
GLUCOSE, UA: NEGATIVE mg/dL
Hgb urine dipstick: NEGATIVE
KETONES UR: 5 mg/dL — AB
LEUKOCYTES UA: NEGATIVE
NITRITE: NEGATIVE
PH: 5 (ref 5.0–8.0)
PROTEIN: NEGATIVE mg/dL
Specific Gravity, Urine: 1.017 (ref 1.005–1.030)

## 2017-08-11 LAB — LIPASE, BLOOD: LIPASE: 20 U/L (ref 11–51)

## 2017-08-11 LAB — I-STAT CG4 LACTIC ACID, ED: Lactic Acid, Venous: 1.27 mmol/L (ref 0.5–1.9)

## 2017-08-11 MED ORDER — MORPHINE SULFATE (PF) 4 MG/ML IV SOLN
4.0000 mg | INTRAVENOUS | Status: DC | PRN
  Administered 2017-08-12: 4 mg via INTRAVENOUS
  Filled 2017-08-11: qty 1

## 2017-08-11 MED ORDER — PROMETHAZINE HCL 25 MG/ML IJ SOLN: 25.0000 mg | Freq: Three times a day (TID) | INTRAMUSCULAR | Status: DC | PRN

## 2017-08-11 MED ORDER — MORPHINE SULFATE (PF) 4 MG/ML IV SOLN
4.0000 mg | Freq: Once | INTRAVENOUS | Status: AC
  Administered 2017-08-11: 4 mg via INTRAVENOUS
  Filled 2017-08-11: qty 1

## 2017-08-11 MED ORDER — VANCOMYCIN HCL 10 G IV SOLR: 1500.0000 mg | INTRAVENOUS | Status: DC

## 2017-08-11 MED ORDER — VANCOMYCIN HCL IN DEXTROSE 1-5 GM/200ML-% IV SOLN
1000.0000 mg | Freq: Once | INTRAVENOUS | Status: AC
  Administered 2017-08-11: 1000 mg via INTRAVENOUS
  Filled 2017-08-11: qty 200

## 2017-08-11 MED ORDER — PIPERACILLIN-TAZOBACTAM 3.375 G IVPB 30 MIN
3.3750 g | Freq: Once | INTRAVENOUS | Status: AC
  Administered 2017-08-11: 3.375 g via INTRAVENOUS
  Filled 2017-08-11: qty 50

## 2017-08-11 MED ORDER — IOPAMIDOL (ISOVUE-300) INJECTION 61%
INTRAVENOUS | Status: AC
  Administered 2017-08-11: 100 mL
  Filled 2017-08-11: qty 100

## 2017-08-11 MED ORDER — PROMETHAZINE HCL 25 MG/ML IJ SOLN
25.0000 mg | Freq: Once | INTRAMUSCULAR | Status: AC
  Administered 2017-08-11: 25 mg via INTRAVENOUS
  Filled 2017-08-11: qty 1

## 2017-08-11 MED ORDER — SODIUM CHLORIDE 0.9 % IV BOLUS (SEPSIS)
1000.0000 mL | Freq: Once | INTRAVENOUS | Status: AC
Start: 2017-08-11 — End: 2017-08-11
  Administered 2017-08-11: 1000 mL via INTRAVENOUS

## 2017-08-11 MED ORDER — PIPERACILLIN-TAZOBACTAM 3.375 G IVPB: 3.3750 g | Freq: Three times a day (TID) | INTRAVENOUS | Status: DC

## 2017-08-11 MED ORDER — VANCOMYCIN HCL 10 G IV SOLR
1500.0000 mg | Freq: Once | INTRAVENOUS | Status: AC
  Administered 2017-08-11: 1500 mg via INTRAVENOUS
  Filled 2017-08-11: qty 1500

## 2017-08-11 NOTE — ED Notes (Signed)
Paged [facility] to [place], [place] about potential transfer

## 2017-08-11 NOTE — ED Provider Notes (Signed)
[place] EMERGENCY DEPARTMENT Provider Note   CSN: [id number] Arrival date & time: 08/11/17  1106     History   Chief Complaint Chief Complaint  Patient presents with  . Post-op Problem    HPI Danny Mason is a 57 y.o. male with a PMHx of anemia, chronic osteomyelitis of L foot, DM2, diverticulosis, HTN, HLD, hyponatremia, remote PE on xarelto, pAfib, and other conditions listed below, who presents to the ED with complaints of worsening N/V and poor PO intake x2-3 days.  Chart review reveals that pt was hospitalized at [facility] from 07/20/17-08/04/17 for pancreatic necrosis related to gallstones, underwent open cholecystectomy, pancreatic necrosectomy, and cystogastrotomy on 07/24/17; developed aspiration PNA in the hospital and was treated with vanc/zosyn; continued to improve and was discharged home on 08/04/17.  Ports that he was doing well until about 2-3 days ago when he started having nausea and vomiting.  He reports 2-3 episodes daily of nonbloody nonbilious emesis.  He was seen at [facility] yesterday and was given Zofran however this has not helped.  He states that he is not eating or drinking anything because of fear of vomiting, and feels generally weak and thinks he is becoming dehydrated.  He also reports that while waiting in the lobby he started having erythema and drainage to the lower portion of his incision and was having lower abdominal pain from the incision down into the lower belly.  He also mentions that this morning he had a fever of 100.8, has not had any antipyretics between taking his temperature and coming here.  He describes his abdominal pain is 4/10 constant aching nonradiating lower abdominal pain, with no known aggravating factors, and mildly improved with oxycodone 5 mg.  His PCP is Dr Venetia Maxon in [place].  Of note, he states he is "terrified" of [facility] and did not want to go there because of his "terrifying" experience there, which is why he came  here today.   He denies CP, SOB, diarrhea/constipation, obstipation, melena, hematochezia, hematemesis, hematuria, dysuria, myalgias, arthralgias, numbness, tingling, focal weakness, or any other complaints at this time.  He also denies any warmth to the incision.  Denies recent travel, sick contacts, suspicious food intake, EtOH use, or NSAID use.     The history is provided by the patient and medical records. No language interpreter was used.  Abdominal Pain   This is a new problem. The current episode started 3 to 5 hours ago. The problem occurs constantly. The problem has not changed since onset.The pain is associated with a previous surgery. The pain is located in the RLQ, periumbilical region, suprapubic region and LLQ. The quality of the pain is aching. The pain is at a severity of 4/10. The pain is mild. Associated symptoms include fever (100.8), nausea and vomiting. Pertinent negatives include diarrhea, flatus, hematochezia, melena, constipation, dysuria, hematuria, arthralgias and myalgias. Nothing aggravates the symptoms. Relieved by: oxycodone. Past workup includes surgery.    Past Medical History:  Diagnosis Date  . Abscess of left great toe 03/17/2016  . Anemia 04/02/2014  . Arthritis 06/09/2017  . Cellulitis and abscess of toe 05/05/2016  . Cellulitis of third toe, left 11/19/2016  . Chest pain 06/09/2017  . Chronic low back pain 12/19/2014  . Chronic osteomyelitis of left foot with draining sinus ([place]) 05/08/2016  . Chronic ulcer of great toe of right foot with fat layer exposed ([place]) 03/31/2014  . Class 2 obesity due to excess calories with serious comorbidity and  body mass index (BMI) of 36.0 to 36.9 in adult 04/02/2014  . DDD (degenerative disc disease), lumbar 06/14/2015  . Diabetes mellitus without complication ([place])   . Diabetic ulcer of toe of left foot associated with type 2 diabetes mellitus, with necrosis of bone ([place]) 03/14/2016  . Diverticulosis 06/09/2017  . DM type  2 with diabetic peripheral neuropathy ([place]) 01/21/2016  . Dorsalgia 04/07/2014   Overview:  10/1 IMO update  . Dyslipidemia 04/02/2014  . Erectile dysfunction due to arterial insufficiency 05/25/2015  . Foreign body in left foot with infection 01/21/2016  . Hammer toes of both feet 01/26/2017  . Hyperlipidemia 04/08/2016  . Hypertension   . Hyponatremia 11/22/2014  . Lower limb amputation, great toe ([place]) 01/26/2017  . Lumbar disc displacement without myelopathy 12/10/2016   Overview:  Added automatically from request for surgery [id number]  . Lumbar neuritis 12/10/2016   Overview:  Added automatically from request for surgery [id number]  . Lumbosacral radiculopathy 09/10/2015  . Osteomyelitis of toe of left foot ([place]) 01/16/2015  . Other toe(s) amputation status 01/26/2017  . Peripheral vascular disease ([place]) 04/08/2016  . Pulmonary embolism ([place]) 06/09/2017  . Right hip pain 04/07/2014  . Stenosis, spinal, lumbar 06/14/2015  . Type 2 diabetes mellitus with diabetic peripheral angiopathy without gangrene ([place]) 04/02/2014    Patient Active Problem List   Diagnosis Date Noted  . Paroxysmal atrial fibrillation ([place]) 06/22/2017  . CAD in native artery 06/11/2017  . Chronic anticoagulation 06/11/2017  . PAD (peripheral artery disease) ([place]) 06/11/2017  . Chest pain 06/09/2017  . Pulmonary embolism ([place]) 06/09/2017  . Diverticulosis 06/09/2017  . Arthritis 06/09/2017  . Hammer toes of both feet 01/26/2017  . Lower limb amputation, great toe ([place]) 01/26/2017  . Other toe(s) amputation status 01/26/2017  . Lumbar disc displacement without myelopathy 12/10/2016  . Lumbar neuritis 12/10/2016  . Cellulitis of third toe, left 11/19/2016  . Chronic osteomyelitis of left foot with draining sinus ([place]) 05/08/2016  . Cellulitis and abscess of toe 05/05/2016  . Hyperlipidemia 04/08/2016  . Peripheral vascular disease ([place]) 04/08/2016  . Abscess of left great toe 03/17/2016  . Diabetic ulcer of toe of left  foot associated with type 2 diabetes mellitus, with necrosis of bone ([place]) 03/14/2016  . Foreign body in left foot with infection 01/21/2016  . DM type 2 with diabetic peripheral neuropathy ([place]) 01/21/2016  . Lumbosacral radiculopathy 09/10/2015  . DDD (degenerative disc disease), lumbar 06/14/2015  . Stenosis, spinal, lumbar 06/14/2015  . Erectile dysfunction due to arterial insufficiency 05/25/2015  . Osteomyelitis of toe of left foot ([place]) 01/16/2015  . Chronic low back pain 12/19/2014  . Hyponatremia 11/22/2014  . Hypertension 04/27/2014  . Dorsalgia 04/07/2014  . Right hip pain 04/07/2014  . Type 2 diabetes mellitus with diabetic peripheral angiopathy without gangrene ([place]) 04/02/2014  . Anemia 04/02/2014  . Dyslipidemia 04/02/2014  . Class 2 obesity due to excess calories with serious comorbidity and body mass index (BMI) of 36.0 to 36.9 in adult 04/02/2014  . Chronic ulcer of great toe of right foot with fat layer exposed ([place]) 03/31/2014    Past Surgical History:  Procedure Laterality Date  . CARDIAC CATHETERIZATION     10 years ago  . KNEE CARTILAGE SURGERY Bilateral   . TOE AMPUTATION Left    4 toes       Home Medications    Prior to Admission medications   Medication Sig Start Date End Date Taking? Authorizing Provider  atorvastatin (LIPITOR)  10 MG tablet Take 10 mg by mouth.    [provider]  clindamycin (CLEOCIN) 300 MG capsule Take 1 capsule (300 mg total) by mouth 3 (three) times daily. 05/22/14   Harriet Masson, DPM  gabapentin (NEURONTIN) 300 MG capsule Take 300 mg by mouth.    [provider]  insulin detemir (LEVEMIR) 100 UNIT/ML injection Inject 45 Units into the skin.    [provider]  lisinopril-hydrochlorothiazide (PRINZIDE,ZESTORETIC) 10-12.5 MG per tablet Take 1 tablet by mouth.    [provider]  meloxicam (MOBIC) 15 MG tablet Take 15 mg by mouth.    [provider]  metFORMIN (GLUMETZA) 1000 MG  (MOD) 24 hr tablet Take 1,000 mg by mouth.    [provider]  methocarbamol (ROBAXIN) 500 MG tablet Take 500 mg by mouth. 04/07/14   [provider]  silver sulfADIAZINE (SILVADENE) 1 % cream Apply 1 application topically daily. 05/22/14   Harriet Masson, DPM    Family History Family History  Problem Relation Age of Onset  . Cancer Sister     Social History Social History   Tobacco Use  . Smoking status: Never Smoker  Substance Use Topics  . Alcohol use: No    Alcohol/week: 0.0 oz  . Drug use: No     Allergies   Patient has no known allergies.   Review of Systems Review of Systems  Constitutional: Positive for appetite change, fatigue (generalized weakness) and fever (100.8). Negative for chills.  Respiratory: Negative for shortness of breath.   Cardiovascular: Negative for chest pain.  Gastrointestinal: Positive for abdominal pain, nausea and vomiting. Negative for blood in stool, constipation, diarrhea, flatus, hematochezia and melena.  Genitourinary: Negative for dysuria and hematuria.  Musculoskeletal: Negative for arthralgias and myalgias.  Skin: Positive for color change and wound.  Allergic/Immunologic: Positive for immunocompromised state (DM2).  Neurological: Negative for weakness and numbness.  Hematological: Bruises/bleeds easily (on xarelto).  Psychiatric/Behavioral: Negative for confusion.   All other systems reviewed and are negative for acute change except as noted in the HPI.    Physical Exam Updated Vital Signs BP 116/62   Pulse 90   Temp 99 F (37.2 C) (Oral)   Resp 17   SpO2 97%   Physical Exam  Constitutional: He is oriented to person, place, and time. Vital signs are normal. He appears well-developed.  Non-toxic appearance. No distress.  Afebrile, somewhat unwell appearing but not septic appearing and nontoxic, in NAD. Appears pale and jaundiced.   HENT:  Head: Normocephalic and atraumatic.  Mouth/Throat: Oropharynx is  clear and moist. Mucous membranes are dry.  Dry lips  Eyes: Conjunctivae and EOM are normal. Right eye exhibits no discharge. Left eye exhibits no discharge. Scleral icterus is present.  Slight scleral icterus  Neck: Normal range of motion. Neck supple.  Cardiovascular: Normal rate, regular rhythm, normal heart sounds and intact distal pulses. Exam reveals no gallop and no friction rub.  No murmur heard. Pulmonary/Chest: Effort normal and breath sounds normal. No respiratory distress. He has no decreased breath sounds. He has no wheezes. He has no rhonchi. He has no rales.  Abdominal: Soft. Normal appearance and bowel sounds are normal. He exhibits no distension. There is tenderness in the right upper quadrant, right lower quadrant, periumbilical area, suprapubic area and left lower quadrant. There is no rigidity, no rebound, no guarding, no CVA tenderness, no tenderness at McBurney's point and negative Murphy's sign.  Midline incision with scabbing, some slight erythema towards the inferior  edges of the wound, with scant amount of purulent drainage from this area of the wound. SEE PICTURE BELOW Abdomen soft, obese but nondistended, +BS throughout, with mild lower abd TTP across lower abdomen and up to the inferior edge of the wound, and in RUQ, no r/g/r, neg murphy's, neg mcburney's, no CVA TTP   Musculoskeletal: Normal range of motion.  Neurological: He is alert and oriented to person, place, and time. He has normal strength. No sensory deficit.  Skin: Skin is warm, dry and intact. No rash noted. There is erythema. There is pallor.  Abdominal wound as mentioned above and pictured below Pale and jaundiced  Psychiatric: He has a normal mood and affect.  Nursing note and vitals reviewed.      ED Treatments / Results  Labs (all labs ordered are listed, but only abnormal results are displayed) Labs Reviewed  COMPREHENSIVE METABOLIC PANEL - Abnormal; Notable for the following components:       Result Value   Sodium 133 (*)    Chloride 96 (*)    Glucose, Bld 105 (*)    Creatinine, Ser 1.54 (*)    Albumin 2.6 (*)    AST 186 (*)    ALT 106 (*)    Alkaline Phosphatase 234 (*)    Total Bilirubin 3.9 (*)    GFR calc non Af Amer 48 (*)    GFR calc Af Amer 56 (*)    All other components within normal limits  CBC - Abnormal; Notable for the following components:   WBC 11.4 (*)    RBC 3.16 (*)    Hemoglobin 9.0 (*)    HCT 27.6 (*)    RDW 15.7 (*)    All other components within normal limits  URINALYSIS, ROUTINE W REFLEX MICROSCOPIC - Abnormal; Notable for the following components:   Ketones, ur 5 (*)    All other components within normal limits  DIFFERENTIAL - Abnormal; Notable for the following components:   Neutro Abs 8.4 (*)    Monocytes Absolute 1.2 (*)    All other components within normal limits  LIPASE, BLOOD  I-STAT CG4 LACTIC ACID, ED    EKG  EKG Interpretation None       Radiology Ct Abdomen Pelvis W Contrast  Result Date: 08/11/2017 CLINICAL DATA:  Abdominal pain with nausea and vomiting By report, recent pancreatic necrosectomy and cholecystectomy. EXAM: CT ABDOMEN AND PELVIS WITH CONTRAST TECHNIQUE: Multidetector CT imaging of the abdomen and pelvis was performed using the standard protocol following bolus administration of intravenous contrast. CONTRAST:  <See Chart> ISOVUE-300 IOPAMIDOL (ISOVUE-300) INJECTION 61% COMPARISON:  None. FINDINGS: Lower chest: There is bibasilar atelectatic change. There are multiple foci of coronary artery calcification. Hepatobiliary: No focal liver lesions are appreciable. Gallbladder is absent. There is moderate intrahepatic biliary duct dilatation. There is mild dilatation of the common bile duct 12 mm. No biliary duct mass or calculus evident. Pancreas: There is air within the head and body of the pancreas consistent with necrosis. There is loss of enhancement throughout most of the head and body of the pancreas with diffuse  decreased attenuation in this area. There is edema in the head and uncinate process of the pancreas with multiple areas of decreased attenuation, felt to represent pancreatic necrosis with surrounding acutely inflamed pancreatic tissue. Much of the tail the pancreas appears normal. There is moderate stranding in the region of the pancreatic head and body as well as posteriorly adjacent to the uncinate process. There is no significant  peripancreatic fluid. Spleen: Spleen measures 15.1 x 14.2 x 8.0 cm with a measured splenic volume of 858 cubic cm. No focal splenic lesions are evident. Adrenals/Urinary Tract: Adrenals bilaterally appear unremarkable. Kidneys bilaterally show no evident mass or hydronephrosis on either side. There is no renal or ureteral calculus on either side. Urinary bladder is midline with wall thickness within normal limits. Stomach/Bowel: There are multiple sigmoid diverticula without diverticulitis. There is mild thickening of the wall of the ascending colon due to proximity to inflamed pancreatic head. There is extensive thickening of the wall of the distal stomach and duodenum due to its proximity with the inflamed pancreas. Elsewhere there is no appreciable bowel wall thickening. There is no appreciable bowel obstruction. No free air or portal venous air. Vascular/Lymphatic: There are foci of atherosclerotic calcification in the aorta and common iliac arteries. No aneurysm. Major mesenteric vessels appear patent. There are scattered peripancreatic lymph nodes likely due to the inflammation in this area. No frank adenopathy is evident size criteria in the abdomen or pelvis. Reproductive: Prostate and seminal vesicles appear normal in size and contour. No pelvic mass. Other: There is no periappendiceal region inflammation. No ascites is evident in the abdomen or pelvis. There is no abscess in the abdomen or pelvis beyond the areas of pancreatic necrosis and inflammation. There is soft tissue  stranding in the anterior abdominal wall consistent with recent surgery. No fluid collection in this area. Musculoskeletal: There is degenerative change in the lower thoracic and lumbar spine regions. There are no blastic or lytic bone lesions. No intramuscular or abdominal wall lesions evident. IMPRESSION: 1. Extensive acute pancreatitis with extensive pancreatic edema throughout the head and uncinate process regions. There are intermittent areas of pancreatic necrosis in these areas. More severe pancreatic necrosis is seen throughout the body of the pancreas and in the region of the head of the pancreas near the junction with the body. There are foci of air in these areas. There is also lack of enhancement throughout most of the body of the pancreas. There is intermittent enhancement in the inflamed head and uncinate process regions. Much of the pancreatic tail appears essentially normal. There is soft tissue/mesenteric stranding surrounding the of pancreas in the head and uncinate process regions. 2. Extensive inflammation involving the distal stomach and most of the duodenum due to proximity to the pancreatic inflammation. There is also wall thickening in a portion of the ascending colon near the inflamed duodenum and head of pancreas. 3. Gallbladder absent. There is intrahepatic and extrahepatic biliary duct dilatation. No mass or calculus evident the biliary ductal system. The biliary ductal system is probably exacerbated by the pancreatitis. 4.  Splenomegaly without focal splenic lesion. 5.  No bowel obstruction. 6.  No renal or ureteral calculus.  No hydronephrosis. 7.  Aortoiliac atherosclerosis. 8. Postoperative change with stranding in the midportion anterior abdominal wall. Aortic Atherosclerosis (ICD10-I70.0). Electronically Signed   By: Lowella Grip III M.D.   On: 08/11/2017 19:13    Procedures Procedures (including critical care time)  Medications Ordered in ED Medications  vancomycin  (VANCOCIN) 1,500 mg in sodium chloride 0.9 % 500 mL IVPB (not administered)  morphine 4 MG/ML injection 4 mg (not administered)  promethazine (PHENERGAN) injection 25 mg (not administered)  morphine 4 MG/ML injection 4 mg (4 mg Intravenous Given 08/11/17 1748)  promethazine (PHENERGAN) injection 25 mg (25 mg Intravenous Given 08/11/17 1748)  sodium chloride 0.9 % bolus 1,000 mL (0 mLs Intravenous Stopped 08/11/17 2000)  iopamidol (ISOVUE-300)  61 % injection (100 mLs  Contrast Given 08/11/17 1827)  piperacillin-tazobactam (ZOSYN) IVPB 3.375 g (0 g Intravenous Stopped 08/11/17 2103)  vancomycin (VANCOCIN) IVPB 1000 mg/200 mL premix (1,000 mg Intravenous New Bag/Given 08/11/17 2002)  morphine 4 MG/ML injection 4 mg (4 mg Intravenous Given 08/11/17 2002)     Initial Impression / Assessment and Plan / ED Course  I have reviewed the triage vital signs and the nursing notes.  Pertinent labs & imaging results that were available during my care of the patient were reviewed by me and considered in my medical decision making (see chart for details).  Clinical Course as of Aug 11 1920  Tue Aug 11, 2042  414 57 year old male with recent cholecystectomy along with pancreatic question abscess repair here with persistent abdominal pain since surgery.  He now has rising LFTs.  Got diffuse upper abdominal pain.  He is otherwise nontoxic-appearing.  We are proceeding with CT imaging and will need to talk to GI regarding admission.  [MB]    Clinical Course User Index [MB] Hayden Rasmussen, MD    57 y.o. male here with n/v x2-3 days after recent cholecystectomy and pancreatic necrosectomy and cystogastrotomy at [facility] on 07/24/17. Was discharged from there on 08/04/17 after lengthy stay (had aspiration PNA and was treated with vanc/zosyn, doesn't look like any abx were rx'd at discharge). Reports today he developed redness and drainage to lower portion of his incision, and lower abd pain, as well as fever 100.8 (although  afebrile once he got here, and no antipyretics were used). On exam, appears pale and jaundiced, dry lips, VSS, afebrile, small amount of erythema to lower portion of incision and scant amount of purulent drainage to wound, mild lower abd TTP and RUQ TTP, neg murphy's sign, no flank tenderness. Work up thus far reveals: lactic WNL, lipase WNL, CMP with Na 133, Cl 96, Cr 1.54 (up from ~0.9 at [facility] upon discharge), AST 186/ALT 106 Alk phos 234 and TBili 3.9 (up from essentially unremarkable LFTs on last CMP before discharge from [facility]), CBC with WBC 11.4 and stable anemia. Will add-on differential, await U/A, and get CT abd/pelv. Will give pain meds, nausea meds, and fluids, then reassess. Anticipate he may need admission. Discussed case with my attending Dr. Melina Copa who agrees with plan.   7:28 PM Differential shows neutrophilic predominance to leukocytosis. CT abd/pelv showing extensive acute pancreatitis with edema around pancreatic head and uncinate process as well as intermittent pancreatic necrosis in those areas, more severe necrosis seen throughout body of pancreas and in head of pancreas with foci of air in those areas; and intermittent enhancement of inflamed head and uncinate process, as well as soft tissue/mesenteric stranding around pancreatic head and uncinate process; also extensive inflammation in distal stomach and most of duodenum due to proximity to pancreatic inflammation; also with intra and extra hepatic biliary duct dilation without mass/calculus and likely from pancreatitis; and postoperative change with stranding in midportion anterior abdominal wall. Overall, this appears consistent with clinical picture of persisting/acute pancreatitis with possible infection, and possible early incisional wound infection. Will start on vanc/zosyn and proceed with admission. U/A pending.   7:56 PM U/A pending. Dr. Posey Pronto of [facility] returning page and will admit assuming surgery service would be willing to  provide care while he's here as well. Discussed with Dr. Posey Pronto that pt adamantly refuses to go to [facility] therefore transfer is limited secondary to pt refusal; Dr. Posey Pronto understands and wants me to discuss with surgery service  and as long as they're willing to see pt while he's here then he'd be happy to admit the pt to his service. Will touch base with surgery team.   8:17 PM Dr. Georgette Dover  of surgery returning page and does not feel comfortable admitting pt here. Long discussion had with patient regarding importance of continuity of care there and the need for higher level of surgical care which would be had there, and pt finally agreeable to transfer. Secretary to page the PALS line to get transfer initiated.  8:55 PM Dr. Celine Ahr of [facility] surgical team returning page and states that currently the hospital and ER are full, and they have no bed to place pt in. They agreed that he should be transferred there, and have placed him on a waiting list for an ED bed in order to have ED-to-ED transfer. They discussed that without calling the CMO of the hospital, they cannot transfer him at this moment, until they have a bed available in the ED to accept him to. The staff there is aware of his condition and his results, and they stated that as soon as they have a bed they will call us and have him transfer, with Dr. Celine Ahr as the accepting physician. Will stand by for further instruction on bed status.   10:12 PM U/A unremarkable. PRN Pain and nausea meds ordered. Pt awaiting transfer to [facility] ED as soon as bed is available. ED secretary aware of pending transfer. Patient care to be resumed by Antonietta Breach, PA-C at shift change sign-out pending transfer. Patient history has been discussed with midlevel resuming care. Please see their notes for further documentation of pending dispo/care. Pt stable at sign-out and updated on transfer of care.    Final Clinical Impressions(s) / ED Diagnoses   Final diagnoses:  Abdominal  pain, unspecified abdominal location  Nausea and vomiting in adult patient  Transaminitis  Wound infection after surgery  Neutrophilic leukocytosis  Pancreatic necrosis  Acute pancreatitis, unspecified complication status, unspecified pancreatitis type  AKI (acute kidney injury) [facility])  Hyperbilirubinemia    ED Discharge Orders    [address], [place], [place] 08/11/17 [address] [place], [place] 08/12/17 0019    Hayden Rasmussen, MD 08/13/17 1144

## 2017-08-11 NOTE — ED Notes (Signed)
Karena Addison [phone] girlfriend

## 2017-08-11 NOTE — ED Triage Notes (Signed)
Pt reports being in [facility] recently after having gallbladder removed and pancreatitis. Pt was seen at [facility] ED yesterday for elevated liver enzymes and low blood sugar. Pt was discharged back home. Pt concearned because of n/v he has been unable to keep medications down for 3 days.

## 2017-08-11 NOTE — ED Notes (Signed)
Urinal provided for patient.

## 2017-08-11 NOTE — ED Notes (Signed)
Patient transported to CT 

## 2017-08-11 NOTE — Progress Notes (Addendum)
Pharmacy Antibiotic Note  Danny Mason is a 57 y.o. male with recent gallbladder removal and pancreatitis who presented to the [facility] on 08/11/2017 with complaints of worsening N/V and po intake. The patient also was noted to have erythema and drainage from his incision site concerning for infection. Pharmacy has been consulted for Vancomycin and Zosyn dosing to cover for possible wound infection. SCr 1.54, nCrCl~50-55 ml/min  Plan: 1. Vancomycin 2500 mg IV x 1 to load (given as 1g + 1500 mg) followed by 1500 mg IV every 24 hours 2. Zosyn 3.375g x 1 to load followed by 3.375g IV every 8 hours 3. Will continue to follow renal function, culture results, LOT, and antibiotic de-escalation plans   Height: 6\' 3"  (190.5 cm) Weight: 290 lb (131.5 kg) IBW/kg (Calculated) : 84.5  Temp (24hrs), Avg:99 F (37.2 C), Min:99 F (37.2 C), Max:99 F (37.2 C)  Recent Labs  Lab 08/11/17 1128 08/11/17 1216  WBC 11.4*  --   CREATININE 1.54*  --   LATICACIDVEN  --  1.27    Estimated Creatinine Clearance: 77.3 mL/min (A) (by C-G formula based on SCr of 1.54 mg/dL (H)).    No Known Allergies  Antimicrobials this admission: Vanc 2/19 >> Zosyn 2/19 >>  Dose adjustments this admission:   Microbiology results:  Thank you for allowing pharmacy to be a part of this patient's care.  Alycia Rossetti, PharmD, BCPS Clinical Pharmacist Pager: [phone] Clinical phone for 08/11/2017: [phone] 08/11/2017 8:37 PM

## 2017-08-12 DIAGNOSIS — I10 Essential (primary) hypertension: Secondary | ICD-10-CM | POA: Diagnosis not present

## 2017-08-12 DIAGNOSIS — Z794 Long term (current) use of insulin: Secondary | ICD-10-CM | POA: Diagnosis not present

## 2017-08-12 DIAGNOSIS — R17 Unspecified jaundice: Secondary | ICD-10-CM | POA: Diagnosis not present

## 2017-08-12 DIAGNOSIS — Z89412 Acquired absence of left great toe: Secondary | ICD-10-CM | POA: Diagnosis not present

## 2017-08-12 DIAGNOSIS — Z4682 Encounter for fitting and adjustment of non-vascular catheter: Secondary | ICD-10-CM | POA: Diagnosis not present

## 2017-08-12 DIAGNOSIS — Z79899 Other long term (current) drug therapy: Secondary | ICD-10-CM | POA: Diagnosis not present

## 2017-08-12 DIAGNOSIS — Z7901 Long term (current) use of anticoagulants: Secondary | ICD-10-CM | POA: Diagnosis not present

## 2017-08-12 DIAGNOSIS — R1110 Vomiting, unspecified: Secondary | ICD-10-CM | POA: Diagnosis not present

## 2017-08-12 DIAGNOSIS — E43 Unspecified severe protein-calorie malnutrition: Secondary | ICD-10-CM | POA: Diagnosis not present

## 2017-08-12 DIAGNOSIS — Z89422 Acquired absence of other left toe(s): Secondary | ICD-10-CM | POA: Diagnosis not present

## 2017-08-12 DIAGNOSIS — E119 Type 2 diabetes mellitus without complications: Secondary | ICD-10-CM | POA: Diagnosis not present

## 2017-08-12 DIAGNOSIS — I4891 Unspecified atrial fibrillation: Secondary | ICD-10-CM | POA: Diagnosis not present

## 2017-08-12 DIAGNOSIS — Z86711 Personal history of pulmonary embolism: Secondary | ICD-10-CM | POA: Diagnosis not present

## 2017-08-12 DIAGNOSIS — Z9049 Acquired absence of other specified parts of digestive tract: Secondary | ICD-10-CM | POA: Diagnosis not present

## 2017-08-12 DIAGNOSIS — K311 Adult hypertrophic pyloric stenosis: Secondary | ICD-10-CM | POA: Diagnosis not present

## 2017-08-12 DIAGNOSIS — R112 Nausea with vomiting, unspecified: Secondary | ICD-10-CM | POA: Diagnosis not present

## 2017-08-12 DIAGNOSIS — G4700 Insomnia, unspecified: Secondary | ICD-10-CM | POA: Diagnosis not present

## 2017-08-12 DIAGNOSIS — Z48815 Encounter for surgical aftercare following surgery on the digestive system: Secondary | ICD-10-CM | POA: Diagnosis not present

## 2017-08-12 DIAGNOSIS — K831 Obstruction of bile duct: Secondary | ICD-10-CM | POA: Diagnosis not present

## 2017-08-12 DIAGNOSIS — K8592 Acute pancreatitis with infected necrosis, unspecified: Secondary | ICD-10-CM | POA: Diagnosis not present

## 2017-08-12 DIAGNOSIS — R109 Unspecified abdominal pain: Secondary | ICD-10-CM | POA: Diagnosis not present

## 2017-08-12 DIAGNOSIS — K8591 Acute pancreatitis with uninfected necrosis, unspecified: Secondary | ICD-10-CM | POA: Diagnosis not present

## 2017-08-12 DIAGNOSIS — Z833 Family history of diabetes mellitus: Secondary | ICD-10-CM | POA: Diagnosis not present

## 2017-08-12 DIAGNOSIS — K219 Gastro-esophageal reflux disease without esophagitis: Secondary | ICD-10-CM | POA: Diagnosis not present

## 2017-08-12 DIAGNOSIS — T8140XA Infection following a procedure, unspecified, initial encounter: Secondary | ICD-10-CM | POA: Diagnosis not present

## 2017-08-12 DIAGNOSIS — R740 Nonspecific elevation of levels of transaminase and lactic acid dehydrogenase [LDH]: Secondary | ICD-10-CM | POA: Diagnosis not present

## 2017-08-12 DIAGNOSIS — D72829 Elevated white blood cell count, unspecified: Secondary | ICD-10-CM | POA: Diagnosis not present

## 2017-08-12 DIAGNOSIS — K3189 Other diseases of stomach and duodenum: Secondary | ICD-10-CM | POA: Diagnosis not present

## 2017-08-12 DIAGNOSIS — Z89421 Acquired absence of other right toe(s): Secondary | ICD-10-CM | POA: Diagnosis not present

## 2017-08-12 DIAGNOSIS — N179 Acute kidney failure, unspecified: Secondary | ICD-10-CM | POA: Diagnosis not present

## 2017-08-12 DIAGNOSIS — E1142 Type 2 diabetes mellitus with diabetic polyneuropathy: Secondary | ICD-10-CM | POA: Diagnosis not present

## 2017-08-12 DIAGNOSIS — K859 Acute pancreatitis without necrosis or infection, unspecified: Secondary | ICD-10-CM | POA: Diagnosis not present

## 2017-08-12 DIAGNOSIS — T8131XA Disruption of external operation (surgical) wound, not elsewhere classified, initial encounter: Secondary | ICD-10-CM | POA: Diagnosis not present

## 2017-08-12 DIAGNOSIS — E1165 Type 2 diabetes mellitus with hyperglycemia: Secondary | ICD-10-CM | POA: Diagnosis not present

## 2017-08-12 DIAGNOSIS — Z89411 Acquired absence of right great toe: Secondary | ICD-10-CM | POA: Diagnosis not present

## 2017-08-12 DIAGNOSIS — R1310 Dysphagia, unspecified: Secondary | ICD-10-CM | POA: Diagnosis not present

## 2017-08-12 DIAGNOSIS — K8511 Biliary acute pancreatitis with uninfected necrosis: Secondary | ICD-10-CM | POA: Diagnosis not present

## 2017-08-12 NOTE — ED Notes (Signed)
EMTALA reviewed by Charge RN Cecille Aver

## 2017-08-18 DIAGNOSIS — Z48815 Encounter for surgical aftercare following surgery on the digestive system: Secondary | ICD-10-CM | POA: Diagnosis not present

## 2017-08-27 DIAGNOSIS — I2510 Atherosclerotic heart disease of native coronary artery without angina pectoris: Secondary | ICD-10-CM | POA: Diagnosis not present

## 2017-08-27 DIAGNOSIS — Z794 Long term (current) use of insulin: Secondary | ICD-10-CM | POA: Diagnosis not present

## 2017-08-27 DIAGNOSIS — Z48815 Encounter for surgical aftercare following surgery on the digestive system: Secondary | ICD-10-CM | POA: Diagnosis not present

## 2017-08-27 DIAGNOSIS — I110 Hypertensive heart disease with heart failure: Secondary | ICD-10-CM | POA: Diagnosis not present

## 2017-08-27 DIAGNOSIS — I509 Heart failure, unspecified: Secondary | ICD-10-CM | POA: Diagnosis not present

## 2017-08-27 DIAGNOSIS — M1991 Primary osteoarthritis, unspecified site: Secondary | ICD-10-CM | POA: Diagnosis not present

## 2017-08-27 DIAGNOSIS — E1142 Type 2 diabetes mellitus with diabetic polyneuropathy: Secondary | ICD-10-CM | POA: Diagnosis not present

## 2017-08-27 DIAGNOSIS — E1151 Type 2 diabetes mellitus with diabetic peripheral angiopathy without gangrene: Secondary | ICD-10-CM | POA: Diagnosis not present

## 2017-08-27 DIAGNOSIS — Z5181 Encounter for therapeutic drug level monitoring: Secondary | ICD-10-CM | POA: Diagnosis not present

## 2017-08-27 DIAGNOSIS — Z8631 Personal history of diabetic foot ulcer: Secondary | ICD-10-CM | POA: Diagnosis not present

## 2017-08-27 DIAGNOSIS — I480 Paroxysmal atrial fibrillation: Secondary | ICD-10-CM | POA: Diagnosis not present

## 2017-08-27 DIAGNOSIS — Z7901 Long term (current) use of anticoagulants: Secondary | ICD-10-CM | POA: Diagnosis not present

## 2017-08-27 DIAGNOSIS — Z931 Gastrostomy status: Secondary | ICD-10-CM | POA: Diagnosis not present

## 2017-08-27 DIAGNOSIS — Z86711 Personal history of pulmonary embolism: Secondary | ICD-10-CM | POA: Diagnosis not present

## 2017-08-27 DIAGNOSIS — R531 Weakness: Secondary | ICD-10-CM | POA: Diagnosis not present

## 2017-08-28 DIAGNOSIS — Z8631 Personal history of diabetic foot ulcer: Secondary | ICD-10-CM | POA: Diagnosis not present

## 2017-08-28 DIAGNOSIS — E1151 Type 2 diabetes mellitus with diabetic peripheral angiopathy without gangrene: Secondary | ICD-10-CM | POA: Diagnosis not present

## 2017-08-28 DIAGNOSIS — Z794 Long term (current) use of insulin: Secondary | ICD-10-CM | POA: Diagnosis not present

## 2017-08-28 DIAGNOSIS — R531 Weakness: Secondary | ICD-10-CM | POA: Diagnosis not present

## 2017-08-28 DIAGNOSIS — Z5181 Encounter for therapeutic drug level monitoring: Secondary | ICD-10-CM | POA: Diagnosis not present

## 2017-08-28 DIAGNOSIS — I509 Heart failure, unspecified: Secondary | ICD-10-CM | POA: Diagnosis not present

## 2017-08-28 DIAGNOSIS — Z931 Gastrostomy status: Secondary | ICD-10-CM | POA: Diagnosis not present

## 2017-08-28 DIAGNOSIS — M1991 Primary osteoarthritis, unspecified site: Secondary | ICD-10-CM | POA: Diagnosis not present

## 2017-08-28 DIAGNOSIS — Z48815 Encounter for surgical aftercare following surgery on the digestive system: Secondary | ICD-10-CM | POA: Diagnosis not present

## 2017-08-28 DIAGNOSIS — I480 Paroxysmal atrial fibrillation: Secondary | ICD-10-CM | POA: Diagnosis not present

## 2017-08-28 DIAGNOSIS — I2510 Atherosclerotic heart disease of native coronary artery without angina pectoris: Secondary | ICD-10-CM | POA: Diagnosis not present

## 2017-08-28 DIAGNOSIS — Z7901 Long term (current) use of anticoagulants: Secondary | ICD-10-CM | POA: Diagnosis not present

## 2017-08-28 DIAGNOSIS — E1142 Type 2 diabetes mellitus with diabetic polyneuropathy: Secondary | ICD-10-CM | POA: Diagnosis not present

## 2017-08-28 DIAGNOSIS — Z86711 Personal history of pulmonary embolism: Secondary | ICD-10-CM | POA: Diagnosis not present

## 2017-08-28 DIAGNOSIS — I110 Hypertensive heart disease with heart failure: Secondary | ICD-10-CM | POA: Diagnosis not present

## 2017-08-29 DIAGNOSIS — I10 Essential (primary) hypertension: Secondary | ICD-10-CM | POA: Diagnosis not present

## 2017-08-29 DIAGNOSIS — R031 Nonspecific low blood-pressure reading: Secondary | ICD-10-CM | POA: Diagnosis not present

## 2017-08-29 DIAGNOSIS — Z8679 Personal history of other diseases of the circulatory system: Secondary | ICD-10-CM | POA: Diagnosis not present

## 2017-08-29 DIAGNOSIS — Z7984 Long term (current) use of oral hypoglycemic drugs: Secondary | ICD-10-CM | POA: Diagnosis not present

## 2017-08-29 DIAGNOSIS — E785 Hyperlipidemia, unspecified: Secondary | ICD-10-CM | POA: Diagnosis not present

## 2017-08-29 DIAGNOSIS — K859 Acute pancreatitis without necrosis or infection, unspecified: Secondary | ICD-10-CM | POA: Diagnosis not present

## 2017-08-29 DIAGNOSIS — Z79899 Other long term (current) drug therapy: Secondary | ICD-10-CM | POA: Diagnosis not present

## 2017-08-29 DIAGNOSIS — Z9889 Other specified postprocedural states: Secondary | ICD-10-CM | POA: Diagnosis not present

## 2017-08-29 DIAGNOSIS — Z7901 Long term (current) use of anticoagulants: Secondary | ICD-10-CM | POA: Diagnosis not present

## 2017-08-29 DIAGNOSIS — R633 Feeding difficulties: Secondary | ICD-10-CM | POA: Diagnosis not present

## 2017-08-29 DIAGNOSIS — Z8639 Personal history of other endocrine, nutritional and metabolic disease: Secondary | ICD-10-CM | POA: Diagnosis not present

## 2017-08-29 DIAGNOSIS — K8592 Acute pancreatitis with infected necrosis, unspecified: Secondary | ICD-10-CM | POA: Diagnosis not present

## 2017-08-29 DIAGNOSIS — Z86711 Personal history of pulmonary embolism: Secondary | ICD-10-CM | POA: Diagnosis not present

## 2017-08-29 DIAGNOSIS — Z794 Long term (current) use of insulin: Secondary | ICD-10-CM | POA: Diagnosis not present

## 2017-08-29 DIAGNOSIS — R262 Difficulty in walking, not elsewhere classified: Secondary | ICD-10-CM | POA: Diagnosis not present

## 2017-08-29 DIAGNOSIS — Z9049 Acquired absence of other specified parts of digestive tract: Secondary | ICD-10-CM | POA: Diagnosis not present

## 2017-08-29 DIAGNOSIS — G8929 Other chronic pain: Secondary | ICD-10-CM | POA: Diagnosis not present

## 2017-08-29 DIAGNOSIS — E43 Unspecified severe protein-calorie malnutrition: Secondary | ICD-10-CM | POA: Diagnosis not present

## 2017-08-29 DIAGNOSIS — Z8719 Personal history of other diseases of the digestive system: Secondary | ICD-10-CM | POA: Diagnosis not present

## 2017-08-29 DIAGNOSIS — K9423 Gastrostomy malfunction: Secondary | ICD-10-CM | POA: Diagnosis not present

## 2017-08-29 DIAGNOSIS — K8591 Acute pancreatitis with uninfected necrosis, unspecified: Secondary | ICD-10-CM | POA: Diagnosis not present

## 2017-08-29 DIAGNOSIS — K311 Adult hypertrophic pyloric stenosis: Secondary | ICD-10-CM | POA: Diagnosis not present

## 2017-08-29 DIAGNOSIS — E1165 Type 2 diabetes mellitus with hyperglycemia: Secondary | ICD-10-CM | POA: Diagnosis not present

## 2017-08-29 DIAGNOSIS — R1084 Generalized abdominal pain: Secondary | ICD-10-CM | POA: Diagnosis not present

## 2017-08-29 DIAGNOSIS — E119 Type 2 diabetes mellitus without complications: Secondary | ICD-10-CM | POA: Diagnosis not present

## 2017-08-30 DIAGNOSIS — Z86711 Personal history of pulmonary embolism: Secondary | ICD-10-CM | POA: Diagnosis not present

## 2017-08-30 DIAGNOSIS — Z8719 Personal history of other diseases of the digestive system: Secondary | ICD-10-CM | POA: Diagnosis not present

## 2017-08-30 DIAGNOSIS — Z9889 Other specified postprocedural states: Secondary | ICD-10-CM | POA: Diagnosis not present

## 2017-08-30 DIAGNOSIS — Z8639 Personal history of other endocrine, nutritional and metabolic disease: Secondary | ICD-10-CM | POA: Diagnosis not present

## 2017-08-30 DIAGNOSIS — Z9049 Acquired absence of other specified parts of digestive tract: Secondary | ICD-10-CM | POA: Diagnosis not present

## 2017-08-30 DIAGNOSIS — Z8679 Personal history of other diseases of the circulatory system: Secondary | ICD-10-CM | POA: Diagnosis not present

## 2017-08-30 DIAGNOSIS — Z7901 Long term (current) use of anticoagulants: Secondary | ICD-10-CM | POA: Diagnosis not present

## 2017-08-30 DIAGNOSIS — K9423 Gastrostomy malfunction: Secondary | ICD-10-CM | POA: Diagnosis not present

## 2017-08-31 DIAGNOSIS — R262 Difficulty in walking, not elsewhere classified: Secondary | ICD-10-CM | POA: Diagnosis not present

## 2017-09-04 DIAGNOSIS — I10 Essential (primary) hypertension: Secondary | ICD-10-CM | POA: Diagnosis not present

## 2017-09-04 DIAGNOSIS — G8929 Other chronic pain: Secondary | ICD-10-CM | POA: Diagnosis not present

## 2017-09-04 DIAGNOSIS — E119 Type 2 diabetes mellitus without complications: Secondary | ICD-10-CM | POA: Diagnosis not present

## 2017-09-04 DIAGNOSIS — Z7984 Long term (current) use of oral hypoglycemic drugs: Secondary | ICD-10-CM | POA: Diagnosis not present

## 2017-09-04 DIAGNOSIS — Z79899 Other long term (current) drug therapy: Secondary | ICD-10-CM | POA: Diagnosis not present

## 2017-09-04 DIAGNOSIS — K9423 Gastrostomy malfunction: Secondary | ICD-10-CM | POA: Diagnosis not present

## 2017-09-04 DIAGNOSIS — Z794 Long term (current) use of insulin: Secondary | ICD-10-CM | POA: Diagnosis not present

## 2017-09-05 DIAGNOSIS — R031 Nonspecific low blood-pressure reading: Secondary | ICD-10-CM | POA: Diagnosis not present

## 2017-09-05 DIAGNOSIS — K9423 Gastrostomy malfunction: Secondary | ICD-10-CM | POA: Diagnosis not present

## 2017-09-17 DIAGNOSIS — E119 Type 2 diabetes mellitus without complications: Secondary | ICD-10-CM | POA: Diagnosis not present

## 2017-09-17 DIAGNOSIS — I6789 Other cerebrovascular disease: Secondary | ICD-10-CM | POA: Diagnosis not present

## 2017-09-17 DIAGNOSIS — E785 Hyperlipidemia, unspecified: Secondary | ICD-10-CM | POA: Diagnosis not present

## 2017-09-17 DIAGNOSIS — I10 Essential (primary) hypertension: Secondary | ICD-10-CM | POA: Diagnosis not present

## 2017-09-17 DIAGNOSIS — G9341 Metabolic encephalopathy: Secondary | ICD-10-CM | POA: Diagnosis not present

## 2017-09-17 DIAGNOSIS — Z8719 Personal history of other diseases of the digestive system: Secondary | ICD-10-CM | POA: Diagnosis not present

## 2017-09-17 DIAGNOSIS — I129 Hypertensive chronic kidney disease with stage 1 through stage 4 chronic kidney disease, or unspecified chronic kidney disease: Secondary | ICD-10-CM | POA: Diagnosis not present

## 2017-09-17 DIAGNOSIS — E7800 Pure hypercholesterolemia, unspecified: Secondary | ICD-10-CM | POA: Diagnosis not present

## 2017-09-17 DIAGNOSIS — E1165 Type 2 diabetes mellitus with hyperglycemia: Secondary | ICD-10-CM | POA: Diagnosis not present

## 2017-09-17 DIAGNOSIS — I517 Cardiomegaly: Secondary | ICD-10-CM | POA: Diagnosis not present

## 2017-09-17 DIAGNOSIS — R531 Weakness: Secondary | ICD-10-CM | POA: Diagnosis not present

## 2017-09-17 DIAGNOSIS — R404 Transient alteration of awareness: Secondary | ICD-10-CM | POA: Diagnosis not present

## 2017-09-17 DIAGNOSIS — E860 Dehydration: Secondary | ICD-10-CM | POA: Diagnosis not present

## 2017-09-17 DIAGNOSIS — E1122 Type 2 diabetes mellitus with diabetic chronic kidney disease: Secondary | ICD-10-CM | POA: Diagnosis not present

## 2017-09-17 DIAGNOSIS — D649 Anemia, unspecified: Secondary | ICD-10-CM | POA: Diagnosis not present

## 2017-09-17 DIAGNOSIS — R262 Difficulty in walking, not elsewhere classified: Secondary | ICD-10-CM | POA: Diagnosis not present

## 2017-09-17 DIAGNOSIS — N183 Chronic kidney disease, stage 3 (moderate): Secondary | ICD-10-CM | POA: Diagnosis not present

## 2017-09-17 DIAGNOSIS — Z86711 Personal history of pulmonary embolism: Secondary | ICD-10-CM | POA: Diagnosis not present

## 2017-09-17 DIAGNOSIS — Z79899 Other long term (current) drug therapy: Secondary | ICD-10-CM | POA: Diagnosis not present

## 2017-09-18 DIAGNOSIS — I129 Hypertensive chronic kidney disease with stage 1 through stage 4 chronic kidney disease, or unspecified chronic kidney disease: Secondary | ICD-10-CM | POA: Diagnosis not present

## 2017-09-18 DIAGNOSIS — E1122 Type 2 diabetes mellitus with diabetic chronic kidney disease: Secondary | ICD-10-CM | POA: Diagnosis not present

## 2017-09-18 DIAGNOSIS — Z8719 Personal history of other diseases of the digestive system: Secondary | ICD-10-CM | POA: Diagnosis not present

## 2017-09-18 DIAGNOSIS — R531 Weakness: Secondary | ICD-10-CM | POA: Diagnosis not present

## 2017-09-18 DIAGNOSIS — Z79899 Other long term (current) drug therapy: Secondary | ICD-10-CM | POA: Diagnosis not present

## 2017-09-18 DIAGNOSIS — R262 Difficulty in walking, not elsewhere classified: Secondary | ICD-10-CM | POA: Diagnosis not present

## 2017-09-18 DIAGNOSIS — E1165 Type 2 diabetes mellitus with hyperglycemia: Secondary | ICD-10-CM | POA: Diagnosis not present

## 2017-09-18 DIAGNOSIS — N183 Chronic kidney disease, stage 3 (moderate): Secondary | ICD-10-CM | POA: Diagnosis not present

## 2017-09-18 DIAGNOSIS — E785 Hyperlipidemia, unspecified: Secondary | ICD-10-CM | POA: Diagnosis not present

## 2017-09-19 DIAGNOSIS — M1991 Primary osteoarthritis, unspecified site: Secondary | ICD-10-CM | POA: Diagnosis not present

## 2017-09-19 DIAGNOSIS — I509 Heart failure, unspecified: Secondary | ICD-10-CM | POA: Diagnosis not present

## 2017-09-19 DIAGNOSIS — E785 Hyperlipidemia, unspecified: Secondary | ICD-10-CM | POA: Diagnosis not present

## 2017-09-19 DIAGNOSIS — I110 Hypertensive heart disease with heart failure: Secondary | ICD-10-CM | POA: Diagnosis not present

## 2017-09-19 DIAGNOSIS — R262 Difficulty in walking, not elsewhere classified: Secondary | ICD-10-CM | POA: Diagnosis not present

## 2017-09-19 DIAGNOSIS — I480 Paroxysmal atrial fibrillation: Secondary | ICD-10-CM | POA: Diagnosis not present

## 2017-09-19 DIAGNOSIS — N183 Chronic kidney disease, stage 3 (moderate): Secondary | ICD-10-CM | POA: Diagnosis not present

## 2017-09-19 DIAGNOSIS — Z7901 Long term (current) use of anticoagulants: Secondary | ICD-10-CM | POA: Diagnosis not present

## 2017-09-19 DIAGNOSIS — E1142 Type 2 diabetes mellitus with diabetic polyneuropathy: Secondary | ICD-10-CM | POA: Diagnosis not present

## 2017-09-19 DIAGNOSIS — Z86711 Personal history of pulmonary embolism: Secondary | ICD-10-CM | POA: Diagnosis not present

## 2017-09-19 DIAGNOSIS — Z931 Gastrostomy status: Secondary | ICD-10-CM | POA: Diagnosis not present

## 2017-09-19 DIAGNOSIS — E1122 Type 2 diabetes mellitus with diabetic chronic kidney disease: Secondary | ICD-10-CM | POA: Diagnosis not present

## 2017-09-19 DIAGNOSIS — R531 Weakness: Secondary | ICD-10-CM | POA: Diagnosis not present

## 2017-09-19 DIAGNOSIS — E1165 Type 2 diabetes mellitus with hyperglycemia: Secondary | ICD-10-CM | POA: Diagnosis not present

## 2017-09-19 DIAGNOSIS — Z8631 Personal history of diabetic foot ulcer: Secondary | ICD-10-CM | POA: Diagnosis not present

## 2017-09-19 DIAGNOSIS — E1151 Type 2 diabetes mellitus with diabetic peripheral angiopathy without gangrene: Secondary | ICD-10-CM | POA: Diagnosis not present

## 2017-09-19 DIAGNOSIS — I129 Hypertensive chronic kidney disease with stage 1 through stage 4 chronic kidney disease, or unspecified chronic kidney disease: Secondary | ICD-10-CM | POA: Diagnosis not present

## 2017-09-19 DIAGNOSIS — Z48815 Encounter for surgical aftercare following surgery on the digestive system: Secondary | ICD-10-CM | POA: Diagnosis not present

## 2017-09-19 DIAGNOSIS — Z79899 Other long term (current) drug therapy: Secondary | ICD-10-CM | POA: Diagnosis not present

## 2017-09-19 DIAGNOSIS — I2510 Atherosclerotic heart disease of native coronary artery without angina pectoris: Secondary | ICD-10-CM | POA: Diagnosis not present

## 2017-09-19 DIAGNOSIS — Z8719 Personal history of other diseases of the digestive system: Secondary | ICD-10-CM | POA: Diagnosis not present

## 2017-09-19 DIAGNOSIS — Z5181 Encounter for therapeutic drug level monitoring: Secondary | ICD-10-CM | POA: Diagnosis not present

## 2017-09-19 DIAGNOSIS — Z794 Long term (current) use of insulin: Secondary | ICD-10-CM | POA: Diagnosis not present

## 2017-09-22 DIAGNOSIS — E1151 Type 2 diabetes mellitus with diabetic peripheral angiopathy without gangrene: Secondary | ICD-10-CM | POA: Diagnosis not present

## 2017-09-22 DIAGNOSIS — I110 Hypertensive heart disease with heart failure: Secondary | ICD-10-CM | POA: Diagnosis not present

## 2017-09-22 DIAGNOSIS — Z931 Gastrostomy status: Secondary | ICD-10-CM | POA: Diagnosis not present

## 2017-09-22 DIAGNOSIS — Z794 Long term (current) use of insulin: Secondary | ICD-10-CM | POA: Diagnosis not present

## 2017-09-22 DIAGNOSIS — Z7901 Long term (current) use of anticoagulants: Secondary | ICD-10-CM | POA: Diagnosis not present

## 2017-09-22 DIAGNOSIS — I2510 Atherosclerotic heart disease of native coronary artery without angina pectoris: Secondary | ICD-10-CM | POA: Diagnosis not present

## 2017-09-22 DIAGNOSIS — R531 Weakness: Secondary | ICD-10-CM | POA: Diagnosis not present

## 2017-09-22 DIAGNOSIS — I480 Paroxysmal atrial fibrillation: Secondary | ICD-10-CM | POA: Diagnosis not present

## 2017-09-22 DIAGNOSIS — I509 Heart failure, unspecified: Secondary | ICD-10-CM | POA: Diagnosis not present

## 2017-09-22 DIAGNOSIS — M1991 Primary osteoarthritis, unspecified site: Secondary | ICD-10-CM | POA: Diagnosis not present

## 2017-09-22 DIAGNOSIS — Z8631 Personal history of diabetic foot ulcer: Secondary | ICD-10-CM | POA: Diagnosis not present

## 2017-09-22 DIAGNOSIS — Z86711 Personal history of pulmonary embolism: Secondary | ICD-10-CM | POA: Diagnosis not present

## 2017-09-22 DIAGNOSIS — Z5181 Encounter for therapeutic drug level monitoring: Secondary | ICD-10-CM | POA: Diagnosis not present

## 2017-09-22 DIAGNOSIS — E1142 Type 2 diabetes mellitus with diabetic polyneuropathy: Secondary | ICD-10-CM | POA: Diagnosis not present

## 2017-09-22 DIAGNOSIS — Z48815 Encounter for surgical aftercare following surgery on the digestive system: Secondary | ICD-10-CM | POA: Diagnosis not present

## 2017-09-23 DIAGNOSIS — Z7901 Long term (current) use of anticoagulants: Secondary | ICD-10-CM | POA: Diagnosis not present

## 2017-09-23 DIAGNOSIS — I480 Paroxysmal atrial fibrillation: Secondary | ICD-10-CM | POA: Diagnosis not present

## 2017-09-23 DIAGNOSIS — I110 Hypertensive heart disease with heart failure: Secondary | ICD-10-CM | POA: Diagnosis not present

## 2017-09-23 DIAGNOSIS — Z8631 Personal history of diabetic foot ulcer: Secondary | ICD-10-CM | POA: Diagnosis not present

## 2017-09-23 DIAGNOSIS — E1142 Type 2 diabetes mellitus with diabetic polyneuropathy: Secondary | ICD-10-CM | POA: Diagnosis not present

## 2017-09-23 DIAGNOSIS — Z794 Long term (current) use of insulin: Secondary | ICD-10-CM | POA: Diagnosis not present

## 2017-09-23 DIAGNOSIS — R531 Weakness: Secondary | ICD-10-CM | POA: Diagnosis not present

## 2017-09-23 DIAGNOSIS — I2510 Atherosclerotic heart disease of native coronary artery without angina pectoris: Secondary | ICD-10-CM | POA: Diagnosis not present

## 2017-09-23 DIAGNOSIS — M1991 Primary osteoarthritis, unspecified site: Secondary | ICD-10-CM | POA: Diagnosis not present

## 2017-09-23 DIAGNOSIS — I509 Heart failure, unspecified: Secondary | ICD-10-CM | POA: Diagnosis not present

## 2017-09-23 DIAGNOSIS — Z5181 Encounter for therapeutic drug level monitoring: Secondary | ICD-10-CM | POA: Diagnosis not present

## 2017-09-23 DIAGNOSIS — Z48815 Encounter for surgical aftercare following surgery on the digestive system: Secondary | ICD-10-CM | POA: Diagnosis not present

## 2017-09-23 DIAGNOSIS — Z86711 Personal history of pulmonary embolism: Secondary | ICD-10-CM | POA: Diagnosis not present

## 2017-09-23 DIAGNOSIS — Z931 Gastrostomy status: Secondary | ICD-10-CM | POA: Diagnosis not present

## 2017-09-23 DIAGNOSIS — E1151 Type 2 diabetes mellitus with diabetic peripheral angiopathy without gangrene: Secondary | ICD-10-CM | POA: Diagnosis not present

## 2017-09-24 DIAGNOSIS — Z9689 Presence of other specified functional implants: Secondary | ICD-10-CM | POA: Diagnosis not present

## 2017-09-24 DIAGNOSIS — K8591 Acute pancreatitis with uninfected necrosis, unspecified: Secondary | ICD-10-CM | POA: Diagnosis not present

## 2017-09-24 DIAGNOSIS — E1140 Type 2 diabetes mellitus with diabetic neuropathy, unspecified: Secondary | ICD-10-CM | POA: Diagnosis not present

## 2017-09-24 DIAGNOSIS — E1151 Type 2 diabetes mellitus with diabetic peripheral angiopathy without gangrene: Secondary | ICD-10-CM | POA: Diagnosis not present

## 2017-09-24 DIAGNOSIS — E1165 Type 2 diabetes mellitus with hyperglycemia: Secondary | ICD-10-CM | POA: Diagnosis not present

## 2017-09-24 DIAGNOSIS — Z9049 Acquired absence of other specified parts of digestive tract: Secondary | ICD-10-CM | POA: Diagnosis not present

## 2017-09-24 DIAGNOSIS — K8511 Biliary acute pancreatitis with uninfected necrosis: Secondary | ICD-10-CM | POA: Diagnosis not present

## 2017-09-25 DIAGNOSIS — K859 Acute pancreatitis without necrosis or infection, unspecified: Secondary | ICD-10-CM | POA: Diagnosis not present

## 2017-09-25 DIAGNOSIS — Z48815 Encounter for surgical aftercare following surgery on the digestive system: Secondary | ICD-10-CM | POA: Diagnosis not present

## 2017-09-25 DIAGNOSIS — Z7901 Long term (current) use of anticoagulants: Secondary | ICD-10-CM | POA: Diagnosis not present

## 2017-09-25 DIAGNOSIS — E1142 Type 2 diabetes mellitus with diabetic polyneuropathy: Secondary | ICD-10-CM | POA: Diagnosis not present

## 2017-09-25 DIAGNOSIS — Z86711 Personal history of pulmonary embolism: Secondary | ICD-10-CM | POA: Diagnosis not present

## 2017-09-25 DIAGNOSIS — I2510 Atherosclerotic heart disease of native coronary artery without angina pectoris: Secondary | ICD-10-CM | POA: Diagnosis not present

## 2017-09-25 DIAGNOSIS — E1151 Type 2 diabetes mellitus with diabetic peripheral angiopathy without gangrene: Secondary | ICD-10-CM | POA: Diagnosis not present

## 2017-09-25 DIAGNOSIS — Z5181 Encounter for therapeutic drug level monitoring: Secondary | ICD-10-CM | POA: Diagnosis not present

## 2017-09-25 DIAGNOSIS — I480 Paroxysmal atrial fibrillation: Secondary | ICD-10-CM | POA: Diagnosis not present

## 2017-09-25 DIAGNOSIS — R531 Weakness: Secondary | ICD-10-CM | POA: Diagnosis not present

## 2017-09-25 DIAGNOSIS — Z931 Gastrostomy status: Secondary | ICD-10-CM | POA: Diagnosis not present

## 2017-09-25 DIAGNOSIS — Z794 Long term (current) use of insulin: Secondary | ICD-10-CM | POA: Diagnosis not present

## 2017-09-25 DIAGNOSIS — I509 Heart failure, unspecified: Secondary | ICD-10-CM | POA: Diagnosis not present

## 2017-09-25 DIAGNOSIS — I110 Hypertensive heart disease with heart failure: Secondary | ICD-10-CM | POA: Diagnosis not present

## 2017-09-25 DIAGNOSIS — Z8631 Personal history of diabetic foot ulcer: Secondary | ICD-10-CM | POA: Diagnosis not present

## 2017-09-25 DIAGNOSIS — M1991 Primary osteoarthritis, unspecified site: Secondary | ICD-10-CM | POA: Diagnosis not present

## 2017-09-29 DIAGNOSIS — Z5181 Encounter for therapeutic drug level monitoring: Secondary | ICD-10-CM | POA: Diagnosis not present

## 2017-09-29 DIAGNOSIS — E1151 Type 2 diabetes mellitus with diabetic peripheral angiopathy without gangrene: Secondary | ICD-10-CM | POA: Diagnosis not present

## 2017-09-29 DIAGNOSIS — Z931 Gastrostomy status: Secondary | ICD-10-CM | POA: Diagnosis not present

## 2017-09-29 DIAGNOSIS — Z8631 Personal history of diabetic foot ulcer: Secondary | ICD-10-CM | POA: Diagnosis not present

## 2017-09-29 DIAGNOSIS — M1991 Primary osteoarthritis, unspecified site: Secondary | ICD-10-CM | POA: Diagnosis not present

## 2017-09-29 DIAGNOSIS — Z48815 Encounter for surgical aftercare following surgery on the digestive system: Secondary | ICD-10-CM | POA: Diagnosis not present

## 2017-09-29 DIAGNOSIS — R531 Weakness: Secondary | ICD-10-CM | POA: Diagnosis not present

## 2017-09-29 DIAGNOSIS — E1142 Type 2 diabetes mellitus with diabetic polyneuropathy: Secondary | ICD-10-CM | POA: Diagnosis not present

## 2017-09-29 DIAGNOSIS — I480 Paroxysmal atrial fibrillation: Secondary | ICD-10-CM | POA: Diagnosis not present

## 2017-09-29 DIAGNOSIS — Z794 Long term (current) use of insulin: Secondary | ICD-10-CM | POA: Diagnosis not present

## 2017-09-29 DIAGNOSIS — I509 Heart failure, unspecified: Secondary | ICD-10-CM | POA: Diagnosis not present

## 2017-09-29 DIAGNOSIS — Z7901 Long term (current) use of anticoagulants: Secondary | ICD-10-CM | POA: Diagnosis not present

## 2017-09-29 DIAGNOSIS — I110 Hypertensive heart disease with heart failure: Secondary | ICD-10-CM | POA: Diagnosis not present

## 2017-09-29 DIAGNOSIS — Z86711 Personal history of pulmonary embolism: Secondary | ICD-10-CM | POA: Diagnosis not present

## 2017-09-29 DIAGNOSIS — I2510 Atherosclerotic heart disease of native coronary artery without angina pectoris: Secondary | ICD-10-CM | POA: Diagnosis not present

## 2017-10-01 DIAGNOSIS — Z48815 Encounter for surgical aftercare following surgery on the digestive system: Secondary | ICD-10-CM | POA: Diagnosis not present

## 2017-10-01 DIAGNOSIS — Z7901 Long term (current) use of anticoagulants: Secondary | ICD-10-CM | POA: Diagnosis not present

## 2017-10-01 DIAGNOSIS — E1142 Type 2 diabetes mellitus with diabetic polyneuropathy: Secondary | ICD-10-CM | POA: Diagnosis not present

## 2017-10-01 DIAGNOSIS — I2510 Atherosclerotic heart disease of native coronary artery without angina pectoris: Secondary | ICD-10-CM | POA: Diagnosis not present

## 2017-10-01 DIAGNOSIS — Z931 Gastrostomy status: Secondary | ICD-10-CM | POA: Diagnosis not present

## 2017-10-01 DIAGNOSIS — I480 Paroxysmal atrial fibrillation: Secondary | ICD-10-CM | POA: Diagnosis not present

## 2017-10-01 DIAGNOSIS — R531 Weakness: Secondary | ICD-10-CM | POA: Diagnosis not present

## 2017-10-01 DIAGNOSIS — Z794 Long term (current) use of insulin: Secondary | ICD-10-CM | POA: Diagnosis not present

## 2017-10-01 DIAGNOSIS — E1151 Type 2 diabetes mellitus with diabetic peripheral angiopathy without gangrene: Secondary | ICD-10-CM | POA: Diagnosis not present

## 2017-10-01 DIAGNOSIS — Z86711 Personal history of pulmonary embolism: Secondary | ICD-10-CM | POA: Diagnosis not present

## 2017-10-01 DIAGNOSIS — M1991 Primary osteoarthritis, unspecified site: Secondary | ICD-10-CM | POA: Diagnosis not present

## 2017-10-01 DIAGNOSIS — Z5181 Encounter for therapeutic drug level monitoring: Secondary | ICD-10-CM | POA: Diagnosis not present

## 2017-10-01 DIAGNOSIS — Z8631 Personal history of diabetic foot ulcer: Secondary | ICD-10-CM | POA: Diagnosis not present

## 2017-10-01 DIAGNOSIS — I110 Hypertensive heart disease with heart failure: Secondary | ICD-10-CM | POA: Diagnosis not present

## 2017-10-01 DIAGNOSIS — I509 Heart failure, unspecified: Secondary | ICD-10-CM | POA: Diagnosis not present

## 2017-10-02 DIAGNOSIS — R531 Weakness: Secondary | ICD-10-CM | POA: Diagnosis not present

## 2017-10-02 DIAGNOSIS — Z931 Gastrostomy status: Secondary | ICD-10-CM | POA: Diagnosis not present

## 2017-10-02 DIAGNOSIS — E1151 Type 2 diabetes mellitus with diabetic peripheral angiopathy without gangrene: Secondary | ICD-10-CM | POA: Diagnosis not present

## 2017-10-02 DIAGNOSIS — Z86711 Personal history of pulmonary embolism: Secondary | ICD-10-CM | POA: Diagnosis not present

## 2017-10-02 DIAGNOSIS — I509 Heart failure, unspecified: Secondary | ICD-10-CM | POA: Diagnosis not present

## 2017-10-02 DIAGNOSIS — M1991 Primary osteoarthritis, unspecified site: Secondary | ICD-10-CM | POA: Diagnosis not present

## 2017-10-02 DIAGNOSIS — Z5181 Encounter for therapeutic drug level monitoring: Secondary | ICD-10-CM | POA: Diagnosis not present

## 2017-10-02 DIAGNOSIS — Z8631 Personal history of diabetic foot ulcer: Secondary | ICD-10-CM | POA: Diagnosis not present

## 2017-10-02 DIAGNOSIS — I2510 Atherosclerotic heart disease of native coronary artery without angina pectoris: Secondary | ICD-10-CM | POA: Diagnosis not present

## 2017-10-02 DIAGNOSIS — Z48815 Encounter for surgical aftercare following surgery on the digestive system: Secondary | ICD-10-CM | POA: Diagnosis not present

## 2017-10-02 DIAGNOSIS — I480 Paroxysmal atrial fibrillation: Secondary | ICD-10-CM | POA: Diagnosis not present

## 2017-10-02 DIAGNOSIS — I110 Hypertensive heart disease with heart failure: Secondary | ICD-10-CM | POA: Diagnosis not present

## 2017-10-02 DIAGNOSIS — E1142 Type 2 diabetes mellitus with diabetic polyneuropathy: Secondary | ICD-10-CM | POA: Diagnosis not present

## 2017-10-02 DIAGNOSIS — Z7901 Long term (current) use of anticoagulants: Secondary | ICD-10-CM | POA: Diagnosis not present

## 2017-10-02 DIAGNOSIS — Z794 Long term (current) use of insulin: Secondary | ICD-10-CM | POA: Diagnosis not present

## 2017-10-06 DIAGNOSIS — Z5181 Encounter for therapeutic drug level monitoring: Secondary | ICD-10-CM | POA: Diagnosis not present

## 2017-10-06 DIAGNOSIS — M1991 Primary osteoarthritis, unspecified site: Secondary | ICD-10-CM | POA: Diagnosis not present

## 2017-10-06 DIAGNOSIS — I480 Paroxysmal atrial fibrillation: Secondary | ICD-10-CM | POA: Diagnosis not present

## 2017-10-06 DIAGNOSIS — I2510 Atherosclerotic heart disease of native coronary artery without angina pectoris: Secondary | ICD-10-CM | POA: Diagnosis not present

## 2017-10-06 DIAGNOSIS — Z86711 Personal history of pulmonary embolism: Secondary | ICD-10-CM | POA: Diagnosis not present

## 2017-10-06 DIAGNOSIS — Z7901 Long term (current) use of anticoagulants: Secondary | ICD-10-CM | POA: Diagnosis not present

## 2017-10-06 DIAGNOSIS — E1151 Type 2 diabetes mellitus with diabetic peripheral angiopathy without gangrene: Secondary | ICD-10-CM | POA: Diagnosis not present

## 2017-10-06 DIAGNOSIS — R531 Weakness: Secondary | ICD-10-CM | POA: Diagnosis not present

## 2017-10-06 DIAGNOSIS — I509 Heart failure, unspecified: Secondary | ICD-10-CM | POA: Diagnosis not present

## 2017-10-06 DIAGNOSIS — E1142 Type 2 diabetes mellitus with diabetic polyneuropathy: Secondary | ICD-10-CM | POA: Diagnosis not present

## 2017-10-06 DIAGNOSIS — Z48815 Encounter for surgical aftercare following surgery on the digestive system: Secondary | ICD-10-CM | POA: Diagnosis not present

## 2017-10-06 DIAGNOSIS — Z794 Long term (current) use of insulin: Secondary | ICD-10-CM | POA: Diagnosis not present

## 2017-10-06 DIAGNOSIS — Z8631 Personal history of diabetic foot ulcer: Secondary | ICD-10-CM | POA: Diagnosis not present

## 2017-10-06 DIAGNOSIS — Z931 Gastrostomy status: Secondary | ICD-10-CM | POA: Diagnosis not present

## 2017-10-06 DIAGNOSIS — I110 Hypertensive heart disease with heart failure: Secondary | ICD-10-CM | POA: Diagnosis not present

## 2017-10-07 DIAGNOSIS — Z931 Gastrostomy status: Secondary | ICD-10-CM | POA: Diagnosis not present

## 2017-10-07 DIAGNOSIS — K8511 Biliary acute pancreatitis with uninfected necrosis: Secondary | ICD-10-CM | POA: Diagnosis not present

## 2017-10-07 DIAGNOSIS — Z48815 Encounter for surgical aftercare following surgery on the digestive system: Secondary | ICD-10-CM | POA: Diagnosis not present

## 2017-10-07 DIAGNOSIS — K8512 Biliary acute pancreatitis with infected necrosis: Secondary | ICD-10-CM | POA: Diagnosis not present

## 2017-10-07 DIAGNOSIS — K8591 Acute pancreatitis with uninfected necrosis, unspecified: Secondary | ICD-10-CM | POA: Diagnosis not present

## 2017-10-07 DIAGNOSIS — K851 Biliary acute pancreatitis without necrosis or infection: Secondary | ICD-10-CM | POA: Diagnosis not present

## 2017-10-08 DIAGNOSIS — Z931 Gastrostomy status: Secondary | ICD-10-CM | POA: Diagnosis not present

## 2017-10-08 DIAGNOSIS — I480 Paroxysmal atrial fibrillation: Secondary | ICD-10-CM | POA: Diagnosis not present

## 2017-10-08 DIAGNOSIS — Z8631 Personal history of diabetic foot ulcer: Secondary | ICD-10-CM | POA: Diagnosis not present

## 2017-10-08 DIAGNOSIS — E1142 Type 2 diabetes mellitus with diabetic polyneuropathy: Secondary | ICD-10-CM | POA: Diagnosis not present

## 2017-10-08 DIAGNOSIS — Z7901 Long term (current) use of anticoagulants: Secondary | ICD-10-CM | POA: Diagnosis not present

## 2017-10-08 DIAGNOSIS — I110 Hypertensive heart disease with heart failure: Secondary | ICD-10-CM | POA: Diagnosis not present

## 2017-10-08 DIAGNOSIS — I509 Heart failure, unspecified: Secondary | ICD-10-CM | POA: Diagnosis not present

## 2017-10-08 DIAGNOSIS — Z48815 Encounter for surgical aftercare following surgery on the digestive system: Secondary | ICD-10-CM | POA: Diagnosis not present

## 2017-10-08 DIAGNOSIS — M1991 Primary osteoarthritis, unspecified site: Secondary | ICD-10-CM | POA: Diagnosis not present

## 2017-10-08 DIAGNOSIS — R531 Weakness: Secondary | ICD-10-CM | POA: Diagnosis not present

## 2017-10-08 DIAGNOSIS — Z5181 Encounter for therapeutic drug level monitoring: Secondary | ICD-10-CM | POA: Diagnosis not present

## 2017-10-08 DIAGNOSIS — Z86711 Personal history of pulmonary embolism: Secondary | ICD-10-CM | POA: Diagnosis not present

## 2017-10-08 DIAGNOSIS — Z794 Long term (current) use of insulin: Secondary | ICD-10-CM | POA: Diagnosis not present

## 2017-10-08 DIAGNOSIS — E1151 Type 2 diabetes mellitus with diabetic peripheral angiopathy without gangrene: Secondary | ICD-10-CM | POA: Diagnosis not present

## 2017-10-08 DIAGNOSIS — I2510 Atherosclerotic heart disease of native coronary artery without angina pectoris: Secondary | ICD-10-CM | POA: Diagnosis not present

## 2017-10-09 DIAGNOSIS — I110 Hypertensive heart disease with heart failure: Secondary | ICD-10-CM | POA: Diagnosis not present

## 2017-10-09 DIAGNOSIS — Z7901 Long term (current) use of anticoagulants: Secondary | ICD-10-CM | POA: Diagnosis not present

## 2017-10-09 DIAGNOSIS — Z48815 Encounter for surgical aftercare following surgery on the digestive system: Secondary | ICD-10-CM | POA: Diagnosis not present

## 2017-10-09 DIAGNOSIS — R531 Weakness: Secondary | ICD-10-CM | POA: Diagnosis not present

## 2017-10-09 DIAGNOSIS — E1142 Type 2 diabetes mellitus with diabetic polyneuropathy: Secondary | ICD-10-CM | POA: Diagnosis not present

## 2017-10-09 DIAGNOSIS — I2510 Atherosclerotic heart disease of native coronary artery without angina pectoris: Secondary | ICD-10-CM | POA: Diagnosis not present

## 2017-10-09 DIAGNOSIS — I480 Paroxysmal atrial fibrillation: Secondary | ICD-10-CM | POA: Diagnosis not present

## 2017-10-09 DIAGNOSIS — Z794 Long term (current) use of insulin: Secondary | ICD-10-CM | POA: Diagnosis not present

## 2017-10-09 DIAGNOSIS — Z5181 Encounter for therapeutic drug level monitoring: Secondary | ICD-10-CM | POA: Diagnosis not present

## 2017-10-09 DIAGNOSIS — Z8631 Personal history of diabetic foot ulcer: Secondary | ICD-10-CM | POA: Diagnosis not present

## 2017-10-09 DIAGNOSIS — I509 Heart failure, unspecified: Secondary | ICD-10-CM | POA: Diagnosis not present

## 2017-10-09 DIAGNOSIS — M1991 Primary osteoarthritis, unspecified site: Secondary | ICD-10-CM | POA: Diagnosis not present

## 2017-10-09 DIAGNOSIS — Z86711 Personal history of pulmonary embolism: Secondary | ICD-10-CM | POA: Diagnosis not present

## 2017-10-09 DIAGNOSIS — E1151 Type 2 diabetes mellitus with diabetic peripheral angiopathy without gangrene: Secondary | ICD-10-CM | POA: Diagnosis not present

## 2017-10-09 DIAGNOSIS — Z931 Gastrostomy status: Secondary | ICD-10-CM | POA: Diagnosis not present

## 2017-10-13 DIAGNOSIS — I110 Hypertensive heart disease with heart failure: Secondary | ICD-10-CM | POA: Diagnosis not present

## 2017-10-13 DIAGNOSIS — M1991 Primary osteoarthritis, unspecified site: Secondary | ICD-10-CM | POA: Diagnosis not present

## 2017-10-13 DIAGNOSIS — I2510 Atherosclerotic heart disease of native coronary artery without angina pectoris: Secondary | ICD-10-CM | POA: Diagnosis not present

## 2017-10-13 DIAGNOSIS — Z86711 Personal history of pulmonary embolism: Secondary | ICD-10-CM | POA: Diagnosis not present

## 2017-10-13 DIAGNOSIS — Z794 Long term (current) use of insulin: Secondary | ICD-10-CM | POA: Diagnosis not present

## 2017-10-13 DIAGNOSIS — Z48815 Encounter for surgical aftercare following surgery on the digestive system: Secondary | ICD-10-CM | POA: Diagnosis not present

## 2017-10-13 DIAGNOSIS — I480 Paroxysmal atrial fibrillation: Secondary | ICD-10-CM | POA: Diagnosis not present

## 2017-10-13 DIAGNOSIS — R531 Weakness: Secondary | ICD-10-CM | POA: Diagnosis not present

## 2017-10-13 DIAGNOSIS — E1142 Type 2 diabetes mellitus with diabetic polyneuropathy: Secondary | ICD-10-CM | POA: Diagnosis not present

## 2017-10-13 DIAGNOSIS — Z8631 Personal history of diabetic foot ulcer: Secondary | ICD-10-CM | POA: Diagnosis not present

## 2017-10-13 DIAGNOSIS — E1151 Type 2 diabetes mellitus with diabetic peripheral angiopathy without gangrene: Secondary | ICD-10-CM | POA: Diagnosis not present

## 2017-10-13 DIAGNOSIS — Z931 Gastrostomy status: Secondary | ICD-10-CM | POA: Diagnosis not present

## 2017-10-13 DIAGNOSIS — Z7901 Long term (current) use of anticoagulants: Secondary | ICD-10-CM | POA: Diagnosis not present

## 2017-10-13 DIAGNOSIS — Z5181 Encounter for therapeutic drug level monitoring: Secondary | ICD-10-CM | POA: Diagnosis not present

## 2017-10-13 DIAGNOSIS — I509 Heart failure, unspecified: Secondary | ICD-10-CM | POA: Diagnosis not present

## 2017-10-15 DIAGNOSIS — K8591 Acute pancreatitis with uninfected necrosis, unspecified: Secondary | ICD-10-CM | POA: Diagnosis not present

## 2017-10-15 DIAGNOSIS — K8511 Biliary acute pancreatitis with uninfected necrosis: Secondary | ICD-10-CM | POA: Diagnosis not present

## 2017-10-16 DIAGNOSIS — Z5181 Encounter for therapeutic drug level monitoring: Secondary | ICD-10-CM | POA: Diagnosis not present

## 2017-10-16 DIAGNOSIS — Z794 Long term (current) use of insulin: Secondary | ICD-10-CM | POA: Diagnosis not present

## 2017-10-16 DIAGNOSIS — Z7901 Long term (current) use of anticoagulants: Secondary | ICD-10-CM | POA: Diagnosis not present

## 2017-10-16 DIAGNOSIS — R531 Weakness: Secondary | ICD-10-CM | POA: Diagnosis not present

## 2017-10-16 DIAGNOSIS — Z8631 Personal history of diabetic foot ulcer: Secondary | ICD-10-CM | POA: Diagnosis not present

## 2017-10-16 DIAGNOSIS — Z931 Gastrostomy status: Secondary | ICD-10-CM | POA: Diagnosis not present

## 2017-10-16 DIAGNOSIS — M1991 Primary osteoarthritis, unspecified site: Secondary | ICD-10-CM | POA: Diagnosis not present

## 2017-10-16 DIAGNOSIS — Z48815 Encounter for surgical aftercare following surgery on the digestive system: Secondary | ICD-10-CM | POA: Diagnosis not present

## 2017-10-16 DIAGNOSIS — Z86711 Personal history of pulmonary embolism: Secondary | ICD-10-CM | POA: Diagnosis not present

## 2017-10-16 DIAGNOSIS — E1151 Type 2 diabetes mellitus with diabetic peripheral angiopathy without gangrene: Secondary | ICD-10-CM | POA: Diagnosis not present

## 2017-10-16 DIAGNOSIS — I509 Heart failure, unspecified: Secondary | ICD-10-CM | POA: Diagnosis not present

## 2017-10-16 DIAGNOSIS — I480 Paroxysmal atrial fibrillation: Secondary | ICD-10-CM | POA: Diagnosis not present

## 2017-10-16 DIAGNOSIS — I2510 Atherosclerotic heart disease of native coronary artery without angina pectoris: Secondary | ICD-10-CM | POA: Diagnosis not present

## 2017-10-16 DIAGNOSIS — E1142 Type 2 diabetes mellitus with diabetic polyneuropathy: Secondary | ICD-10-CM | POA: Diagnosis not present

## 2017-10-16 DIAGNOSIS — I110 Hypertensive heart disease with heart failure: Secondary | ICD-10-CM | POA: Diagnosis not present

## 2017-10-19 DIAGNOSIS — I509 Heart failure, unspecified: Secondary | ICD-10-CM | POA: Diagnosis not present

## 2017-10-19 DIAGNOSIS — I480 Paroxysmal atrial fibrillation: Secondary | ICD-10-CM | POA: Diagnosis not present

## 2017-10-19 DIAGNOSIS — Z48815 Encounter for surgical aftercare following surgery on the digestive system: Secondary | ICD-10-CM | POA: Diagnosis not present

## 2017-10-19 DIAGNOSIS — Z86711 Personal history of pulmonary embolism: Secondary | ICD-10-CM | POA: Diagnosis not present

## 2017-10-19 DIAGNOSIS — I2510 Atherosclerotic heart disease of native coronary artery without angina pectoris: Secondary | ICD-10-CM | POA: Diagnosis not present

## 2017-10-19 DIAGNOSIS — Z7901 Long term (current) use of anticoagulants: Secondary | ICD-10-CM | POA: Diagnosis not present

## 2017-10-19 DIAGNOSIS — M1991 Primary osteoarthritis, unspecified site: Secondary | ICD-10-CM | POA: Diagnosis not present

## 2017-10-19 DIAGNOSIS — Z931 Gastrostomy status: Secondary | ICD-10-CM | POA: Diagnosis not present

## 2017-10-19 DIAGNOSIS — Z5181 Encounter for therapeutic drug level monitoring: Secondary | ICD-10-CM | POA: Diagnosis not present

## 2017-10-19 DIAGNOSIS — I110 Hypertensive heart disease with heart failure: Secondary | ICD-10-CM | POA: Diagnosis not present

## 2017-10-19 DIAGNOSIS — E1151 Type 2 diabetes mellitus with diabetic peripheral angiopathy without gangrene: Secondary | ICD-10-CM | POA: Diagnosis not present

## 2017-10-19 DIAGNOSIS — Z8631 Personal history of diabetic foot ulcer: Secondary | ICD-10-CM | POA: Diagnosis not present

## 2017-10-19 DIAGNOSIS — R531 Weakness: Secondary | ICD-10-CM | POA: Diagnosis not present

## 2017-10-19 DIAGNOSIS — E1142 Type 2 diabetes mellitus with diabetic polyneuropathy: Secondary | ICD-10-CM | POA: Diagnosis not present

## 2017-10-19 DIAGNOSIS — Z794 Long term (current) use of insulin: Secondary | ICD-10-CM | POA: Diagnosis not present

## 2017-10-20 DIAGNOSIS — E1140 Type 2 diabetes mellitus with diabetic neuropathy, unspecified: Secondary | ICD-10-CM | POA: Diagnosis not present

## 2017-10-20 DIAGNOSIS — E1165 Type 2 diabetes mellitus with hyperglycemia: Secondary | ICD-10-CM | POA: Diagnosis not present

## 2017-10-20 DIAGNOSIS — E1142 Type 2 diabetes mellitus with diabetic polyneuropathy: Secondary | ICD-10-CM | POA: Diagnosis not present

## 2017-10-20 DIAGNOSIS — E785 Hyperlipidemia, unspecified: Secondary | ICD-10-CM | POA: Diagnosis not present

## 2017-10-20 DIAGNOSIS — E1151 Type 2 diabetes mellitus with diabetic peripheral angiopathy without gangrene: Secondary | ICD-10-CM | POA: Diagnosis not present

## 2017-10-20 DIAGNOSIS — Z79899 Other long term (current) drug therapy: Secondary | ICD-10-CM | POA: Diagnosis not present

## 2017-10-22 DIAGNOSIS — Z931 Gastrostomy status: Secondary | ICD-10-CM | POA: Diagnosis not present

## 2017-10-22 DIAGNOSIS — Z8631 Personal history of diabetic foot ulcer: Secondary | ICD-10-CM | POA: Diagnosis not present

## 2017-10-22 DIAGNOSIS — R531 Weakness: Secondary | ICD-10-CM | POA: Diagnosis not present

## 2017-10-22 DIAGNOSIS — I110 Hypertensive heart disease with heart failure: Secondary | ICD-10-CM | POA: Diagnosis not present

## 2017-10-22 DIAGNOSIS — I2510 Atherosclerotic heart disease of native coronary artery without angina pectoris: Secondary | ICD-10-CM | POA: Diagnosis not present

## 2017-10-22 DIAGNOSIS — Z48815 Encounter for surgical aftercare following surgery on the digestive system: Secondary | ICD-10-CM | POA: Diagnosis not present

## 2017-10-22 DIAGNOSIS — E1142 Type 2 diabetes mellitus with diabetic polyneuropathy: Secondary | ICD-10-CM | POA: Diagnosis not present

## 2017-10-22 DIAGNOSIS — E1151 Type 2 diabetes mellitus with diabetic peripheral angiopathy without gangrene: Secondary | ICD-10-CM | POA: Diagnosis not present

## 2017-10-22 DIAGNOSIS — Z7901 Long term (current) use of anticoagulants: Secondary | ICD-10-CM | POA: Diagnosis not present

## 2017-10-22 DIAGNOSIS — Z794 Long term (current) use of insulin: Secondary | ICD-10-CM | POA: Diagnosis not present

## 2017-10-22 DIAGNOSIS — I509 Heart failure, unspecified: Secondary | ICD-10-CM | POA: Diagnosis not present

## 2017-10-22 DIAGNOSIS — M1991 Primary osteoarthritis, unspecified site: Secondary | ICD-10-CM | POA: Diagnosis not present

## 2017-10-22 DIAGNOSIS — Z86711 Personal history of pulmonary embolism: Secondary | ICD-10-CM | POA: Diagnosis not present

## 2017-10-22 DIAGNOSIS — Z5181 Encounter for therapeutic drug level monitoring: Secondary | ICD-10-CM | POA: Diagnosis not present

## 2017-10-22 DIAGNOSIS — I480 Paroxysmal atrial fibrillation: Secondary | ICD-10-CM | POA: Diagnosis not present

## 2017-10-25 DIAGNOSIS — K859 Acute pancreatitis without necrosis or infection, unspecified: Secondary | ICD-10-CM | POA: Diagnosis not present

## 2017-10-27 DIAGNOSIS — Z931 Gastrostomy status: Secondary | ICD-10-CM | POA: Diagnosis not present

## 2017-10-27 DIAGNOSIS — I509 Heart failure, unspecified: Secondary | ICD-10-CM | POA: Diagnosis not present

## 2017-10-27 DIAGNOSIS — Z48815 Encounter for surgical aftercare following surgery on the digestive system: Secondary | ICD-10-CM | POA: Diagnosis not present

## 2017-10-27 DIAGNOSIS — I480 Paroxysmal atrial fibrillation: Secondary | ICD-10-CM | POA: Diagnosis not present

## 2017-10-27 DIAGNOSIS — Z7901 Long term (current) use of anticoagulants: Secondary | ICD-10-CM | POA: Diagnosis not present

## 2017-10-27 DIAGNOSIS — R531 Weakness: Secondary | ICD-10-CM | POA: Diagnosis not present

## 2017-10-27 DIAGNOSIS — I2510 Atherosclerotic heart disease of native coronary artery without angina pectoris: Secondary | ICD-10-CM | POA: Diagnosis not present

## 2017-10-27 DIAGNOSIS — Z8631 Personal history of diabetic foot ulcer: Secondary | ICD-10-CM | POA: Diagnosis not present

## 2017-10-27 DIAGNOSIS — E1151 Type 2 diabetes mellitus with diabetic peripheral angiopathy without gangrene: Secondary | ICD-10-CM | POA: Diagnosis not present

## 2017-10-27 DIAGNOSIS — Z5181 Encounter for therapeutic drug level monitoring: Secondary | ICD-10-CM | POA: Diagnosis not present

## 2017-10-27 DIAGNOSIS — Z794 Long term (current) use of insulin: Secondary | ICD-10-CM | POA: Diagnosis not present

## 2017-10-27 DIAGNOSIS — I110 Hypertensive heart disease with heart failure: Secondary | ICD-10-CM | POA: Diagnosis not present

## 2017-10-27 DIAGNOSIS — Z86711 Personal history of pulmonary embolism: Secondary | ICD-10-CM | POA: Diagnosis not present

## 2017-10-27 DIAGNOSIS — M1991 Primary osteoarthritis, unspecified site: Secondary | ICD-10-CM | POA: Diagnosis not present

## 2017-10-27 DIAGNOSIS — E1142 Type 2 diabetes mellitus with diabetic polyneuropathy: Secondary | ICD-10-CM | POA: Diagnosis not present

## 2017-10-28 DIAGNOSIS — E1151 Type 2 diabetes mellitus with diabetic peripheral angiopathy without gangrene: Secondary | ICD-10-CM | POA: Diagnosis not present

## 2017-10-28 DIAGNOSIS — Z86711 Personal history of pulmonary embolism: Secondary | ICD-10-CM | POA: Diagnosis not present

## 2017-10-28 DIAGNOSIS — Z8631 Personal history of diabetic foot ulcer: Secondary | ICD-10-CM | POA: Diagnosis not present

## 2017-10-28 DIAGNOSIS — I509 Heart failure, unspecified: Secondary | ICD-10-CM | POA: Diagnosis not present

## 2017-10-28 DIAGNOSIS — M1991 Primary osteoarthritis, unspecified site: Secondary | ICD-10-CM | POA: Diagnosis not present

## 2017-10-28 DIAGNOSIS — I480 Paroxysmal atrial fibrillation: Secondary | ICD-10-CM | POA: Diagnosis not present

## 2017-10-28 DIAGNOSIS — R531 Weakness: Secondary | ICD-10-CM | POA: Diagnosis not present

## 2017-10-28 DIAGNOSIS — Z7901 Long term (current) use of anticoagulants: Secondary | ICD-10-CM | POA: Diagnosis not present

## 2017-10-28 DIAGNOSIS — Z794 Long term (current) use of insulin: Secondary | ICD-10-CM | POA: Diagnosis not present

## 2017-10-28 DIAGNOSIS — E1142 Type 2 diabetes mellitus with diabetic polyneuropathy: Secondary | ICD-10-CM | POA: Diagnosis not present

## 2017-10-28 DIAGNOSIS — Z5181 Encounter for therapeutic drug level monitoring: Secondary | ICD-10-CM | POA: Diagnosis not present

## 2017-10-28 DIAGNOSIS — Z48815 Encounter for surgical aftercare following surgery on the digestive system: Secondary | ICD-10-CM | POA: Diagnosis not present

## 2017-10-28 DIAGNOSIS — I2510 Atherosclerotic heart disease of native coronary artery without angina pectoris: Secondary | ICD-10-CM | POA: Diagnosis not present

## 2017-10-28 DIAGNOSIS — Z931 Gastrostomy status: Secondary | ICD-10-CM | POA: Diagnosis not present

## 2017-10-28 DIAGNOSIS — I110 Hypertensive heart disease with heart failure: Secondary | ICD-10-CM | POA: Diagnosis not present

## 2017-10-29 DIAGNOSIS — I2510 Atherosclerotic heart disease of native coronary artery without angina pectoris: Secondary | ICD-10-CM | POA: Diagnosis not present

## 2017-10-29 DIAGNOSIS — I110 Hypertensive heart disease with heart failure: Secondary | ICD-10-CM | POA: Diagnosis not present

## 2017-10-29 DIAGNOSIS — Z794 Long term (current) use of insulin: Secondary | ICD-10-CM | POA: Diagnosis not present

## 2017-10-29 DIAGNOSIS — Z48815 Encounter for surgical aftercare following surgery on the digestive system: Secondary | ICD-10-CM | POA: Diagnosis not present

## 2017-10-29 DIAGNOSIS — R531 Weakness: Secondary | ICD-10-CM | POA: Diagnosis not present

## 2017-10-29 DIAGNOSIS — E1142 Type 2 diabetes mellitus with diabetic polyneuropathy: Secondary | ICD-10-CM | POA: Diagnosis not present

## 2017-10-29 DIAGNOSIS — Z8631 Personal history of diabetic foot ulcer: Secondary | ICD-10-CM | POA: Diagnosis not present

## 2017-10-29 DIAGNOSIS — M1991 Primary osteoarthritis, unspecified site: Secondary | ICD-10-CM | POA: Diagnosis not present

## 2017-10-29 DIAGNOSIS — I480 Paroxysmal atrial fibrillation: Secondary | ICD-10-CM | POA: Diagnosis not present

## 2017-10-29 DIAGNOSIS — Z931 Gastrostomy status: Secondary | ICD-10-CM | POA: Diagnosis not present

## 2017-10-29 DIAGNOSIS — Z7901 Long term (current) use of anticoagulants: Secondary | ICD-10-CM | POA: Diagnosis not present

## 2017-10-29 DIAGNOSIS — E1151 Type 2 diabetes mellitus with diabetic peripheral angiopathy without gangrene: Secondary | ICD-10-CM | POA: Diagnosis not present

## 2017-10-29 DIAGNOSIS — I509 Heart failure, unspecified: Secondary | ICD-10-CM | POA: Diagnosis not present

## 2017-10-29 DIAGNOSIS — Z5181 Encounter for therapeutic drug level monitoring: Secondary | ICD-10-CM | POA: Diagnosis not present

## 2017-10-29 DIAGNOSIS — Z86711 Personal history of pulmonary embolism: Secondary | ICD-10-CM | POA: Diagnosis not present

## 2017-11-02 DIAGNOSIS — I10 Essential (primary) hypertension: Secondary | ICD-10-CM | POA: Diagnosis not present

## 2017-11-02 DIAGNOSIS — Z794 Long term (current) use of insulin: Secondary | ICD-10-CM | POA: Diagnosis not present

## 2017-11-02 DIAGNOSIS — R1010 Upper abdominal pain, unspecified: Secondary | ICD-10-CM | POA: Diagnosis not present

## 2017-11-02 DIAGNOSIS — R55 Syncope and collapse: Secondary | ICD-10-CM | POA: Diagnosis not present

## 2017-11-02 DIAGNOSIS — E1165 Type 2 diabetes mellitus with hyperglycemia: Secondary | ICD-10-CM | POA: Diagnosis not present

## 2017-11-02 DIAGNOSIS — R739 Hyperglycemia, unspecified: Secondary | ICD-10-CM | POA: Diagnosis not present

## 2017-11-02 DIAGNOSIS — E119 Type 2 diabetes mellitus without complications: Secondary | ICD-10-CM | POA: Diagnosis not present

## 2017-11-02 DIAGNOSIS — Z79899 Other long term (current) drug therapy: Secondary | ICD-10-CM | POA: Diagnosis not present

## 2017-11-02 DIAGNOSIS — G8929 Other chronic pain: Secondary | ICD-10-CM | POA: Diagnosis not present

## 2017-11-05 DIAGNOSIS — E1142 Type 2 diabetes mellitus with diabetic polyneuropathy: Secondary | ICD-10-CM | POA: Diagnosis not present

## 2017-11-05 DIAGNOSIS — I480 Paroxysmal atrial fibrillation: Secondary | ICD-10-CM | POA: Diagnosis not present

## 2017-11-05 DIAGNOSIS — E1151 Type 2 diabetes mellitus with diabetic peripheral angiopathy without gangrene: Secondary | ICD-10-CM | POA: Diagnosis not present

## 2017-11-05 DIAGNOSIS — Z5181 Encounter for therapeutic drug level monitoring: Secondary | ICD-10-CM | POA: Diagnosis not present

## 2017-11-05 DIAGNOSIS — Z794 Long term (current) use of insulin: Secondary | ICD-10-CM | POA: Diagnosis not present

## 2017-11-05 DIAGNOSIS — I2510 Atherosclerotic heart disease of native coronary artery without angina pectoris: Secondary | ICD-10-CM | POA: Diagnosis not present

## 2017-11-05 DIAGNOSIS — M1991 Primary osteoarthritis, unspecified site: Secondary | ICD-10-CM | POA: Diagnosis not present

## 2017-11-05 DIAGNOSIS — R531 Weakness: Secondary | ICD-10-CM | POA: Diagnosis not present

## 2017-11-05 DIAGNOSIS — Z48815 Encounter for surgical aftercare following surgery on the digestive system: Secondary | ICD-10-CM | POA: Diagnosis not present

## 2017-11-05 DIAGNOSIS — Z8631 Personal history of diabetic foot ulcer: Secondary | ICD-10-CM | POA: Diagnosis not present

## 2017-11-05 DIAGNOSIS — I509 Heart failure, unspecified: Secondary | ICD-10-CM | POA: Diagnosis not present

## 2017-11-05 DIAGNOSIS — I110 Hypertensive heart disease with heart failure: Secondary | ICD-10-CM | POA: Diagnosis not present

## 2017-11-05 DIAGNOSIS — Z86711 Personal history of pulmonary embolism: Secondary | ICD-10-CM | POA: Diagnosis not present

## 2017-11-05 DIAGNOSIS — Z931 Gastrostomy status: Secondary | ICD-10-CM | POA: Diagnosis not present

## 2017-11-05 DIAGNOSIS — Z7901 Long term (current) use of anticoagulants: Secondary | ICD-10-CM | POA: Diagnosis not present

## 2017-11-10 DIAGNOSIS — L02211 Cutaneous abscess of abdominal wall: Secondary | ICD-10-CM | POA: Diagnosis not present

## 2017-11-11 DIAGNOSIS — Z7901 Long term (current) use of anticoagulants: Secondary | ICD-10-CM | POA: Diagnosis not present

## 2017-11-11 DIAGNOSIS — Z48815 Encounter for surgical aftercare following surgery on the digestive system: Secondary | ICD-10-CM | POA: Diagnosis not present

## 2017-11-11 DIAGNOSIS — Z8631 Personal history of diabetic foot ulcer: Secondary | ICD-10-CM | POA: Diagnosis not present

## 2017-11-11 DIAGNOSIS — E1142 Type 2 diabetes mellitus with diabetic polyneuropathy: Secondary | ICD-10-CM | POA: Diagnosis not present

## 2017-11-11 DIAGNOSIS — E1151 Type 2 diabetes mellitus with diabetic peripheral angiopathy without gangrene: Secondary | ICD-10-CM | POA: Diagnosis not present

## 2017-11-11 DIAGNOSIS — Z931 Gastrostomy status: Secondary | ICD-10-CM | POA: Diagnosis not present

## 2017-11-11 DIAGNOSIS — Z794 Long term (current) use of insulin: Secondary | ICD-10-CM | POA: Diagnosis not present

## 2017-11-11 DIAGNOSIS — I509 Heart failure, unspecified: Secondary | ICD-10-CM | POA: Diagnosis not present

## 2017-11-11 DIAGNOSIS — R531 Weakness: Secondary | ICD-10-CM | POA: Diagnosis not present

## 2017-11-11 DIAGNOSIS — M1991 Primary osteoarthritis, unspecified site: Secondary | ICD-10-CM | POA: Diagnosis not present

## 2017-11-11 DIAGNOSIS — Z86711 Personal history of pulmonary embolism: Secondary | ICD-10-CM | POA: Diagnosis not present

## 2017-11-11 DIAGNOSIS — Z5181 Encounter for therapeutic drug level monitoring: Secondary | ICD-10-CM | POA: Diagnosis not present

## 2017-11-11 DIAGNOSIS — I110 Hypertensive heart disease with heart failure: Secondary | ICD-10-CM | POA: Diagnosis not present

## 2017-11-11 DIAGNOSIS — I480 Paroxysmal atrial fibrillation: Secondary | ICD-10-CM | POA: Diagnosis not present

## 2017-11-11 DIAGNOSIS — I2510 Atherosclerotic heart disease of native coronary artery without angina pectoris: Secondary | ICD-10-CM | POA: Diagnosis not present

## 2017-11-17 DIAGNOSIS — Z8631 Personal history of diabetic foot ulcer: Secondary | ICD-10-CM | POA: Diagnosis not present

## 2017-11-17 DIAGNOSIS — Z7901 Long term (current) use of anticoagulants: Secondary | ICD-10-CM | POA: Diagnosis not present

## 2017-11-17 DIAGNOSIS — I2510 Atherosclerotic heart disease of native coronary artery without angina pectoris: Secondary | ICD-10-CM | POA: Diagnosis not present

## 2017-11-17 DIAGNOSIS — Z794 Long term (current) use of insulin: Secondary | ICD-10-CM | POA: Diagnosis not present

## 2017-11-17 DIAGNOSIS — E1151 Type 2 diabetes mellitus with diabetic peripheral angiopathy without gangrene: Secondary | ICD-10-CM | POA: Diagnosis not present

## 2017-11-17 DIAGNOSIS — E1142 Type 2 diabetes mellitus with diabetic polyneuropathy: Secondary | ICD-10-CM | POA: Diagnosis not present

## 2017-11-17 DIAGNOSIS — Z86711 Personal history of pulmonary embolism: Secondary | ICD-10-CM | POA: Diagnosis not present

## 2017-11-17 DIAGNOSIS — I110 Hypertensive heart disease with heart failure: Secondary | ICD-10-CM | POA: Diagnosis not present

## 2017-11-17 DIAGNOSIS — I480 Paroxysmal atrial fibrillation: Secondary | ICD-10-CM | POA: Diagnosis not present

## 2017-11-17 DIAGNOSIS — Z48815 Encounter for surgical aftercare following surgery on the digestive system: Secondary | ICD-10-CM | POA: Diagnosis not present

## 2017-11-17 DIAGNOSIS — Z931 Gastrostomy status: Secondary | ICD-10-CM | POA: Diagnosis not present

## 2017-11-17 DIAGNOSIS — M1991 Primary osteoarthritis, unspecified site: Secondary | ICD-10-CM | POA: Diagnosis not present

## 2017-11-17 DIAGNOSIS — Z5181 Encounter for therapeutic drug level monitoring: Secondary | ICD-10-CM | POA: Diagnosis not present

## 2017-11-17 DIAGNOSIS — R531 Weakness: Secondary | ICD-10-CM | POA: Diagnosis not present

## 2017-11-17 DIAGNOSIS — I509 Heart failure, unspecified: Secondary | ICD-10-CM | POA: Diagnosis not present

## 2017-11-19 DIAGNOSIS — I2510 Atherosclerotic heart disease of native coronary artery without angina pectoris: Secondary | ICD-10-CM | POA: Diagnosis not present

## 2017-11-19 DIAGNOSIS — I480 Paroxysmal atrial fibrillation: Secondary | ICD-10-CM | POA: Diagnosis not present

## 2017-11-19 DIAGNOSIS — E1151 Type 2 diabetes mellitus with diabetic peripheral angiopathy without gangrene: Secondary | ICD-10-CM | POA: Diagnosis not present

## 2017-11-19 DIAGNOSIS — Z7901 Long term (current) use of anticoagulants: Secondary | ICD-10-CM | POA: Diagnosis not present

## 2017-11-19 DIAGNOSIS — Z794 Long term (current) use of insulin: Secondary | ICD-10-CM | POA: Diagnosis not present

## 2017-11-19 DIAGNOSIS — Z48815 Encounter for surgical aftercare following surgery on the digestive system: Secondary | ICD-10-CM | POA: Diagnosis not present

## 2017-11-19 DIAGNOSIS — I110 Hypertensive heart disease with heart failure: Secondary | ICD-10-CM | POA: Diagnosis not present

## 2017-11-19 DIAGNOSIS — Z8631 Personal history of diabetic foot ulcer: Secondary | ICD-10-CM | POA: Diagnosis not present

## 2017-11-19 DIAGNOSIS — R531 Weakness: Secondary | ICD-10-CM | POA: Diagnosis not present

## 2017-11-19 DIAGNOSIS — Z86711 Personal history of pulmonary embolism: Secondary | ICD-10-CM | POA: Diagnosis not present

## 2017-11-19 DIAGNOSIS — Z5181 Encounter for therapeutic drug level monitoring: Secondary | ICD-10-CM | POA: Diagnosis not present

## 2017-11-19 DIAGNOSIS — E1142 Type 2 diabetes mellitus with diabetic polyneuropathy: Secondary | ICD-10-CM | POA: Diagnosis not present

## 2017-11-19 DIAGNOSIS — I509 Heart failure, unspecified: Secondary | ICD-10-CM | POA: Diagnosis not present

## 2017-11-19 DIAGNOSIS — Z931 Gastrostomy status: Secondary | ICD-10-CM | POA: Diagnosis not present

## 2017-11-19 DIAGNOSIS — M1991 Primary osteoarthritis, unspecified site: Secondary | ICD-10-CM | POA: Diagnosis not present

## 2017-11-23 DIAGNOSIS — E1151 Type 2 diabetes mellitus with diabetic peripheral angiopathy without gangrene: Secondary | ICD-10-CM | POA: Diagnosis not present

## 2017-11-23 DIAGNOSIS — E1142 Type 2 diabetes mellitus with diabetic polyneuropathy: Secondary | ICD-10-CM | POA: Diagnosis not present

## 2017-11-23 DIAGNOSIS — Z931 Gastrostomy status: Secondary | ICD-10-CM | POA: Diagnosis not present

## 2017-11-23 DIAGNOSIS — I110 Hypertensive heart disease with heart failure: Secondary | ICD-10-CM | POA: Diagnosis not present

## 2017-11-23 DIAGNOSIS — Z8631 Personal history of diabetic foot ulcer: Secondary | ICD-10-CM | POA: Diagnosis not present

## 2017-11-23 DIAGNOSIS — Z5181 Encounter for therapeutic drug level monitoring: Secondary | ICD-10-CM | POA: Diagnosis not present

## 2017-11-23 DIAGNOSIS — Z794 Long term (current) use of insulin: Secondary | ICD-10-CM | POA: Diagnosis not present

## 2017-11-23 DIAGNOSIS — I2510 Atherosclerotic heart disease of native coronary artery without angina pectoris: Secondary | ICD-10-CM | POA: Diagnosis not present

## 2017-11-23 DIAGNOSIS — I480 Paroxysmal atrial fibrillation: Secondary | ICD-10-CM | POA: Diagnosis not present

## 2017-11-23 DIAGNOSIS — R531 Weakness: Secondary | ICD-10-CM | POA: Diagnosis not present

## 2017-11-23 DIAGNOSIS — Z7901 Long term (current) use of anticoagulants: Secondary | ICD-10-CM | POA: Diagnosis not present

## 2017-11-23 DIAGNOSIS — I509 Heart failure, unspecified: Secondary | ICD-10-CM | POA: Diagnosis not present

## 2017-11-23 DIAGNOSIS — M1991 Primary osteoarthritis, unspecified site: Secondary | ICD-10-CM | POA: Diagnosis not present

## 2017-11-23 DIAGNOSIS — Z48815 Encounter for surgical aftercare following surgery on the digestive system: Secondary | ICD-10-CM | POA: Diagnosis not present

## 2017-11-23 DIAGNOSIS — Z86711 Personal history of pulmonary embolism: Secondary | ICD-10-CM | POA: Diagnosis not present

## 2017-11-26 DIAGNOSIS — Z86711 Personal history of pulmonary embolism: Secondary | ICD-10-CM | POA: Diagnosis not present

## 2017-11-26 DIAGNOSIS — E1142 Type 2 diabetes mellitus with diabetic polyneuropathy: Secondary | ICD-10-CM | POA: Diagnosis not present

## 2017-11-26 DIAGNOSIS — I110 Hypertensive heart disease with heart failure: Secondary | ICD-10-CM | POA: Diagnosis not present

## 2017-11-26 DIAGNOSIS — I2510 Atherosclerotic heart disease of native coronary artery without angina pectoris: Secondary | ICD-10-CM | POA: Diagnosis not present

## 2017-11-26 DIAGNOSIS — Z7901 Long term (current) use of anticoagulants: Secondary | ICD-10-CM | POA: Diagnosis not present

## 2017-11-26 DIAGNOSIS — R531 Weakness: Secondary | ICD-10-CM | POA: Diagnosis not present

## 2017-11-26 DIAGNOSIS — M1991 Primary osteoarthritis, unspecified site: Secondary | ICD-10-CM | POA: Diagnosis not present

## 2017-11-26 DIAGNOSIS — I480 Paroxysmal atrial fibrillation: Secondary | ICD-10-CM | POA: Diagnosis not present

## 2017-11-26 DIAGNOSIS — Z931 Gastrostomy status: Secondary | ICD-10-CM | POA: Diagnosis not present

## 2017-11-26 DIAGNOSIS — Z794 Long term (current) use of insulin: Secondary | ICD-10-CM | POA: Diagnosis not present

## 2017-11-26 DIAGNOSIS — E1151 Type 2 diabetes mellitus with diabetic peripheral angiopathy without gangrene: Secondary | ICD-10-CM | POA: Diagnosis not present

## 2017-11-26 DIAGNOSIS — Z8631 Personal history of diabetic foot ulcer: Secondary | ICD-10-CM | POA: Diagnosis not present

## 2017-11-26 DIAGNOSIS — Z48815 Encounter for surgical aftercare following surgery on the digestive system: Secondary | ICD-10-CM | POA: Diagnosis not present

## 2017-11-26 DIAGNOSIS — I509 Heart failure, unspecified: Secondary | ICD-10-CM | POA: Diagnosis not present

## 2017-11-26 DIAGNOSIS — Z5181 Encounter for therapeutic drug level monitoring: Secondary | ICD-10-CM | POA: Diagnosis not present

## 2017-12-01 DIAGNOSIS — I509 Heart failure, unspecified: Secondary | ICD-10-CM | POA: Diagnosis not present

## 2017-12-01 DIAGNOSIS — Z48815 Encounter for surgical aftercare following surgery on the digestive system: Secondary | ICD-10-CM | POA: Diagnosis not present

## 2017-12-01 DIAGNOSIS — Z86711 Personal history of pulmonary embolism: Secondary | ICD-10-CM | POA: Diagnosis not present

## 2017-12-01 DIAGNOSIS — Z931 Gastrostomy status: Secondary | ICD-10-CM | POA: Diagnosis not present

## 2017-12-01 DIAGNOSIS — M1991 Primary osteoarthritis, unspecified site: Secondary | ICD-10-CM | POA: Diagnosis not present

## 2017-12-01 DIAGNOSIS — Z8631 Personal history of diabetic foot ulcer: Secondary | ICD-10-CM | POA: Diagnosis not present

## 2017-12-01 DIAGNOSIS — Z7901 Long term (current) use of anticoagulants: Secondary | ICD-10-CM | POA: Diagnosis not present

## 2017-12-01 DIAGNOSIS — I480 Paroxysmal atrial fibrillation: Secondary | ICD-10-CM | POA: Diagnosis not present

## 2017-12-01 DIAGNOSIS — I2510 Atherosclerotic heart disease of native coronary artery without angina pectoris: Secondary | ICD-10-CM | POA: Diagnosis not present

## 2017-12-01 DIAGNOSIS — I110 Hypertensive heart disease with heart failure: Secondary | ICD-10-CM | POA: Diagnosis not present

## 2017-12-01 DIAGNOSIS — Z5181 Encounter for therapeutic drug level monitoring: Secondary | ICD-10-CM | POA: Diagnosis not present

## 2017-12-01 DIAGNOSIS — E1151 Type 2 diabetes mellitus with diabetic peripheral angiopathy without gangrene: Secondary | ICD-10-CM | POA: Diagnosis not present

## 2017-12-01 DIAGNOSIS — E1142 Type 2 diabetes mellitus with diabetic polyneuropathy: Secondary | ICD-10-CM | POA: Diagnosis not present

## 2017-12-01 DIAGNOSIS — Z794 Long term (current) use of insulin: Secondary | ICD-10-CM | POA: Diagnosis not present

## 2017-12-01 DIAGNOSIS — R531 Weakness: Secondary | ICD-10-CM | POA: Diagnosis not present

## 2017-12-23 DIAGNOSIS — E1151 Type 2 diabetes mellitus with diabetic peripheral angiopathy without gangrene: Secondary | ICD-10-CM | POA: Diagnosis not present

## 2017-12-23 DIAGNOSIS — Z0000 Encounter for general adult medical examination without abnormal findings: Secondary | ICD-10-CM | POA: Diagnosis not present

## 2017-12-23 DIAGNOSIS — E1165 Type 2 diabetes mellitus with hyperglycemia: Secondary | ICD-10-CM | POA: Diagnosis not present

## 2017-12-23 DIAGNOSIS — E785 Hyperlipidemia, unspecified: Secondary | ICD-10-CM | POA: Diagnosis not present

## 2017-12-23 DIAGNOSIS — Z1159 Encounter for screening for other viral diseases: Secondary | ICD-10-CM | POA: Diagnosis not present

## 2017-12-23 DIAGNOSIS — Z79899 Other long term (current) drug therapy: Secondary | ICD-10-CM | POA: Diagnosis not present

## 2017-12-23 DIAGNOSIS — E1140 Type 2 diabetes mellitus with diabetic neuropathy, unspecified: Secondary | ICD-10-CM | POA: Diagnosis not present

## 2018-01-19 DIAGNOSIS — M25562 Pain in left knee: Secondary | ICD-10-CM | POA: Diagnosis not present

## 2018-02-04 DIAGNOSIS — R5383 Other fatigue: Secondary | ICD-10-CM | POA: Diagnosis not present

## 2018-02-04 DIAGNOSIS — S40812A Abrasion of left upper arm, initial encounter: Secondary | ICD-10-CM | POA: Diagnosis not present

## 2018-02-04 DIAGNOSIS — M79602 Pain in left arm: Secondary | ICD-10-CM | POA: Diagnosis not present

## 2018-02-04 DIAGNOSIS — E119 Type 2 diabetes mellitus without complications: Secondary | ICD-10-CM | POA: Diagnosis not present

## 2018-03-03 DIAGNOSIS — E1142 Type 2 diabetes mellitus with diabetic polyneuropathy: Secondary | ICD-10-CM | POA: Diagnosis not present

## 2018-03-03 DIAGNOSIS — Z89422 Acquired absence of other left toe(s): Secondary | ICD-10-CM | POA: Diagnosis not present

## 2018-03-03 DIAGNOSIS — S98112A Complete traumatic amputation of left great toe, initial encounter: Secondary | ICD-10-CM | POA: Diagnosis not present

## 2018-03-03 DIAGNOSIS — S98111A Complete traumatic amputation of right great toe, initial encounter: Secondary | ICD-10-CM | POA: Diagnosis not present

## 2018-03-03 DIAGNOSIS — Z89421 Acquired absence of other right toe(s): Secondary | ICD-10-CM | POA: Diagnosis not present

## 2018-04-06 DIAGNOSIS — E1165 Type 2 diabetes mellitus with hyperglycemia: Secondary | ICD-10-CM | POA: Diagnosis not present

## 2018-04-06 DIAGNOSIS — I739 Peripheral vascular disease, unspecified: Secondary | ICD-10-CM | POA: Diagnosis not present

## 2018-04-06 DIAGNOSIS — Z23 Encounter for immunization: Secondary | ICD-10-CM | POA: Diagnosis not present

## 2018-04-06 DIAGNOSIS — Z79899 Other long term (current) drug therapy: Secondary | ICD-10-CM | POA: Diagnosis not present

## 2018-04-06 DIAGNOSIS — E785 Hyperlipidemia, unspecified: Secondary | ICD-10-CM | POA: Diagnosis not present

## 2018-04-06 DIAGNOSIS — E1140 Type 2 diabetes mellitus with diabetic neuropathy, unspecified: Secondary | ICD-10-CM | POA: Diagnosis not present

## 2018-04-06 DIAGNOSIS — Z89421 Acquired absence of other right toe(s): Secondary | ICD-10-CM | POA: Diagnosis not present

## 2018-04-11 DIAGNOSIS — L0390 Cellulitis, unspecified: Secondary | ICD-10-CM | POA: Diagnosis not present

## 2018-04-11 DIAGNOSIS — L0231 Cutaneous abscess of buttock: Secondary | ICD-10-CM | POA: Diagnosis not present

## 2018-04-29 DIAGNOSIS — R0789 Other chest pain: Secondary | ICD-10-CM | POA: Diagnosis not present

## 2018-04-29 DIAGNOSIS — E1165 Type 2 diabetes mellitus with hyperglycemia: Secondary | ICD-10-CM | POA: Diagnosis not present

## 2018-04-29 DIAGNOSIS — R079 Chest pain, unspecified: Secondary | ICD-10-CM | POA: Diagnosis not present

## 2018-04-29 DIAGNOSIS — I10 Essential (primary) hypertension: Secondary | ICD-10-CM | POA: Diagnosis not present

## 2018-04-29 DIAGNOSIS — Z79899 Other long term (current) drug therapy: Secondary | ICD-10-CM | POA: Diagnosis not present

## 2018-04-29 DIAGNOSIS — J40 Bronchitis, not specified as acute or chronic: Secondary | ICD-10-CM | POA: Diagnosis not present

## 2018-05-03 DIAGNOSIS — Z87891 Personal history of nicotine dependence: Secondary | ICD-10-CM | POA: Diagnosis not present

## 2018-05-03 DIAGNOSIS — D649 Anemia, unspecified: Secondary | ICD-10-CM | POA: Diagnosis not present

## 2018-05-03 DIAGNOSIS — M940 Chondrocostal junction syndrome [Tietze]: Secondary | ICD-10-CM | POA: Diagnosis not present

## 2018-05-03 DIAGNOSIS — M1990 Unspecified osteoarthritis, unspecified site: Secondary | ICD-10-CM | POA: Diagnosis not present

## 2018-05-03 DIAGNOSIS — R0682 Tachypnea, not elsewhere classified: Secondary | ICD-10-CM | POA: Diagnosis not present

## 2018-05-03 DIAGNOSIS — Z79899 Other long term (current) drug therapy: Secondary | ICD-10-CM | POA: Diagnosis not present

## 2018-05-03 DIAGNOSIS — Z86711 Personal history of pulmonary embolism: Secondary | ICD-10-CM | POA: Diagnosis not present

## 2018-05-03 DIAGNOSIS — J209 Acute bronchitis, unspecified: Secondary | ICD-10-CM | POA: Diagnosis not present

## 2018-05-03 DIAGNOSIS — E119 Type 2 diabetes mellitus without complications: Secondary | ICD-10-CM | POA: Diagnosis not present

## 2018-05-03 DIAGNOSIS — R0602 Shortness of breath: Secondary | ICD-10-CM | POA: Diagnosis not present

## 2018-05-03 DIAGNOSIS — R079 Chest pain, unspecified: Secondary | ICD-10-CM | POA: Diagnosis not present

## 2018-05-03 DIAGNOSIS — Z794 Long term (current) use of insulin: Secondary | ICD-10-CM | POA: Diagnosis not present

## 2018-05-03 DIAGNOSIS — I10 Essential (primary) hypertension: Secondary | ICD-10-CM | POA: Diagnosis not present

## 2018-05-03 DIAGNOSIS — G5700 Lesion of sciatic nerve, unspecified lower limb: Secondary | ICD-10-CM | POA: Diagnosis not present

## 2018-05-03 DIAGNOSIS — Z7901 Long term (current) use of anticoagulants: Secondary | ICD-10-CM | POA: Diagnosis not present

## 2018-05-04 DIAGNOSIS — R079 Chest pain, unspecified: Secondary | ICD-10-CM | POA: Diagnosis not present

## 2018-05-04 DIAGNOSIS — R0602 Shortness of breath: Secondary | ICD-10-CM | POA: Diagnosis not present

## 2018-05-27 DIAGNOSIS — Z961 Presence of intraocular lens: Secondary | ICD-10-CM | POA: Diagnosis not present

## 2018-05-27 DIAGNOSIS — H524 Presbyopia: Secondary | ICD-10-CM | POA: Diagnosis not present

## 2018-05-27 DIAGNOSIS — H43813 Vitreous degeneration, bilateral: Secondary | ICD-10-CM | POA: Diagnosis not present

## 2018-05-27 DIAGNOSIS — D3132 Benign neoplasm of left choroid: Secondary | ICD-10-CM | POA: Diagnosis not present

## 2018-05-27 DIAGNOSIS — E119 Type 2 diabetes mellitus without complications: Secondary | ICD-10-CM | POA: Diagnosis not present

## 2018-05-28 DIAGNOSIS — H5213 Myopia, bilateral: Secondary | ICD-10-CM | POA: Diagnosis not present

## 2018-06-14 DIAGNOSIS — L02211 Cutaneous abscess of abdominal wall: Secondary | ICD-10-CM | POA: Diagnosis not present

## 2018-06-21 DIAGNOSIS — Z89421 Acquired absence of other right toe(s): Secondary | ICD-10-CM | POA: Diagnosis not present

## 2018-06-21 DIAGNOSIS — E1142 Type 2 diabetes mellitus with diabetic polyneuropathy: Secondary | ICD-10-CM | POA: Diagnosis not present

## 2018-06-21 DIAGNOSIS — L02611 Cutaneous abscess of right foot: Secondary | ICD-10-CM | POA: Diagnosis not present

## 2018-06-21 DIAGNOSIS — Z89422 Acquired absence of other left toe(s): Secondary | ICD-10-CM | POA: Diagnosis not present

## 2018-06-22 DIAGNOSIS — L02611 Cutaneous abscess of right foot: Secondary | ICD-10-CM | POA: Diagnosis not present

## 2018-07-21 DIAGNOSIS — Z89422 Acquired absence of other left toe(s): Secondary | ICD-10-CM | POA: Diagnosis not present

## 2018-07-21 DIAGNOSIS — Z89421 Acquired absence of other right toe(s): Secondary | ICD-10-CM | POA: Diagnosis not present

## 2018-07-21 DIAGNOSIS — E1142 Type 2 diabetes mellitus with diabetic polyneuropathy: Secondary | ICD-10-CM | POA: Diagnosis not present

## 2018-07-21 DIAGNOSIS — E11621 Type 2 diabetes mellitus with foot ulcer: Secondary | ICD-10-CM | POA: Diagnosis not present

## 2018-07-21 DIAGNOSIS — L97412 Non-pressure chronic ulcer of right heel and midfoot with fat layer exposed: Secondary | ICD-10-CM | POA: Diagnosis not present

## 2018-08-19 DIAGNOSIS — S91101A Unspecified open wound of right great toe without damage to nail, initial encounter: Secondary | ICD-10-CM | POA: Diagnosis not present

## 2018-08-19 DIAGNOSIS — L97511 Non-pressure chronic ulcer of other part of right foot limited to breakdown of skin: Secondary | ICD-10-CM | POA: Diagnosis not present

## 2018-08-19 DIAGNOSIS — Z794 Long term (current) use of insulin: Secondary | ICD-10-CM | POA: Diagnosis not present

## 2018-08-19 DIAGNOSIS — R200 Anesthesia of skin: Secondary | ICD-10-CM | POA: Diagnosis not present

## 2018-08-19 DIAGNOSIS — E08621 Diabetes mellitus due to underlying condition with foot ulcer: Secondary | ICD-10-CM | POA: Diagnosis not present

## 2018-08-23 DIAGNOSIS — E11621 Type 2 diabetes mellitus with foot ulcer: Secondary | ICD-10-CM | POA: Diagnosis not present

## 2018-08-23 DIAGNOSIS — Z89421 Acquired absence of other right toe(s): Secondary | ICD-10-CM | POA: Diagnosis not present

## 2018-08-23 DIAGNOSIS — Z89422 Acquired absence of other left toe(s): Secondary | ICD-10-CM | POA: Diagnosis not present

## 2018-08-23 DIAGNOSIS — E1142 Type 2 diabetes mellitus with diabetic polyneuropathy: Secondary | ICD-10-CM | POA: Diagnosis not present

## 2018-08-23 DIAGNOSIS — L97415 Non-pressure chronic ulcer of right heel and midfoot with muscle involvement without evidence of necrosis: Secondary | ICD-10-CM | POA: Diagnosis not present

## 2018-08-30 DIAGNOSIS — E11622 Type 2 diabetes mellitus with other skin ulcer: Secondary | ICD-10-CM | POA: Diagnosis not present

## 2018-08-30 DIAGNOSIS — Z89412 Acquired absence of left great toe: Secondary | ICD-10-CM | POA: Diagnosis not present

## 2018-08-30 DIAGNOSIS — M79671 Pain in right foot: Secondary | ICD-10-CM | POA: Diagnosis not present

## 2018-08-30 DIAGNOSIS — Z79899 Other long term (current) drug therapy: Secondary | ICD-10-CM | POA: Diagnosis not present

## 2018-08-30 DIAGNOSIS — I10 Essential (primary) hypertension: Secondary | ICD-10-CM | POA: Diagnosis not present

## 2018-08-30 DIAGNOSIS — Z794 Long term (current) use of insulin: Secondary | ICD-10-CM | POA: Diagnosis not present

## 2018-08-30 DIAGNOSIS — Z7901 Long term (current) use of anticoagulants: Secondary | ICD-10-CM | POA: Diagnosis not present

## 2018-08-30 DIAGNOSIS — E11621 Type 2 diabetes mellitus with foot ulcer: Secondary | ICD-10-CM | POA: Diagnosis not present

## 2018-08-30 DIAGNOSIS — Z89421 Acquired absence of other right toe(s): Secondary | ICD-10-CM | POA: Diagnosis not present

## 2018-08-30 DIAGNOSIS — Z89422 Acquired absence of other left toe(s): Secondary | ICD-10-CM | POA: Diagnosis not present

## 2018-08-30 DIAGNOSIS — L97519 Non-pressure chronic ulcer of other part of right foot with unspecified severity: Secondary | ICD-10-CM | POA: Diagnosis not present

## 2018-09-14 DIAGNOSIS — L97519 Non-pressure chronic ulcer of other part of right foot with unspecified severity: Secondary | ICD-10-CM | POA: Diagnosis not present

## 2018-09-14 DIAGNOSIS — I10 Essential (primary) hypertension: Secondary | ICD-10-CM | POA: Diagnosis not present

## 2018-09-14 DIAGNOSIS — E1169 Type 2 diabetes mellitus with other specified complication: Secondary | ICD-10-CM | POA: Diagnosis not present

## 2018-09-14 DIAGNOSIS — M86171 Other acute osteomyelitis, right ankle and foot: Secondary | ICD-10-CM | POA: Diagnosis not present

## 2018-09-14 DIAGNOSIS — Z452 Encounter for adjustment and management of vascular access device: Secondary | ICD-10-CM | POA: Diagnosis not present

## 2018-09-14 DIAGNOSIS — Z86711 Personal history of pulmonary embolism: Secondary | ICD-10-CM | POA: Diagnosis not present

## 2018-09-14 DIAGNOSIS — B954 Other streptococcus as the cause of diseases classified elsewhere: Secondary | ICD-10-CM | POA: Diagnosis not present

## 2018-09-14 DIAGNOSIS — L03115 Cellulitis of right lower limb: Secondary | ICD-10-CM | POA: Diagnosis not present

## 2018-09-14 DIAGNOSIS — Z89412 Acquired absence of left great toe: Secondary | ICD-10-CM | POA: Diagnosis not present

## 2018-09-14 DIAGNOSIS — E1142 Type 2 diabetes mellitus with diabetic polyneuropathy: Secondary | ICD-10-CM | POA: Diagnosis not present

## 2018-09-14 DIAGNOSIS — L02611 Cutaneous abscess of right foot: Secondary | ICD-10-CM | POA: Diagnosis not present

## 2018-09-14 DIAGNOSIS — Z9049 Acquired absence of other specified parts of digestive tract: Secondary | ICD-10-CM | POA: Diagnosis not present

## 2018-09-14 DIAGNOSIS — I96 Gangrene, not elsewhere classified: Secondary | ICD-10-CM | POA: Diagnosis not present

## 2018-09-14 DIAGNOSIS — M868X7 Other osteomyelitis, ankle and foot: Secondary | ICD-10-CM | POA: Diagnosis not present

## 2018-09-14 DIAGNOSIS — I480 Paroxysmal atrial fibrillation: Secondary | ICD-10-CM | POA: Diagnosis not present

## 2018-09-14 DIAGNOSIS — E11621 Type 2 diabetes mellitus with foot ulcer: Secondary | ICD-10-CM | POA: Diagnosis not present

## 2018-09-14 DIAGNOSIS — I482 Chronic atrial fibrillation, unspecified: Secondary | ICD-10-CM | POA: Diagnosis not present

## 2018-09-14 DIAGNOSIS — L03119 Cellulitis of unspecified part of limb: Secondary | ICD-10-CM | POA: Diagnosis not present

## 2018-09-14 DIAGNOSIS — E1165 Type 2 diabetes mellitus with hyperglycemia: Secondary | ICD-10-CM | POA: Diagnosis not present

## 2018-09-14 DIAGNOSIS — Z89429 Acquired absence of other toe(s), unspecified side: Secondary | ICD-10-CM | POA: Diagnosis not present

## 2018-09-14 DIAGNOSIS — Z794 Long term (current) use of insulin: Secondary | ICD-10-CM | POA: Diagnosis not present

## 2018-09-14 DIAGNOSIS — L97511 Non-pressure chronic ulcer of other part of right foot limited to breakdown of skin: Secondary | ICD-10-CM | POA: Diagnosis not present

## 2018-09-14 DIAGNOSIS — Z7901 Long term (current) use of anticoagulants: Secondary | ICD-10-CM | POA: Diagnosis not present

## 2018-09-14 DIAGNOSIS — Z89411 Acquired absence of right great toe: Secondary | ICD-10-CM | POA: Diagnosis not present

## 2018-09-14 DIAGNOSIS — L97512 Non-pressure chronic ulcer of other part of right foot with fat layer exposed: Secondary | ICD-10-CM | POA: Diagnosis not present

## 2018-09-14 DIAGNOSIS — E871 Hypo-osmolality and hyponatremia: Secondary | ICD-10-CM | POA: Diagnosis not present

## 2018-09-14 DIAGNOSIS — B9562 Methicillin resistant Staphylococcus aureus infection as the cause of diseases classified elsewhere: Secondary | ICD-10-CM | POA: Diagnosis not present

## 2018-09-22 DIAGNOSIS — M869 Osteomyelitis, unspecified: Secondary | ICD-10-CM | POA: Diagnosis not present

## 2018-09-27 DIAGNOSIS — I739 Peripheral vascular disease, unspecified: Secondary | ICD-10-CM | POA: Diagnosis not present

## 2018-09-27 DIAGNOSIS — E1151 Type 2 diabetes mellitus with diabetic peripheral angiopathy without gangrene: Secondary | ICD-10-CM | POA: Diagnosis not present

## 2018-09-27 DIAGNOSIS — M869 Osteomyelitis, unspecified: Secondary | ICD-10-CM | POA: Diagnosis not present

## 2018-09-27 DIAGNOSIS — E1165 Type 2 diabetes mellitus with hyperglycemia: Secondary | ICD-10-CM | POA: Diagnosis not present

## 2018-09-27 DIAGNOSIS — Z89431 Acquired absence of right foot: Secondary | ICD-10-CM | POA: Diagnosis not present

## 2018-09-27 DIAGNOSIS — E1140 Type 2 diabetes mellitus with diabetic neuropathy, unspecified: Secondary | ICD-10-CM | POA: Diagnosis not present

## 2018-10-01 DIAGNOSIS — T8781 Dehiscence of amputation stump: Secondary | ICD-10-CM | POA: Diagnosis not present

## 2018-10-01 DIAGNOSIS — I10 Essential (primary) hypertension: Secondary | ICD-10-CM | POA: Diagnosis not present

## 2018-10-01 DIAGNOSIS — Z794 Long term (current) use of insulin: Secondary | ICD-10-CM | POA: Diagnosis not present

## 2018-10-01 DIAGNOSIS — M1990 Unspecified osteoarthritis, unspecified site: Secondary | ICD-10-CM | POA: Diagnosis not present

## 2018-10-01 DIAGNOSIS — K5790 Diverticulosis of intestine, part unspecified, without perforation or abscess without bleeding: Secondary | ICD-10-CM | POA: Diagnosis not present

## 2018-10-01 DIAGNOSIS — M869 Osteomyelitis, unspecified: Secondary | ICD-10-CM | POA: Diagnosis not present

## 2018-10-01 DIAGNOSIS — E119 Type 2 diabetes mellitus without complications: Secondary | ICD-10-CM | POA: Diagnosis not present

## 2018-10-01 DIAGNOSIS — I4891 Unspecified atrial fibrillation: Secondary | ICD-10-CM | POA: Diagnosis not present

## 2018-10-04 DIAGNOSIS — M869 Osteomyelitis, unspecified: Secondary | ICD-10-CM | POA: Diagnosis not present

## 2018-10-05 DIAGNOSIS — Z794 Long term (current) use of insulin: Secondary | ICD-10-CM | POA: Diagnosis not present

## 2018-10-05 DIAGNOSIS — M1990 Unspecified osteoarthritis, unspecified site: Secondary | ICD-10-CM | POA: Diagnosis not present

## 2018-10-05 DIAGNOSIS — E119 Type 2 diabetes mellitus without complications: Secondary | ICD-10-CM | POA: Diagnosis not present

## 2018-10-05 DIAGNOSIS — I10 Essential (primary) hypertension: Secondary | ICD-10-CM | POA: Diagnosis not present

## 2018-10-05 DIAGNOSIS — K5790 Diverticulosis of intestine, part unspecified, without perforation or abscess without bleeding: Secondary | ICD-10-CM | POA: Diagnosis not present

## 2018-10-05 DIAGNOSIS — T8781 Dehiscence of amputation stump: Secondary | ICD-10-CM | POA: Diagnosis not present

## 2018-10-05 DIAGNOSIS — I4891 Unspecified atrial fibrillation: Secondary | ICD-10-CM | POA: Diagnosis not present

## 2018-10-06 DIAGNOSIS — T8781 Dehiscence of amputation stump: Secondary | ICD-10-CM | POA: Diagnosis not present

## 2018-10-09 DIAGNOSIS — M869 Osteomyelitis, unspecified: Secondary | ICD-10-CM | POA: Diagnosis not present

## 2018-10-11 DIAGNOSIS — M869 Osteomyelitis, unspecified: Secondary | ICD-10-CM | POA: Diagnosis not present

## 2018-10-12 DIAGNOSIS — L03115 Cellulitis of right lower limb: Secondary | ICD-10-CM | POA: Diagnosis not present

## 2018-10-12 DIAGNOSIS — I4891 Unspecified atrial fibrillation: Secondary | ICD-10-CM | POA: Diagnosis not present

## 2018-10-12 DIAGNOSIS — T8781 Dehiscence of amputation stump: Secondary | ICD-10-CM | POA: Diagnosis not present

## 2018-10-12 DIAGNOSIS — T3695XA Adverse effect of unspecified systemic antibiotic, initial encounter: Secondary | ICD-10-CM | POA: Diagnosis not present

## 2018-10-12 DIAGNOSIS — K5790 Diverticulosis of intestine, part unspecified, without perforation or abscess without bleeding: Secondary | ICD-10-CM | POA: Diagnosis not present

## 2018-10-12 DIAGNOSIS — M1990 Unspecified osteoarthritis, unspecified site: Secondary | ICD-10-CM | POA: Diagnosis not present

## 2018-10-12 DIAGNOSIS — L02611 Cutaneous abscess of right foot: Secondary | ICD-10-CM | POA: Diagnosis not present

## 2018-10-12 DIAGNOSIS — Z794 Long term (current) use of insulin: Secondary | ICD-10-CM | POA: Diagnosis not present

## 2018-10-12 DIAGNOSIS — E119 Type 2 diabetes mellitus without complications: Secondary | ICD-10-CM | POA: Diagnosis not present

## 2018-10-12 DIAGNOSIS — I10 Essential (primary) hypertension: Secondary | ICD-10-CM | POA: Diagnosis not present

## 2018-10-15 DIAGNOSIS — T8781 Dehiscence of amputation stump: Secondary | ICD-10-CM | POA: Diagnosis not present

## 2018-10-15 DIAGNOSIS — M1990 Unspecified osteoarthritis, unspecified site: Secondary | ICD-10-CM | POA: Diagnosis not present

## 2018-10-15 DIAGNOSIS — I10 Essential (primary) hypertension: Secondary | ICD-10-CM | POA: Diagnosis not present

## 2018-10-15 DIAGNOSIS — Z794 Long term (current) use of insulin: Secondary | ICD-10-CM | POA: Diagnosis not present

## 2018-10-15 DIAGNOSIS — E119 Type 2 diabetes mellitus without complications: Secondary | ICD-10-CM | POA: Diagnosis not present

## 2018-10-15 DIAGNOSIS — I4891 Unspecified atrial fibrillation: Secondary | ICD-10-CM | POA: Diagnosis not present

## 2018-10-15 DIAGNOSIS — K5790 Diverticulosis of intestine, part unspecified, without perforation or abscess without bleeding: Secondary | ICD-10-CM | POA: Diagnosis not present

## 2018-11-17 DIAGNOSIS — Z791 Long term (current) use of non-steroidal anti-inflammatories (NSAID): Secondary | ICD-10-CM | POA: Diagnosis not present

## 2018-11-17 DIAGNOSIS — Z79899 Other long term (current) drug therapy: Secondary | ICD-10-CM | POA: Diagnosis not present

## 2018-11-17 DIAGNOSIS — M1991 Primary osteoarthritis, unspecified site: Secondary | ICD-10-CM | POA: Diagnosis not present

## 2018-11-17 DIAGNOSIS — Z794 Long term (current) use of insulin: Secondary | ICD-10-CM | POA: Diagnosis not present

## 2018-11-17 DIAGNOSIS — R2242 Localized swelling, mass and lump, left lower limb: Secondary | ICD-10-CM | POA: Diagnosis not present

## 2018-11-17 DIAGNOSIS — E119 Type 2 diabetes mellitus without complications: Secondary | ICD-10-CM | POA: Diagnosis not present

## 2018-11-17 DIAGNOSIS — I10 Essential (primary) hypertension: Secondary | ICD-10-CM | POA: Diagnosis not present

## 2018-11-17 DIAGNOSIS — Z7901 Long term (current) use of anticoagulants: Secondary | ICD-10-CM | POA: Diagnosis not present

## 2018-11-17 DIAGNOSIS — M25562 Pain in left knee: Secondary | ICD-10-CM | POA: Diagnosis not present

## 2018-11-17 DIAGNOSIS — M25462 Effusion, left knee: Secondary | ICD-10-CM | POA: Diagnosis not present

## 2018-11-17 DIAGNOSIS — M1712 Unilateral primary osteoarthritis, left knee: Secondary | ICD-10-CM | POA: Diagnosis not present

## 2018-11-23 ENCOUNTER — Other Ambulatory Visit: Payer: Self-pay

## 2018-11-23 NOTE — Patient Outreach (Signed)
[facility]) Care Management  11/23/2018  Danny Mason 1960/07/26 [id number]   Telephone Screen  Referral Date: 11/22/2018 Referral Source: [facility] Dept. Referral Reason: "needs left knee replacement, ortho surgeon appt next week, afraid to have surgery because lives alone and will not have help-any resources to help? Insurance: [facility] Medicare   Outreach attempt # 1 to patient. Spoke with patient. RN CM discussed and reviewed referral source and reason with patient. He reports he does not know why the person he spoke with sent over referral to [facility] as he knows what to do. Patient voices that he has had knee replacement in the past and knows what to expect. He reports that last time he did a short rehab stay and then was sent home with [facility] services. He is expecting the same thing to happen this time. He goes later this week for ortho follow up appt and to discuss when surgery will be performed. Patient already plans to discuss this with surgeon and develop a post-op plan. He denies any [facility] needs or concerns at present. He was advised that [facility] does not provide hands on in home services which he feels like he will need. He is aware of [facility] services that are provided and advised to call for any future needs or concerns.     Plan: RN CM will close case at this time.    Enzo Montgomery, RN,BSN,CCM [place] [facility] Telephonic Care Management Coordinator Direct Phone: [phone] Toll Free: [phone] Fax: [phone]

## 2018-11-25 DIAGNOSIS — M1712 Unilateral primary osteoarthritis, left knee: Secondary | ICD-10-CM | POA: Diagnosis not present

## 2018-12-06 ENCOUNTER — Other Ambulatory Visit: Payer: Self-pay

## 2018-12-06 NOTE — Patient Outreach (Signed)
New referral for [facility] screening:  Upon chart review, patient was contacted by another [facility] case manager and declined services.  PLAN: case will remain closed and this case manager will not outreach.  Tomasa Rand, RN, BSN, CEN [facility] [place] [employer] [phone]

## 2018-12-10 ENCOUNTER — Other Ambulatory Visit: Payer: Self-pay | Admitting: *Deleted

## 2018-12-10 NOTE — Patient Outreach (Signed)
[name]  12/10/2018  Danny Mason 02-07-1961 [id number]   Telephone Screen  Referral Date:  12/10/2018 Referral Source:  [name] Reason for Referral:  Assessing Needs Insurance:  [name]   Outreach Attempt:  Patient has previously been screened by [place] Coordinator and had declined [name] services within the month of June.  Plan:  RN Health Coach will make patient inactive with [facility] at this time due to previously declining services.   [name] [phone] [email or web address]

## 2018-12-12 DIAGNOSIS — N183 Chronic kidney disease, stage 3 (moderate): Secondary | ICD-10-CM | POA: Diagnosis not present

## 2018-12-12 DIAGNOSIS — K5730 Diverticulosis of large intestine without perforation or abscess without bleeding: Secondary | ICD-10-CM | POA: Diagnosis not present

## 2018-12-12 DIAGNOSIS — R1030 Lower abdominal pain, unspecified: Secondary | ICD-10-CM | POA: Diagnosis not present

## 2018-12-12 DIAGNOSIS — D649 Anemia, unspecified: Secondary | ICD-10-CM | POA: Diagnosis not present

## 2018-12-12 DIAGNOSIS — L539 Erythematous condition, unspecified: Secondary | ICD-10-CM | POA: Diagnosis not present

## 2018-12-12 DIAGNOSIS — R079 Chest pain, unspecified: Secondary | ICD-10-CM | POA: Diagnosis not present

## 2018-12-12 DIAGNOSIS — I129 Hypertensive chronic kidney disease with stage 1 through stage 4 chronic kidney disease, or unspecified chronic kidney disease: Secondary | ICD-10-CM | POA: Diagnosis not present

## 2018-12-12 DIAGNOSIS — Z794 Long term (current) use of insulin: Secondary | ICD-10-CM | POA: Diagnosis not present

## 2018-12-12 DIAGNOSIS — K6389 Other specified diseases of intestine: Secondary | ICD-10-CM | POA: Diagnosis not present

## 2018-12-12 DIAGNOSIS — K439 Ventral hernia without obstruction or gangrene: Secondary | ICD-10-CM | POA: Diagnosis not present

## 2018-12-12 DIAGNOSIS — R918 Other nonspecific abnormal finding of lung field: Secondary | ICD-10-CM | POA: Diagnosis not present

## 2018-12-12 DIAGNOSIS — I480 Paroxysmal atrial fibrillation: Secondary | ICD-10-CM | POA: Diagnosis not present

## 2018-12-12 DIAGNOSIS — Z7901 Long term (current) use of anticoagulants: Secondary | ICD-10-CM | POA: Diagnosis not present

## 2018-12-12 DIAGNOSIS — N179 Acute kidney failure, unspecified: Secondary | ICD-10-CM | POA: Diagnosis not present

## 2018-12-12 DIAGNOSIS — Z79899 Other long term (current) drug therapy: Secondary | ICD-10-CM | POA: Diagnosis not present

## 2018-12-12 DIAGNOSIS — L03311 Cellulitis of abdominal wall: Secondary | ICD-10-CM | POA: Diagnosis not present

## 2018-12-12 DIAGNOSIS — E1122 Type 2 diabetes mellitus with diabetic chronic kidney disease: Secondary | ICD-10-CM | POA: Diagnosis not present

## 2018-12-12 DIAGNOSIS — R9431 Abnormal electrocardiogram [ECG] [EKG]: Secondary | ICD-10-CM | POA: Diagnosis not present

## 2018-12-12 DIAGNOSIS — R0602 Shortness of breath: Secondary | ICD-10-CM | POA: Diagnosis not present

## 2018-12-13 DIAGNOSIS — K6389 Other specified diseases of intestine: Secondary | ICD-10-CM | POA: Diagnosis not present

## 2018-12-13 DIAGNOSIS — R918 Other nonspecific abnormal finding of lung field: Secondary | ICD-10-CM | POA: Diagnosis not present

## 2018-12-13 DIAGNOSIS — Z7901 Long term (current) use of anticoagulants: Secondary | ICD-10-CM | POA: Diagnosis not present

## 2018-12-13 DIAGNOSIS — R0602 Shortness of breath: Secondary | ICD-10-CM | POA: Diagnosis not present

## 2018-12-13 DIAGNOSIS — N179 Acute kidney failure, unspecified: Secondary | ICD-10-CM | POA: Diagnosis not present

## 2018-12-13 DIAGNOSIS — E1159 Type 2 diabetes mellitus with other circulatory complications: Secondary | ICD-10-CM | POA: Diagnosis not present

## 2018-12-13 DIAGNOSIS — L03311 Cellulitis of abdominal wall: Secondary | ICD-10-CM | POA: Diagnosis not present

## 2018-12-13 DIAGNOSIS — K439 Ventral hernia without obstruction or gangrene: Secondary | ICD-10-CM | POA: Diagnosis not present

## 2018-12-13 DIAGNOSIS — R079 Chest pain, unspecified: Secondary | ICD-10-CM | POA: Diagnosis not present

## 2018-12-13 DIAGNOSIS — R1030 Lower abdominal pain, unspecified: Secondary | ICD-10-CM | POA: Diagnosis not present

## 2018-12-13 DIAGNOSIS — D649 Anemia, unspecified: Secondary | ICD-10-CM | POA: Diagnosis not present

## 2018-12-13 DIAGNOSIS — K5730 Diverticulosis of large intestine without perforation or abscess without bleeding: Secondary | ICD-10-CM | POA: Diagnosis not present

## 2018-12-14 DIAGNOSIS — L03311 Cellulitis of abdominal wall: Secondary | ICD-10-CM | POA: Diagnosis not present

## 2018-12-31 DIAGNOSIS — E1165 Type 2 diabetes mellitus with hyperglycemia: Secondary | ICD-10-CM | POA: Diagnosis not present

## 2018-12-31 DIAGNOSIS — E1142 Type 2 diabetes mellitus with diabetic polyneuropathy: Secondary | ICD-10-CM | POA: Diagnosis not present

## 2018-12-31 DIAGNOSIS — E1151 Type 2 diabetes mellitus with diabetic peripheral angiopathy without gangrene: Secondary | ICD-10-CM | POA: Diagnosis not present

## 2018-12-31 DIAGNOSIS — E1140 Type 2 diabetes mellitus with diabetic neuropathy, unspecified: Secondary | ICD-10-CM | POA: Diagnosis not present

## 2018-12-31 DIAGNOSIS — L03311 Cellulitis of abdominal wall: Secondary | ICD-10-CM | POA: Diagnosis not present

## 2019-01-19 DIAGNOSIS — L03311 Cellulitis of abdominal wall: Secondary | ICD-10-CM | POA: Diagnosis not present

## 2019-01-19 DIAGNOSIS — E1140 Type 2 diabetes mellitus with diabetic neuropathy, unspecified: Secondary | ICD-10-CM | POA: Diagnosis not present

## 2019-01-19 DIAGNOSIS — E1151 Type 2 diabetes mellitus with diabetic peripheral angiopathy without gangrene: Secondary | ICD-10-CM | POA: Diagnosis not present

## 2019-01-19 DIAGNOSIS — E1165 Type 2 diabetes mellitus with hyperglycemia: Secondary | ICD-10-CM | POA: Diagnosis not present

## 2019-01-29 IMAGING — CT CT ABD-PELV W/ CM
2 of 5 series · 13 of 46 positions shown, 15 images · IV contrast (iopamidol)
Comparison: None.

CLINICAL DATA: Abdominal pain with nausea and vomiting

By report, recent pancreatic necrosectomy and cholecystectomy.
EXAM:
CT ABDOMEN AND PELVIS WITH CONTRAST
TECHNIQUE: Multidetector CT imaging of the abdomen and pelvis was performed
using the standard protocol following bolus administration of
intravenous contrast.
CONTRAST:  <See Chart> OWOFZ8-CCC IOPAMIDOL (OWOFZ8-CCC) INJECTION
61%

[Series 3: abd/ pelvis 5.0 i30f 2 · axial · 0.96mm/px · z∈[-208,+282]mm · 10 of 112 slices shown, 12 images]
[im 7/112  soft-tissue]
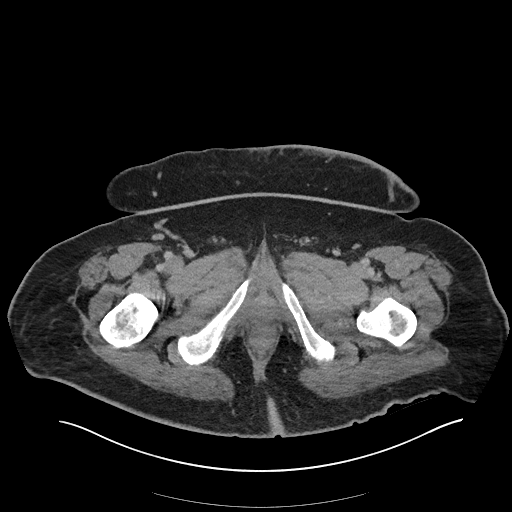
[im 7/112  bone]
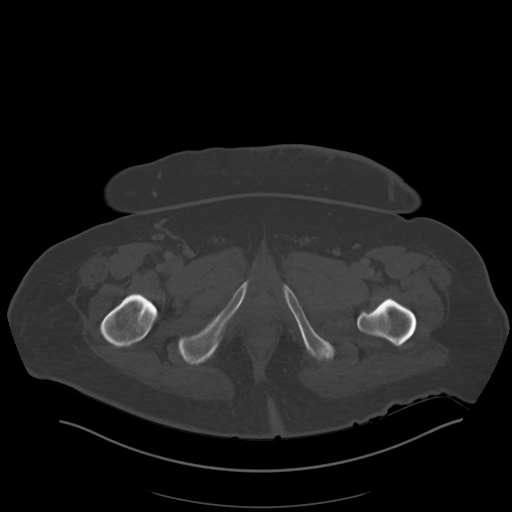
[im 20/112  soft-tissue]
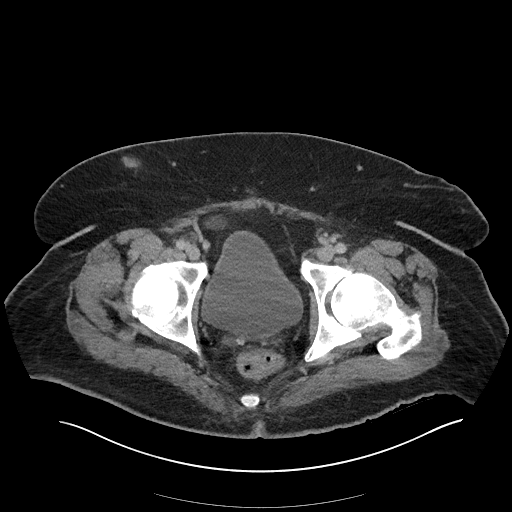
[im 33/112  soft-tissue]
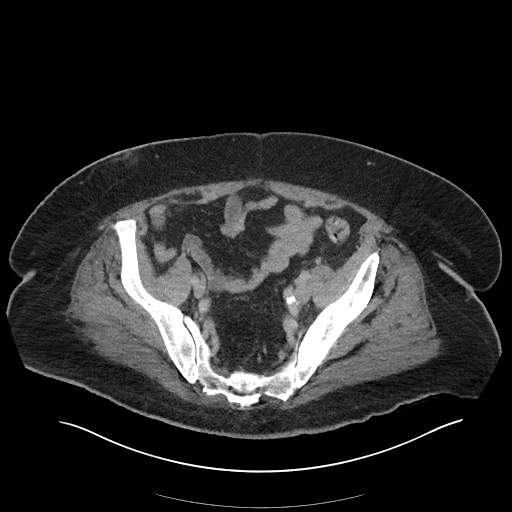
[im 40/112  soft-tissue]
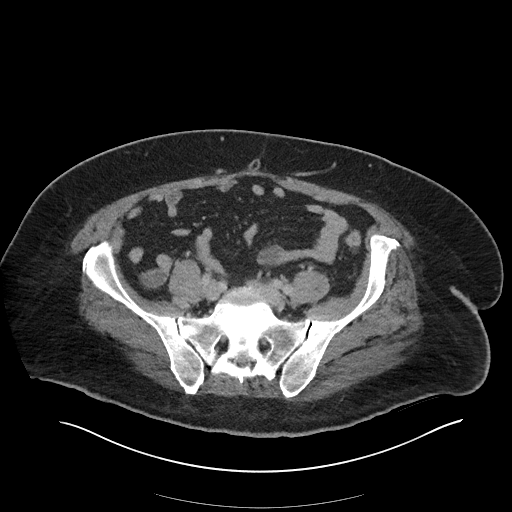
[im 53/112  soft-tissue]
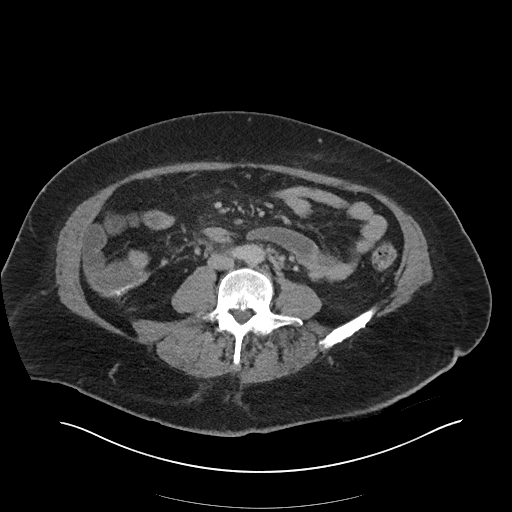
[im 59/112  soft-tissue]
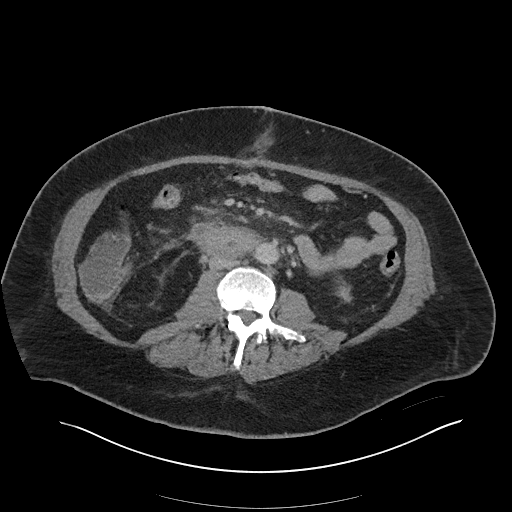
[im 72/112  soft-tissue]
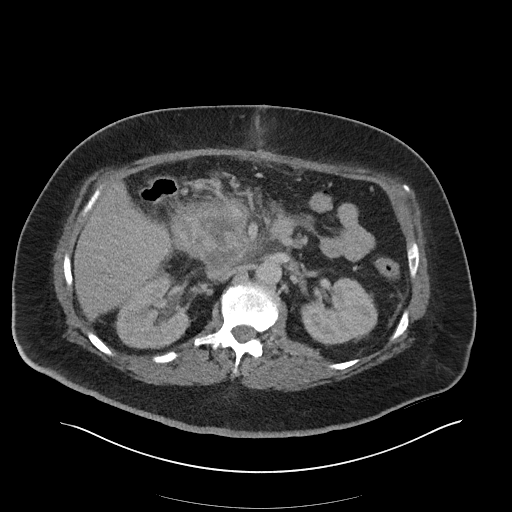
[im 85/112  soft-tissue]
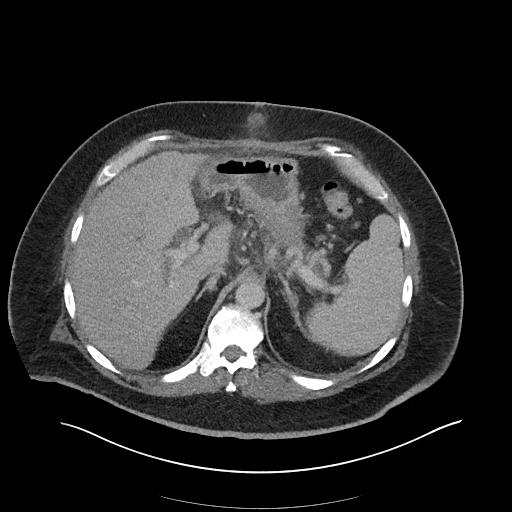
[im 92/112  soft-tissue]
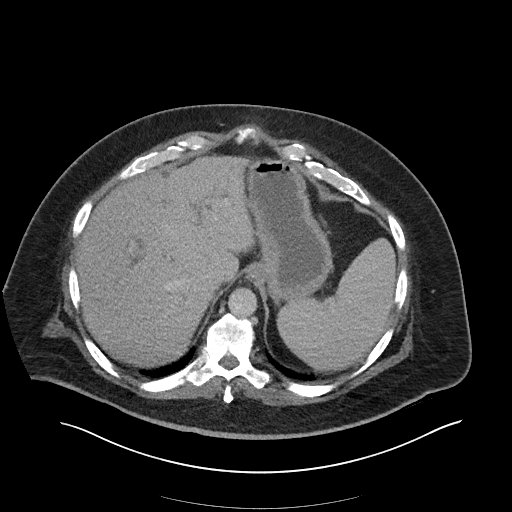
[im 92/112  bone]
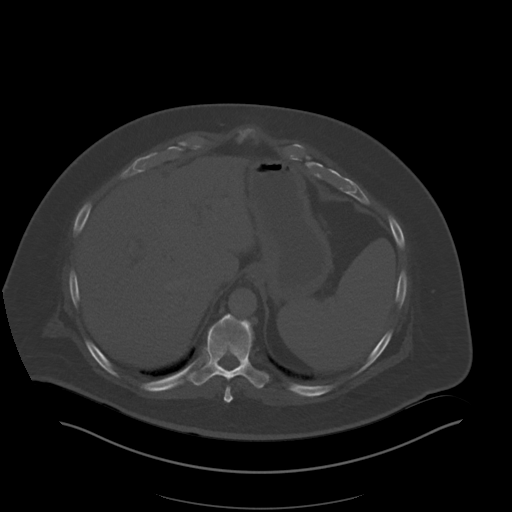
[im 105/112  soft-tissue]
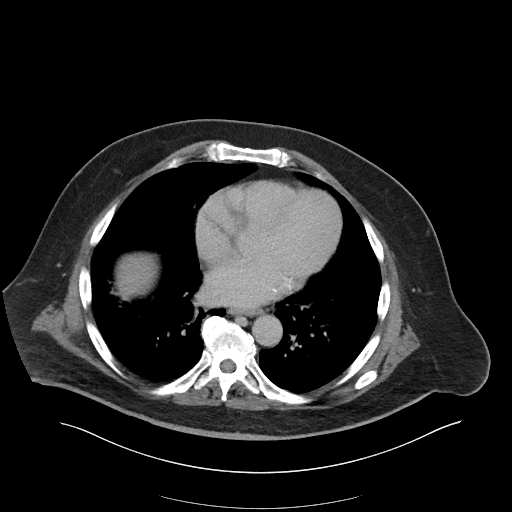

[Series 6: coronal soft tissue · coronal · 0.83mm/px · 3 of 106 slices shown]
[im 36/106  soft-tissue]
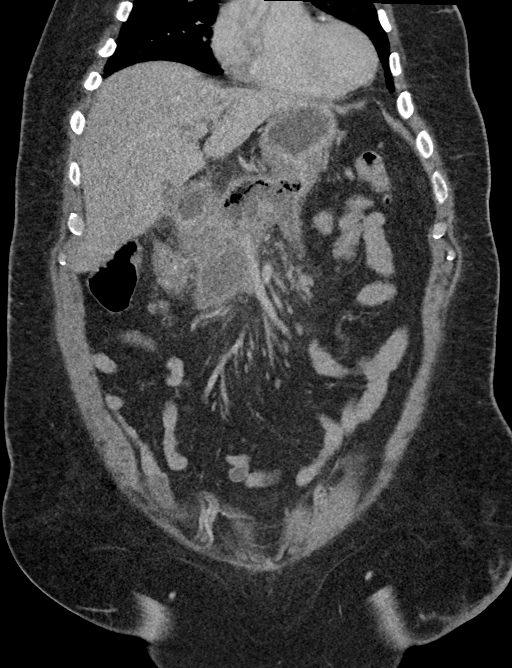
[im 47/106  soft-tissue]
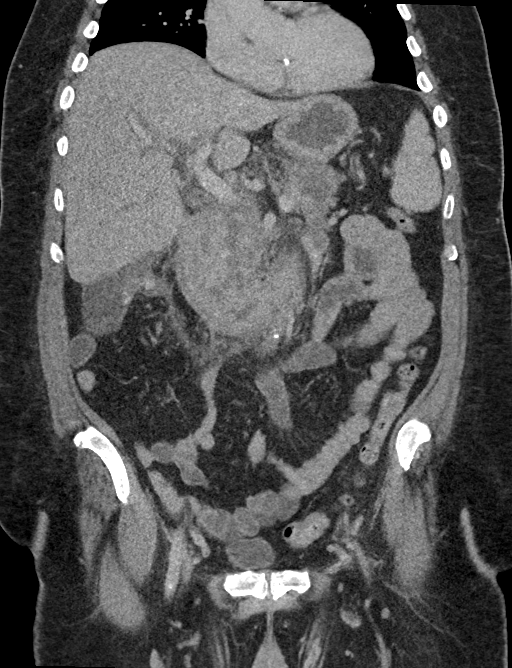
[im 59/106  soft-tissue]
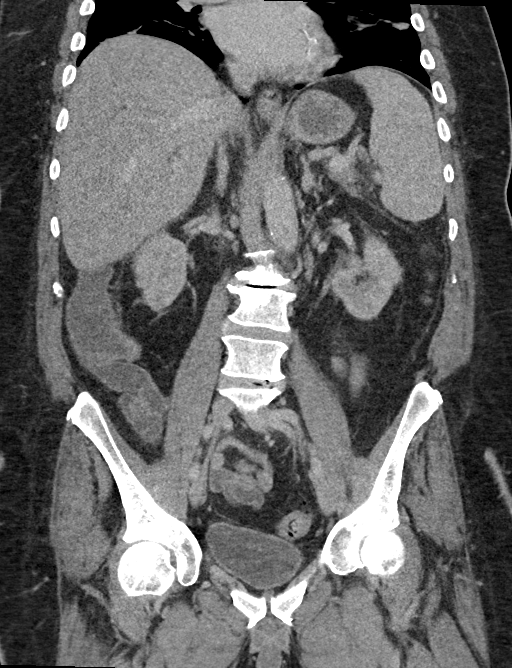

[13 of 46 positions shown; findings below may reference images not displayed]

FINDINGS: Lower chest: There is bibasilar atelectatic change. There are
multiple foci of coronary artery calcification.

Hepatobiliary: No focal liver lesions are appreciable. Gallbladder
is absent. There is moderate intrahepatic biliary duct dilatation.
There is mild dilatation of the common bile duct 12 mm. No biliary
duct mass or calculus evident.

Pancreas: There is air within the head and body of the pancreas
consistent with necrosis. There is loss of enhancement throughout
most of the head and body of the pancreas with diffuse decreased
attenuation in this area. There is edema in the head and uncinate
process of the pancreas with multiple areas of decreased
attenuation, felt to represent pancreatic necrosis with surrounding
acutely inflamed pancreatic tissue. Much of the tail the pancreas
appears normal. There is moderate stranding in the region of the
pancreatic head and body as well as posteriorly adjacent to the
uncinate process. There is no significant peripancreatic fluid.

Spleen: Spleen measures 15.1 x 14.2 x 8.0 cm with a measured splenic
volume of 858 cubic cm. No focal splenic lesions are evident.

Adrenals/Urinary Tract: Adrenals bilaterally appear unremarkable.
Kidneys bilaterally show no evident mass or hydronephrosis on either
side. There is no renal or ureteral calculus on either side. Urinary
bladder is midline with wall thickness within normal limits.

Stomach/Bowel: There are multiple sigmoid diverticula without
diverticulitis. There is mild thickening of the wall of the
ascending colon due to proximity to inflamed pancreatic head. There
is extensive thickening of the wall of the distal stomach and
duodenum due to its proximity with the inflamed pancreas. Elsewhere
there is no appreciable bowel wall thickening.. There is no
appreciable bowel obstruction. No free air or portal venous air.

Vascular/Lymphatic: There are foci of atherosclerotic calcification
in the aorta and common iliac arteries. No aneurysm. Major
mesenteric vessels appear patent. There are scattered peripancreatic
lymph nodes likely due to the inflammation in this area. No frank
adenopathy is evident size criteria in the abdomen or pelvis.

Reproductive: Prostate and seminal vesicles appear normal in size
and contour. No pelvic mass.

Other: There is no periappendiceal region inflammation. No ascites
is evident in the abdomen or pelvis. There is no abscess in the
abdomen or pelvis beyond the areas of pancreatic necrosis and
inflammation. There is soft tissue stranding in the anterior
abdominal wall consistent with recent surgery. No fluid collection
in this area.

Musculoskeletal: There is degenerative change in the lower thoracic
and lumbar spine regions. There are no blastic or lytic bone
lesions. No intramuscular or abdominal wall lesions evident.
IMPRESSION: 1. Extensive acute pancreatitis with extensive pancreatic edema
throughout the head and uncinate process regions. There are
intermittent areas of pancreatic necrosis in these areas. More
severe pancreatic necrosis is seen throughout the body of the
pancreas and in the region of the head of the pancreas near the
junction with the body. There are foci of air in these areas. There
is also lack of enhancement throughout most of the body of the
pancreas. There is intermittent enhancement in the inflamed head and
uncinate process regions. Much of the pancreatic tail appears
essentially normal. There is soft tissue/mesenteric stranding
surrounding the of pancreas in the head and uncinate process
regions.

2. Extensive inflammation involving the distal stomach and most of
the duodenum due to proximity to the pancreatic inflammation. There
is also wall thickening in a portion of the ascending colon near the
inflamed duodenum and head of pancreas.

3. Gallbladder absent. There is intrahepatic and extrahepatic
biliary duct dilatation. No mass or calculus evident the biliary
ductal system. The biliary ductal system is probably exacerbated by
the pancreatitis.

4.  Splenomegaly without focal splenic lesion.

5.  No bowel obstruction.

6.  No renal or ureteral calculus.  No hydronephrosis.

7.  Aortoiliac atherosclerosis.

8. Postoperative change with stranding in the midportion anterior
abdominal wall.

Aortic Atherosclerosis ([id number]).

## 2019-02-13 DIAGNOSIS — D638 Anemia in other chronic diseases classified elsewhere: Secondary | ICD-10-CM | POA: Diagnosis not present

## 2019-02-13 DIAGNOSIS — R0602 Shortness of breath: Secondary | ICD-10-CM | POA: Diagnosis not present

## 2019-02-13 DIAGNOSIS — Z89421 Acquired absence of other right toe(s): Secondary | ICD-10-CM | POA: Diagnosis not present

## 2019-02-13 DIAGNOSIS — I2510 Atherosclerotic heart disease of native coronary artery without angina pectoris: Secondary | ICD-10-CM | POA: Diagnosis not present

## 2019-02-13 DIAGNOSIS — T59811A Toxic effect of smoke, accidental (unintentional), initial encounter: Secondary | ICD-10-CM | POA: Diagnosis not present

## 2019-02-13 DIAGNOSIS — I10 Essential (primary) hypertension: Secondary | ICD-10-CM | POA: Diagnosis not present

## 2019-02-13 DIAGNOSIS — I480 Paroxysmal atrial fibrillation: Secondary | ICD-10-CM | POA: Diagnosis not present

## 2019-02-13 DIAGNOSIS — Z8249 Family history of ischemic heart disease and other diseases of the circulatory system: Secondary | ICD-10-CM | POA: Diagnosis not present

## 2019-02-13 DIAGNOSIS — Z89412 Acquired absence of left great toe: Secondary | ICD-10-CM | POA: Diagnosis not present

## 2019-02-13 DIAGNOSIS — J9811 Atelectasis: Secondary | ICD-10-CM | POA: Diagnosis not present

## 2019-02-13 DIAGNOSIS — Z79899 Other long term (current) drug therapy: Secondary | ICD-10-CM | POA: Diagnosis not present

## 2019-02-13 DIAGNOSIS — M1990 Unspecified osteoarthritis, unspecified site: Secondary | ICD-10-CM | POA: Diagnosis not present

## 2019-02-13 DIAGNOSIS — M545 Low back pain: Secondary | ICD-10-CM | POA: Diagnosis not present

## 2019-02-13 DIAGNOSIS — E1142 Type 2 diabetes mellitus with diabetic polyneuropathy: Secondary | ICD-10-CM | POA: Diagnosis not present

## 2019-02-13 DIAGNOSIS — Z7901 Long term (current) use of anticoagulants: Secondary | ICD-10-CM | POA: Diagnosis not present

## 2019-02-13 DIAGNOSIS — J705 Respiratory conditions due to smoke inhalation: Secondary | ICD-10-CM | POA: Diagnosis not present

## 2019-02-13 DIAGNOSIS — R600 Localized edema: Secondary | ICD-10-CM | POA: Diagnosis not present

## 2019-02-13 DIAGNOSIS — R55 Syncope and collapse: Secondary | ICD-10-CM | POA: Diagnosis not present

## 2019-02-13 DIAGNOSIS — Z794 Long term (current) use of insulin: Secondary | ICD-10-CM | POA: Diagnosis not present

## 2019-02-13 DIAGNOSIS — E1151 Type 2 diabetes mellitus with diabetic peripheral angiopathy without gangrene: Secondary | ICD-10-CM | POA: Diagnosis not present

## 2019-02-13 DIAGNOSIS — T588X1A Toxic effect of carbon monoxide from other source, accidental (unintentional), initial encounter: Secondary | ICD-10-CM | POA: Diagnosis not present

## 2019-02-13 DIAGNOSIS — Z89422 Acquired absence of other left toe(s): Secondary | ICD-10-CM | POA: Diagnosis not present

## 2019-02-13 DIAGNOSIS — E785 Hyperlipidemia, unspecified: Secondary | ICD-10-CM | POA: Diagnosis not present

## 2019-02-13 DIAGNOSIS — G8929 Other chronic pain: Secondary | ICD-10-CM | POA: Diagnosis not present

## 2019-02-14 DIAGNOSIS — E1142 Type 2 diabetes mellitus with diabetic polyneuropathy: Secondary | ICD-10-CM | POA: Diagnosis not present

## 2019-02-14 DIAGNOSIS — I10 Essential (primary) hypertension: Secondary | ICD-10-CM | POA: Diagnosis not present

## 2019-02-14 DIAGNOSIS — T59811A Toxic effect of smoke, accidental (unintentional), initial encounter: Secondary | ICD-10-CM | POA: Diagnosis not present

## 2019-02-15 DIAGNOSIS — S4991XA Unspecified injury of right shoulder and upper arm, initial encounter: Secondary | ICD-10-CM | POA: Diagnosis not present

## 2019-02-15 DIAGNOSIS — S40011A Contusion of right shoulder, initial encounter: Secondary | ICD-10-CM | POA: Diagnosis not present

## 2019-02-15 DIAGNOSIS — Z79899 Other long term (current) drug therapy: Secondary | ICD-10-CM | POA: Diagnosis not present

## 2019-02-15 DIAGNOSIS — M25511 Pain in right shoulder: Secondary | ICD-10-CM | POA: Diagnosis not present

## 2019-02-15 DIAGNOSIS — Z7901 Long term (current) use of anticoagulants: Secondary | ICD-10-CM | POA: Diagnosis not present

## 2019-02-15 DIAGNOSIS — M5136 Other intervertebral disc degeneration, lumbar region: Secondary | ICD-10-CM | POA: Diagnosis not present

## 2019-02-15 DIAGNOSIS — E119 Type 2 diabetes mellitus without complications: Secondary | ICD-10-CM | POA: Diagnosis not present

## 2019-02-15 DIAGNOSIS — I2510 Atherosclerotic heart disease of native coronary artery without angina pectoris: Secondary | ICD-10-CM | POA: Diagnosis not present

## 2019-02-15 DIAGNOSIS — Z794 Long term (current) use of insulin: Secondary | ICD-10-CM | POA: Diagnosis not present

## 2019-02-15 DIAGNOSIS — G8911 Acute pain due to trauma: Secondary | ICD-10-CM | POA: Diagnosis not present

## 2019-02-15 DIAGNOSIS — Z87891 Personal history of nicotine dependence: Secondary | ICD-10-CM | POA: Diagnosis not present

## 2019-03-08 DIAGNOSIS — M25561 Pain in right knee: Secondary | ICD-10-CM | POA: Diagnosis not present

## 2019-03-08 DIAGNOSIS — M1611 Unilateral primary osteoarthritis, right hip: Secondary | ICD-10-CM | POA: Diagnosis not present

## 2019-03-08 DIAGNOSIS — M1711 Unilateral primary osteoarthritis, right knee: Secondary | ICD-10-CM | POA: Diagnosis not present

## 2019-03-08 DIAGNOSIS — G8929 Other chronic pain: Secondary | ICD-10-CM | POA: Diagnosis not present

## 2019-03-08 DIAGNOSIS — M5136 Other intervertebral disc degeneration, lumbar region: Secondary | ICD-10-CM | POA: Diagnosis not present

## 2019-03-14 DIAGNOSIS — Z89421 Acquired absence of other right toe(s): Secondary | ICD-10-CM | POA: Diagnosis not present

## 2019-03-14 DIAGNOSIS — E11621 Type 2 diabetes mellitus with foot ulcer: Secondary | ICD-10-CM | POA: Diagnosis not present

## 2019-03-14 DIAGNOSIS — L97415 Non-pressure chronic ulcer of right heel and midfoot with muscle involvement without evidence of necrosis: Secondary | ICD-10-CM | POA: Diagnosis not present

## 2019-03-14 DIAGNOSIS — Z89422 Acquired absence of other left toe(s): Secondary | ICD-10-CM | POA: Diagnosis not present

## 2019-03-14 DIAGNOSIS — E1142 Type 2 diabetes mellitus with diabetic polyneuropathy: Secondary | ICD-10-CM | POA: Diagnosis not present

## 2019-03-23 DIAGNOSIS — Z0000 Encounter for general adult medical examination without abnormal findings: Secondary | ICD-10-CM | POA: Diagnosis not present

## 2019-03-23 DIAGNOSIS — I10 Essential (primary) hypertension: Secondary | ICD-10-CM | POA: Diagnosis not present

## 2019-03-23 DIAGNOSIS — Z23 Encounter for immunization: Secondary | ICD-10-CM | POA: Diagnosis not present

## 2019-03-23 DIAGNOSIS — E1165 Type 2 diabetes mellitus with hyperglycemia: Secondary | ICD-10-CM | POA: Diagnosis not present

## 2019-03-23 DIAGNOSIS — E785 Hyperlipidemia, unspecified: Secondary | ICD-10-CM | POA: Diagnosis not present

## 2019-03-23 DIAGNOSIS — E1140 Type 2 diabetes mellitus with diabetic neuropathy, unspecified: Secondary | ICD-10-CM | POA: Diagnosis not present

## 2019-04-08 DIAGNOSIS — M25511 Pain in right shoulder: Secondary | ICD-10-CM | POA: Diagnosis not present

## 2019-04-08 DIAGNOSIS — M19011 Primary osteoarthritis, right shoulder: Secondary | ICD-10-CM | POA: Diagnosis not present

## 2019-04-08 DIAGNOSIS — M7541 Impingement syndrome of right shoulder: Secondary | ICD-10-CM | POA: Diagnosis not present

## 2019-04-22 DIAGNOSIS — E785 Hyperlipidemia, unspecified: Secondary | ICD-10-CM | POA: Diagnosis not present

## 2019-04-22 DIAGNOSIS — I10 Essential (primary) hypertension: Secondary | ICD-10-CM | POA: Diagnosis not present

## 2019-04-25 DIAGNOSIS — E11621 Type 2 diabetes mellitus with foot ulcer: Secondary | ICD-10-CM | POA: Diagnosis not present

## 2019-04-25 DIAGNOSIS — L97529 Non-pressure chronic ulcer of other part of left foot with unspecified severity: Secondary | ICD-10-CM | POA: Diagnosis not present

## 2019-04-25 DIAGNOSIS — L97519 Non-pressure chronic ulcer of other part of right foot with unspecified severity: Secondary | ICD-10-CM | POA: Diagnosis not present

## 2019-04-25 DIAGNOSIS — Z89422 Acquired absence of other left toe(s): Secondary | ICD-10-CM | POA: Diagnosis not present

## 2019-04-25 DIAGNOSIS — E1142 Type 2 diabetes mellitus with diabetic polyneuropathy: Secondary | ICD-10-CM | POA: Diagnosis not present

## 2019-05-18 DIAGNOSIS — L97519 Non-pressure chronic ulcer of other part of right foot with unspecified severity: Secondary | ICD-10-CM | POA: Diagnosis not present

## 2019-05-18 DIAGNOSIS — Z89422 Acquired absence of other left toe(s): Secondary | ICD-10-CM | POA: Diagnosis not present

## 2019-05-18 DIAGNOSIS — L97529 Non-pressure chronic ulcer of other part of left foot with unspecified severity: Secondary | ICD-10-CM | POA: Diagnosis not present

## 2019-05-18 DIAGNOSIS — L03115 Cellulitis of right lower limb: Secondary | ICD-10-CM | POA: Diagnosis not present

## 2019-05-18 DIAGNOSIS — E11621 Type 2 diabetes mellitus with foot ulcer: Secondary | ICD-10-CM | POA: Diagnosis not present

## 2019-05-23 DIAGNOSIS — E785 Hyperlipidemia, unspecified: Secondary | ICD-10-CM | POA: Diagnosis not present

## 2019-05-23 DIAGNOSIS — L97529 Non-pressure chronic ulcer of other part of left foot with unspecified severity: Secondary | ICD-10-CM | POA: Diagnosis not present

## 2019-05-23 DIAGNOSIS — E1140 Type 2 diabetes mellitus with diabetic neuropathy, unspecified: Secondary | ICD-10-CM | POA: Diagnosis not present

## 2019-05-23 DIAGNOSIS — I10 Essential (primary) hypertension: Secondary | ICD-10-CM | POA: Diagnosis not present

## 2019-06-13 DIAGNOSIS — L97519 Non-pressure chronic ulcer of other part of right foot with unspecified severity: Secondary | ICD-10-CM | POA: Diagnosis not present

## 2019-06-13 DIAGNOSIS — E11621 Type 2 diabetes mellitus with foot ulcer: Secondary | ICD-10-CM | POA: Diagnosis not present

## 2019-06-13 DIAGNOSIS — L97529 Non-pressure chronic ulcer of other part of left foot with unspecified severity: Secondary | ICD-10-CM | POA: Diagnosis not present

## 2019-06-13 DIAGNOSIS — E1142 Type 2 diabetes mellitus with diabetic polyneuropathy: Secondary | ICD-10-CM | POA: Diagnosis not present

## 2019-06-13 DIAGNOSIS — Z794 Long term (current) use of insulin: Secondary | ICD-10-CM | POA: Diagnosis not present

## 2019-06-23 DIAGNOSIS — E1140 Type 2 diabetes mellitus with diabetic neuropathy, unspecified: Secondary | ICD-10-CM | POA: Diagnosis not present

## 2019-06-23 DIAGNOSIS — E785 Hyperlipidemia, unspecified: Secondary | ICD-10-CM | POA: Diagnosis not present

## 2019-07-05 DIAGNOSIS — I2699 Other pulmonary embolism without acute cor pulmonale: Secondary | ICD-10-CM | POA: Diagnosis not present

## 2019-07-05 DIAGNOSIS — E118 Type 2 diabetes mellitus with unspecified complications: Secondary | ICD-10-CM | POA: Diagnosis not present

## 2019-07-05 DIAGNOSIS — I10 Essential (primary) hypertension: Secondary | ICD-10-CM | POA: Diagnosis not present

## 2019-07-05 DIAGNOSIS — E785 Hyperlipidemia, unspecified: Secondary | ICD-10-CM | POA: Diagnosis not present

## 2019-07-05 DIAGNOSIS — R5382 Chronic fatigue, unspecified: Secondary | ICD-10-CM | POA: Diagnosis not present

## 2019-07-14 DIAGNOSIS — Z713 Dietary counseling and surveillance: Secondary | ICD-10-CM | POA: Diagnosis not present

## 2019-07-14 DIAGNOSIS — E785 Hyperlipidemia, unspecified: Secondary | ICD-10-CM | POA: Diagnosis not present

## 2019-07-14 DIAGNOSIS — E118 Type 2 diabetes mellitus with unspecified complications: Secondary | ICD-10-CM | POA: Diagnosis not present

## 2019-07-14 DIAGNOSIS — I10 Essential (primary) hypertension: Secondary | ICD-10-CM | POA: Diagnosis not present

## 2019-07-15 DIAGNOSIS — E118 Type 2 diabetes mellitus with unspecified complications: Secondary | ICD-10-CM | POA: Diagnosis not present

## 2019-07-15 DIAGNOSIS — M549 Dorsalgia, unspecified: Secondary | ICD-10-CM | POA: Diagnosis not present

## 2019-07-15 DIAGNOSIS — M25512 Pain in left shoulder: Secondary | ICD-10-CM | POA: Diagnosis not present

## 2019-07-15 DIAGNOSIS — I2699 Other pulmonary embolism without acute cor pulmonale: Secondary | ICD-10-CM | POA: Diagnosis not present

## 2019-07-20 DIAGNOSIS — Z86711 Personal history of pulmonary embolism: Secondary | ICD-10-CM | POA: Diagnosis not present

## 2019-07-20 DIAGNOSIS — M7731 Calcaneal spur, right foot: Secondary | ICD-10-CM | POA: Diagnosis not present

## 2019-07-20 DIAGNOSIS — L089 Local infection of the skin and subcutaneous tissue, unspecified: Secondary | ICD-10-CM | POA: Diagnosis not present

## 2019-07-20 DIAGNOSIS — Z452 Encounter for adjustment and management of vascular access device: Secondary | ICD-10-CM | POA: Diagnosis not present

## 2019-07-20 DIAGNOSIS — Z20822 Contact with and (suspected) exposure to covid-19: Secondary | ICD-10-CM | POA: Diagnosis not present

## 2019-07-20 DIAGNOSIS — S90852A Superficial foreign body, left foot, initial encounter: Secondary | ICD-10-CM | POA: Diagnosis not present

## 2019-07-20 DIAGNOSIS — Z7982 Long term (current) use of aspirin: Secondary | ICD-10-CM | POA: Diagnosis not present

## 2019-07-20 DIAGNOSIS — I480 Paroxysmal atrial fibrillation: Secondary | ICD-10-CM | POA: Diagnosis not present

## 2019-07-20 DIAGNOSIS — D631 Anemia in chronic kidney disease: Secondary | ICD-10-CM | POA: Diagnosis not present

## 2019-07-20 DIAGNOSIS — E1165 Type 2 diabetes mellitus with hyperglycemia: Secondary | ICD-10-CM | POA: Diagnosis not present

## 2019-07-20 DIAGNOSIS — Z794 Long term (current) use of insulin: Secondary | ICD-10-CM | POA: Diagnosis not present

## 2019-07-20 DIAGNOSIS — E1142 Type 2 diabetes mellitus with diabetic polyneuropathy: Secondary | ICD-10-CM | POA: Diagnosis not present

## 2019-07-20 DIAGNOSIS — M6281 Muscle weakness (generalized): Secondary | ICD-10-CM | POA: Diagnosis not present

## 2019-07-20 DIAGNOSIS — S91301A Unspecified open wound, right foot, initial encounter: Secondary | ICD-10-CM | POA: Diagnosis not present

## 2019-07-20 DIAGNOSIS — N183 Chronic kidney disease, stage 3 unspecified: Secondary | ICD-10-CM | POA: Diagnosis not present

## 2019-07-20 DIAGNOSIS — A280 Pasteurellosis: Secondary | ICD-10-CM | POA: Diagnosis not present

## 2019-07-20 DIAGNOSIS — Z825 Family history of asthma and other chronic lower respiratory diseases: Secondary | ICD-10-CM | POA: Diagnosis not present

## 2019-07-20 DIAGNOSIS — L03115 Cellulitis of right lower limb: Secondary | ICD-10-CM | POA: Diagnosis not present

## 2019-07-20 DIAGNOSIS — Z79899 Other long term (current) drug therapy: Secondary | ICD-10-CM | POA: Diagnosis not present

## 2019-07-20 DIAGNOSIS — E11621 Type 2 diabetes mellitus with foot ulcer: Secondary | ICD-10-CM | POA: Diagnosis not present

## 2019-07-20 DIAGNOSIS — L97519 Non-pressure chronic ulcer of other part of right foot with unspecified severity: Secondary | ICD-10-CM | POA: Diagnosis not present

## 2019-07-20 DIAGNOSIS — L97419 Non-pressure chronic ulcer of right heel and midfoot with unspecified severity: Secondary | ICD-10-CM | POA: Diagnosis not present

## 2019-07-20 DIAGNOSIS — I96 Gangrene, not elsewhere classified: Secondary | ICD-10-CM | POA: Diagnosis not present

## 2019-07-20 DIAGNOSIS — E785 Hyperlipidemia, unspecified: Secondary | ICD-10-CM | POA: Diagnosis not present

## 2019-07-20 DIAGNOSIS — Z833 Family history of diabetes mellitus: Secondary | ICD-10-CM | POA: Diagnosis not present

## 2019-07-20 DIAGNOSIS — I129 Hypertensive chronic kidney disease with stage 1 through stage 4 chronic kidney disease, or unspecified chronic kidney disease: Secondary | ICD-10-CM | POA: Diagnosis not present

## 2019-07-20 DIAGNOSIS — Z8249 Family history of ischemic heart disease and other diseases of the circulatory system: Secondary | ICD-10-CM | POA: Diagnosis not present

## 2019-07-20 DIAGNOSIS — L97514 Non-pressure chronic ulcer of other part of right foot with necrosis of bone: Secondary | ICD-10-CM | POA: Diagnosis not present

## 2019-07-20 DIAGNOSIS — L97509 Non-pressure chronic ulcer of other part of unspecified foot with unspecified severity: Secondary | ICD-10-CM | POA: Diagnosis not present

## 2019-07-20 DIAGNOSIS — E11628 Type 2 diabetes mellitus with other skin complications: Secondary | ICD-10-CM | POA: Diagnosis not present

## 2019-07-20 DIAGNOSIS — L97512 Non-pressure chronic ulcer of other part of right foot with fat layer exposed: Secondary | ICD-10-CM | POA: Diagnosis not present

## 2019-07-20 DIAGNOSIS — E1122 Type 2 diabetes mellitus with diabetic chronic kidney disease: Secondary | ICD-10-CM | POA: Diagnosis not present

## 2019-07-20 DIAGNOSIS — E1140 Type 2 diabetes mellitus with diabetic neuropathy, unspecified: Secondary | ICD-10-CM | POA: Diagnosis not present

## 2019-07-20 DIAGNOSIS — M86671 Other chronic osteomyelitis, right ankle and foot: Secondary | ICD-10-CM | POA: Diagnosis not present

## 2019-07-20 DIAGNOSIS — Z89431 Acquired absence of right foot: Secondary | ICD-10-CM | POA: Diagnosis not present

## 2019-07-20 DIAGNOSIS — S91302A Unspecified open wound, left foot, initial encounter: Secondary | ICD-10-CM | POA: Diagnosis not present

## 2019-07-20 DIAGNOSIS — I10 Essential (primary) hypertension: Secondary | ICD-10-CM | POA: Diagnosis not present

## 2019-07-20 DIAGNOSIS — I2510 Atherosclerotic heart disease of native coronary artery without angina pectoris: Secondary | ICD-10-CM | POA: Diagnosis not present

## 2019-07-20 DIAGNOSIS — E13621 Other specified diabetes mellitus with foot ulcer: Secondary | ICD-10-CM | POA: Diagnosis not present

## 2019-07-20 DIAGNOSIS — Z7901 Long term (current) use of anticoagulants: Secondary | ICD-10-CM | POA: Diagnosis not present

## 2019-07-20 DIAGNOSIS — A499 Bacterial infection, unspecified: Secondary | ICD-10-CM | POA: Diagnosis not present

## 2019-07-20 DIAGNOSIS — E119 Type 2 diabetes mellitus without complications: Secondary | ICD-10-CM | POA: Diagnosis not present

## 2019-07-21 DIAGNOSIS — L97514 Non-pressure chronic ulcer of other part of right foot with necrosis of bone: Secondary | ICD-10-CM | POA: Diagnosis not present

## 2019-07-21 DIAGNOSIS — E13621 Other specified diabetes mellitus with foot ulcer: Secondary | ICD-10-CM | POA: Diagnosis not present

## 2019-07-23 DIAGNOSIS — E785 Hyperlipidemia, unspecified: Secondary | ICD-10-CM | POA: Diagnosis not present

## 2019-07-23 DIAGNOSIS — I10 Essential (primary) hypertension: Secondary | ICD-10-CM | POA: Diagnosis not present

## 2019-07-23 DIAGNOSIS — E1140 Type 2 diabetes mellitus with diabetic neuropathy, unspecified: Secondary | ICD-10-CM | POA: Diagnosis not present

## 2019-07-27 DIAGNOSIS — E1122 Type 2 diabetes mellitus with diabetic chronic kidney disease: Secondary | ICD-10-CM | POA: Diagnosis not present

## 2019-07-27 DIAGNOSIS — B954 Other streptococcus as the cause of diseases classified elsewhere: Secondary | ICD-10-CM | POA: Diagnosis not present

## 2019-07-27 DIAGNOSIS — E1151 Type 2 diabetes mellitus with diabetic peripheral angiopathy without gangrene: Secondary | ICD-10-CM | POA: Diagnosis not present

## 2019-07-27 DIAGNOSIS — N183 Chronic kidney disease, stage 3 unspecified: Secondary | ICD-10-CM | POA: Diagnosis not present

## 2019-07-27 DIAGNOSIS — E1142 Type 2 diabetes mellitus with diabetic polyneuropathy: Secondary | ICD-10-CM | POA: Diagnosis not present

## 2019-07-27 DIAGNOSIS — Z792 Long term (current) use of antibiotics: Secondary | ICD-10-CM | POA: Diagnosis not present

## 2019-07-27 DIAGNOSIS — B9561 Methicillin susceptible Staphylococcus aureus infection as the cause of diseases classified elsewhere: Secondary | ICD-10-CM | POA: Diagnosis not present

## 2019-07-27 DIAGNOSIS — Z452 Encounter for adjustment and management of vascular access device: Secondary | ICD-10-CM | POA: Diagnosis not present

## 2019-07-27 DIAGNOSIS — M5116 Intervertebral disc disorders with radiculopathy, lumbar region: Secondary | ICD-10-CM | POA: Diagnosis not present

## 2019-07-27 DIAGNOSIS — E1169 Type 2 diabetes mellitus with other specified complication: Secondary | ICD-10-CM | POA: Diagnosis not present

## 2019-07-27 DIAGNOSIS — Z5181 Encounter for therapeutic drug level monitoring: Secondary | ICD-10-CM | POA: Diagnosis not present

## 2019-07-27 DIAGNOSIS — B9689 Other specified bacterial agents as the cause of diseases classified elsewhere: Secondary | ICD-10-CM | POA: Diagnosis not present

## 2019-07-27 DIAGNOSIS — I129 Hypertensive chronic kidney disease with stage 1 through stage 4 chronic kidney disease, or unspecified chronic kidney disease: Secondary | ICD-10-CM | POA: Diagnosis not present

## 2019-07-27 DIAGNOSIS — I2510 Atherosclerotic heart disease of native coronary artery without angina pectoris: Secondary | ICD-10-CM | POA: Diagnosis not present

## 2019-07-27 DIAGNOSIS — E11628 Type 2 diabetes mellitus with other skin complications: Secondary | ICD-10-CM | POA: Diagnosis not present

## 2019-07-27 DIAGNOSIS — T8789 Other complications of amputation stump: Secondary | ICD-10-CM | POA: Diagnosis not present

## 2019-07-27 DIAGNOSIS — G8929 Other chronic pain: Secondary | ICD-10-CM | POA: Diagnosis not present

## 2019-07-27 DIAGNOSIS — L089 Local infection of the skin and subcutaneous tissue, unspecified: Secondary | ICD-10-CM | POA: Diagnosis not present

## 2019-07-27 DIAGNOSIS — L03115 Cellulitis of right lower limb: Secondary | ICD-10-CM | POA: Diagnosis not present

## 2019-07-27 DIAGNOSIS — E1165 Type 2 diabetes mellitus with hyperglycemia: Secondary | ICD-10-CM | POA: Diagnosis not present

## 2019-07-27 DIAGNOSIS — M869 Osteomyelitis, unspecified: Secondary | ICD-10-CM | POA: Diagnosis not present

## 2019-07-27 DIAGNOSIS — I480 Paroxysmal atrial fibrillation: Secondary | ICD-10-CM | POA: Diagnosis not present

## 2019-07-27 DIAGNOSIS — Z794 Long term (current) use of insulin: Secondary | ICD-10-CM | POA: Diagnosis not present

## 2019-07-27 DIAGNOSIS — D631 Anemia in chronic kidney disease: Secondary | ICD-10-CM | POA: Diagnosis not present

## 2019-07-27 DIAGNOSIS — M868X7 Other osteomyelitis, ankle and foot: Secondary | ICD-10-CM | POA: Diagnosis not present

## 2019-07-28 DIAGNOSIS — N183 Chronic kidney disease, stage 3 unspecified: Secondary | ICD-10-CM | POA: Diagnosis not present

## 2019-07-28 DIAGNOSIS — Z794 Long term (current) use of insulin: Secondary | ICD-10-CM | POA: Diagnosis not present

## 2019-07-28 DIAGNOSIS — D631 Anemia in chronic kidney disease: Secondary | ICD-10-CM | POA: Diagnosis not present

## 2019-07-28 DIAGNOSIS — E1122 Type 2 diabetes mellitus with diabetic chronic kidney disease: Secondary | ICD-10-CM | POA: Diagnosis not present

## 2019-07-28 DIAGNOSIS — Z452 Encounter for adjustment and management of vascular access device: Secondary | ICD-10-CM | POA: Diagnosis not present

## 2019-07-28 DIAGNOSIS — G8929 Other chronic pain: Secondary | ICD-10-CM | POA: Diagnosis not present

## 2019-07-28 DIAGNOSIS — Z792 Long term (current) use of antibiotics: Secondary | ICD-10-CM | POA: Diagnosis not present

## 2019-07-28 DIAGNOSIS — B9689 Other specified bacterial agents as the cause of diseases classified elsewhere: Secondary | ICD-10-CM | POA: Diagnosis not present

## 2019-07-28 DIAGNOSIS — Z89422 Acquired absence of other left toe(s): Secondary | ICD-10-CM | POA: Diagnosis not present

## 2019-07-28 DIAGNOSIS — M868X7 Other osteomyelitis, ankle and foot: Secondary | ICD-10-CM | POA: Diagnosis not present

## 2019-07-28 DIAGNOSIS — E1169 Type 2 diabetes mellitus with other specified complication: Secondary | ICD-10-CM | POA: Diagnosis not present

## 2019-07-28 DIAGNOSIS — M5116 Intervertebral disc disorders with radiculopathy, lumbar region: Secondary | ICD-10-CM | POA: Diagnosis not present

## 2019-07-28 DIAGNOSIS — E1165 Type 2 diabetes mellitus with hyperglycemia: Secondary | ICD-10-CM | POA: Diagnosis not present

## 2019-07-28 DIAGNOSIS — B9561 Methicillin susceptible Staphylococcus aureus infection as the cause of diseases classified elsewhere: Secondary | ICD-10-CM | POA: Diagnosis not present

## 2019-07-28 DIAGNOSIS — L03115 Cellulitis of right lower limb: Secondary | ICD-10-CM | POA: Diagnosis not present

## 2019-07-28 DIAGNOSIS — E1142 Type 2 diabetes mellitus with diabetic polyneuropathy: Secondary | ICD-10-CM | POA: Diagnosis not present

## 2019-07-28 DIAGNOSIS — I480 Paroxysmal atrial fibrillation: Secondary | ICD-10-CM | POA: Diagnosis not present

## 2019-07-28 DIAGNOSIS — I129 Hypertensive chronic kidney disease with stage 1 through stage 4 chronic kidney disease, or unspecified chronic kidney disease: Secondary | ICD-10-CM | POA: Diagnosis not present

## 2019-07-28 DIAGNOSIS — B954 Other streptococcus as the cause of diseases classified elsewhere: Secondary | ICD-10-CM | POA: Diagnosis not present

## 2019-07-28 DIAGNOSIS — E1151 Type 2 diabetes mellitus with diabetic peripheral angiopathy without gangrene: Secondary | ICD-10-CM | POA: Diagnosis not present

## 2019-07-28 DIAGNOSIS — T8789 Other complications of amputation stump: Secondary | ICD-10-CM | POA: Diagnosis not present

## 2019-07-28 DIAGNOSIS — I2510 Atherosclerotic heart disease of native coronary artery without angina pectoris: Secondary | ICD-10-CM | POA: Diagnosis not present

## 2019-07-28 DIAGNOSIS — Z5181 Encounter for therapeutic drug level monitoring: Secondary | ICD-10-CM | POA: Diagnosis not present

## 2019-07-28 DIAGNOSIS — R2689 Other abnormalities of gait and mobility: Secondary | ICD-10-CM | POA: Diagnosis not present

## 2019-07-29 DIAGNOSIS — T8189XA Other complications of procedures, not elsewhere classified, initial encounter: Secondary | ICD-10-CM | POA: Diagnosis not present

## 2019-07-30 DIAGNOSIS — B954 Other streptococcus as the cause of diseases classified elsewhere: Secondary | ICD-10-CM | POA: Diagnosis not present

## 2019-07-30 DIAGNOSIS — T8789 Other complications of amputation stump: Secondary | ICD-10-CM | POA: Diagnosis not present

## 2019-07-30 DIAGNOSIS — E1142 Type 2 diabetes mellitus with diabetic polyneuropathy: Secondary | ICD-10-CM | POA: Diagnosis not present

## 2019-07-30 DIAGNOSIS — E1122 Type 2 diabetes mellitus with diabetic chronic kidney disease: Secondary | ICD-10-CM | POA: Diagnosis not present

## 2019-07-30 DIAGNOSIS — I129 Hypertensive chronic kidney disease with stage 1 through stage 4 chronic kidney disease, or unspecified chronic kidney disease: Secondary | ICD-10-CM | POA: Diagnosis not present

## 2019-07-30 DIAGNOSIS — Z5181 Encounter for therapeutic drug level monitoring: Secondary | ICD-10-CM | POA: Diagnosis not present

## 2019-07-30 DIAGNOSIS — E1165 Type 2 diabetes mellitus with hyperglycemia: Secondary | ICD-10-CM | POA: Diagnosis not present

## 2019-07-30 DIAGNOSIS — M868X7 Other osteomyelitis, ankle and foot: Secondary | ICD-10-CM | POA: Diagnosis not present

## 2019-07-30 DIAGNOSIS — N183 Chronic kidney disease, stage 3 unspecified: Secondary | ICD-10-CM | POA: Diagnosis not present

## 2019-07-30 DIAGNOSIS — E1151 Type 2 diabetes mellitus with diabetic peripheral angiopathy without gangrene: Secondary | ICD-10-CM | POA: Diagnosis not present

## 2019-07-30 DIAGNOSIS — B9561 Methicillin susceptible Staphylococcus aureus infection as the cause of diseases classified elsewhere: Secondary | ICD-10-CM | POA: Diagnosis not present

## 2019-07-30 DIAGNOSIS — E1169 Type 2 diabetes mellitus with other specified complication: Secondary | ICD-10-CM | POA: Diagnosis not present

## 2019-07-30 DIAGNOSIS — Z792 Long term (current) use of antibiotics: Secondary | ICD-10-CM | POA: Diagnosis not present

## 2019-07-30 DIAGNOSIS — B9689 Other specified bacterial agents as the cause of diseases classified elsewhere: Secondary | ICD-10-CM | POA: Diagnosis not present

## 2019-07-30 DIAGNOSIS — M5116 Intervertebral disc disorders with radiculopathy, lumbar region: Secondary | ICD-10-CM | POA: Diagnosis not present

## 2019-07-30 DIAGNOSIS — G8929 Other chronic pain: Secondary | ICD-10-CM | POA: Diagnosis not present

## 2019-07-30 DIAGNOSIS — D631 Anemia in chronic kidney disease: Secondary | ICD-10-CM | POA: Diagnosis not present

## 2019-07-30 DIAGNOSIS — I2510 Atherosclerotic heart disease of native coronary artery without angina pectoris: Secondary | ICD-10-CM | POA: Diagnosis not present

## 2019-07-30 DIAGNOSIS — I480 Paroxysmal atrial fibrillation: Secondary | ICD-10-CM | POA: Diagnosis not present

## 2019-07-30 DIAGNOSIS — L03115 Cellulitis of right lower limb: Secondary | ICD-10-CM | POA: Diagnosis not present

## 2019-07-30 DIAGNOSIS — Z794 Long term (current) use of insulin: Secondary | ICD-10-CM | POA: Diagnosis not present

## 2019-07-30 DIAGNOSIS — Z452 Encounter for adjustment and management of vascular access device: Secondary | ICD-10-CM | POA: Diagnosis not present

## 2019-08-01 DIAGNOSIS — Z794 Long term (current) use of insulin: Secondary | ICD-10-CM | POA: Diagnosis not present

## 2019-08-01 DIAGNOSIS — Z792 Long term (current) use of antibiotics: Secondary | ICD-10-CM | POA: Diagnosis not present

## 2019-08-01 DIAGNOSIS — Z5181 Encounter for therapeutic drug level monitoring: Secondary | ICD-10-CM | POA: Diagnosis not present

## 2019-08-01 DIAGNOSIS — I129 Hypertensive chronic kidney disease with stage 1 through stage 4 chronic kidney disease, or unspecified chronic kidney disease: Secondary | ICD-10-CM | POA: Diagnosis not present

## 2019-08-01 DIAGNOSIS — D631 Anemia in chronic kidney disease: Secondary | ICD-10-CM | POA: Diagnosis not present

## 2019-08-01 DIAGNOSIS — M868X7 Other osteomyelitis, ankle and foot: Secondary | ICD-10-CM | POA: Diagnosis not present

## 2019-08-01 DIAGNOSIS — E1122 Type 2 diabetes mellitus with diabetic chronic kidney disease: Secondary | ICD-10-CM | POA: Diagnosis not present

## 2019-08-01 DIAGNOSIS — E1142 Type 2 diabetes mellitus with diabetic polyneuropathy: Secondary | ICD-10-CM | POA: Diagnosis not present

## 2019-08-01 DIAGNOSIS — M5116 Intervertebral disc disorders with radiculopathy, lumbar region: Secondary | ICD-10-CM | POA: Diagnosis not present

## 2019-08-01 DIAGNOSIS — T8789 Other complications of amputation stump: Secondary | ICD-10-CM | POA: Diagnosis not present

## 2019-08-01 DIAGNOSIS — I2510 Atherosclerotic heart disease of native coronary artery without angina pectoris: Secondary | ICD-10-CM | POA: Diagnosis not present

## 2019-08-01 DIAGNOSIS — E11628 Type 2 diabetes mellitus with other skin complications: Secondary | ICD-10-CM | POA: Diagnosis not present

## 2019-08-01 DIAGNOSIS — G8929 Other chronic pain: Secondary | ICD-10-CM | POA: Diagnosis not present

## 2019-08-01 DIAGNOSIS — L03115 Cellulitis of right lower limb: Secondary | ICD-10-CM | POA: Diagnosis not present

## 2019-08-01 DIAGNOSIS — E1169 Type 2 diabetes mellitus with other specified complication: Secondary | ICD-10-CM | POA: Diagnosis not present

## 2019-08-01 DIAGNOSIS — N183 Chronic kidney disease, stage 3 unspecified: Secondary | ICD-10-CM | POA: Diagnosis not present

## 2019-08-01 DIAGNOSIS — Z452 Encounter for adjustment and management of vascular access device: Secondary | ICD-10-CM | POA: Diagnosis not present

## 2019-08-01 DIAGNOSIS — B9689 Other specified bacterial agents as the cause of diseases classified elsewhere: Secondary | ICD-10-CM | POA: Diagnosis not present

## 2019-08-01 DIAGNOSIS — E1151 Type 2 diabetes mellitus with diabetic peripheral angiopathy without gangrene: Secondary | ICD-10-CM | POA: Diagnosis not present

## 2019-08-01 DIAGNOSIS — B954 Other streptococcus as the cause of diseases classified elsewhere: Secondary | ICD-10-CM | POA: Diagnosis not present

## 2019-08-01 DIAGNOSIS — I480 Paroxysmal atrial fibrillation: Secondary | ICD-10-CM | POA: Diagnosis not present

## 2019-08-01 DIAGNOSIS — E1165 Type 2 diabetes mellitus with hyperglycemia: Secondary | ICD-10-CM | POA: Diagnosis not present

## 2019-08-01 DIAGNOSIS — B9561 Methicillin susceptible Staphylococcus aureus infection as the cause of diseases classified elsewhere: Secondary | ICD-10-CM | POA: Diagnosis not present

## 2019-08-03 DIAGNOSIS — E11628 Type 2 diabetes mellitus with other skin complications: Secondary | ICD-10-CM | POA: Diagnosis not present

## 2019-08-03 DIAGNOSIS — L089 Local infection of the skin and subcutaneous tissue, unspecified: Secondary | ICD-10-CM | POA: Diagnosis not present

## 2019-08-03 DIAGNOSIS — M869 Osteomyelitis, unspecified: Secondary | ICD-10-CM | POA: Diagnosis not present

## 2019-08-04 DIAGNOSIS — L02611 Cutaneous abscess of right foot: Secondary | ICD-10-CM | POA: Diagnosis not present

## 2019-08-04 DIAGNOSIS — I2510 Atherosclerotic heart disease of native coronary artery without angina pectoris: Secondary | ICD-10-CM | POA: Diagnosis not present

## 2019-08-04 DIAGNOSIS — E1169 Type 2 diabetes mellitus with other specified complication: Secondary | ICD-10-CM | POA: Diagnosis not present

## 2019-08-04 DIAGNOSIS — Z8249 Family history of ischemic heart disease and other diseases of the circulatory system: Secondary | ICD-10-CM | POA: Diagnosis not present

## 2019-08-04 DIAGNOSIS — A280 Pasteurellosis: Secondary | ICD-10-CM | POA: Diagnosis not present

## 2019-08-04 DIAGNOSIS — E1151 Type 2 diabetes mellitus with diabetic peripheral angiopathy without gangrene: Secondary | ICD-10-CM | POA: Diagnosis not present

## 2019-08-04 DIAGNOSIS — I4891 Unspecified atrial fibrillation: Secondary | ICD-10-CM | POA: Diagnosis not present

## 2019-08-04 DIAGNOSIS — M71071 Abscess of bursa, right ankle and foot: Secondary | ICD-10-CM | POA: Diagnosis not present

## 2019-08-04 DIAGNOSIS — B9561 Methicillin susceptible Staphylococcus aureus infection as the cause of diseases classified elsewhere: Secondary | ICD-10-CM | POA: Diagnosis not present

## 2019-08-04 DIAGNOSIS — K219 Gastro-esophageal reflux disease without esophagitis: Secondary | ICD-10-CM | POA: Diagnosis not present

## 2019-08-04 DIAGNOSIS — E1142 Type 2 diabetes mellitus with diabetic polyneuropathy: Secondary | ICD-10-CM | POA: Diagnosis not present

## 2019-08-04 DIAGNOSIS — Z20822 Contact with and (suspected) exposure to covid-19: Secondary | ICD-10-CM | POA: Diagnosis not present

## 2019-08-04 DIAGNOSIS — T8149XA Infection following a procedure, other surgical site, initial encounter: Secondary | ICD-10-CM | POA: Diagnosis not present

## 2019-08-04 DIAGNOSIS — Z792 Long term (current) use of antibiotics: Secondary | ICD-10-CM | POA: Diagnosis not present

## 2019-08-04 DIAGNOSIS — I480 Paroxysmal atrial fibrillation: Secondary | ICD-10-CM | POA: Diagnosis not present

## 2019-08-04 DIAGNOSIS — E11628 Type 2 diabetes mellitus with other skin complications: Secondary | ICD-10-CM | POA: Diagnosis not present

## 2019-08-04 DIAGNOSIS — G8929 Other chronic pain: Secondary | ICD-10-CM | POA: Diagnosis not present

## 2019-08-04 DIAGNOSIS — G4700 Insomnia, unspecified: Secondary | ICD-10-CM | POA: Diagnosis not present

## 2019-08-04 DIAGNOSIS — E11621 Type 2 diabetes mellitus with foot ulcer: Secondary | ICD-10-CM | POA: Diagnosis not present

## 2019-08-04 DIAGNOSIS — Z825 Family history of asthma and other chronic lower respiratory diseases: Secondary | ICD-10-CM | POA: Diagnosis not present

## 2019-08-04 DIAGNOSIS — I129 Hypertensive chronic kidney disease with stage 1 through stage 4 chronic kidney disease, or unspecified chronic kidney disease: Secondary | ICD-10-CM | POA: Diagnosis not present

## 2019-08-04 DIAGNOSIS — L97511 Non-pressure chronic ulcer of other part of right foot limited to breakdown of skin: Secondary | ICD-10-CM | POA: Diagnosis not present

## 2019-08-04 DIAGNOSIS — B954 Other streptococcus as the cause of diseases classified elsewhere: Secondary | ICD-10-CM | POA: Diagnosis not present

## 2019-08-04 DIAGNOSIS — Z7982 Long term (current) use of aspirin: Secondary | ICD-10-CM | POA: Diagnosis not present

## 2019-08-04 DIAGNOSIS — Z7901 Long term (current) use of anticoagulants: Secondary | ICD-10-CM | POA: Diagnosis not present

## 2019-08-04 DIAGNOSIS — A499 Bacterial infection, unspecified: Secondary | ICD-10-CM | POA: Diagnosis not present

## 2019-08-04 DIAGNOSIS — Z89431 Acquired absence of right foot: Secondary | ICD-10-CM | POA: Diagnosis not present

## 2019-08-04 DIAGNOSIS — E785 Hyperlipidemia, unspecified: Secondary | ICD-10-CM | POA: Diagnosis not present

## 2019-08-04 DIAGNOSIS — E1122 Type 2 diabetes mellitus with diabetic chronic kidney disease: Secondary | ICD-10-CM | POA: Diagnosis not present

## 2019-08-04 DIAGNOSIS — A0472 Enterocolitis due to Clostridium difficile, not specified as recurrent: Secondary | ICD-10-CM | POA: Diagnosis not present

## 2019-08-04 DIAGNOSIS — Z86711 Personal history of pulmonary embolism: Secondary | ICD-10-CM | POA: Diagnosis not present

## 2019-08-04 DIAGNOSIS — M5116 Intervertebral disc disorders with radiculopathy, lumbar region: Secondary | ICD-10-CM | POA: Diagnosis not present

## 2019-08-04 DIAGNOSIS — B9689 Other specified bacterial agents as the cause of diseases classified elsewhere: Secondary | ICD-10-CM | POA: Diagnosis not present

## 2019-08-04 DIAGNOSIS — L089 Local infection of the skin and subcutaneous tissue, unspecified: Secondary | ICD-10-CM | POA: Diagnosis not present

## 2019-08-04 DIAGNOSIS — I10 Essential (primary) hypertension: Secondary | ICD-10-CM | POA: Diagnosis not present

## 2019-08-04 DIAGNOSIS — N183 Chronic kidney disease, stage 3 unspecified: Secondary | ICD-10-CM | POA: Diagnosis not present

## 2019-08-04 DIAGNOSIS — Z452 Encounter for adjustment and management of vascular access device: Secondary | ICD-10-CM | POA: Diagnosis not present

## 2019-08-04 DIAGNOSIS — E1165 Type 2 diabetes mellitus with hyperglycemia: Secondary | ICD-10-CM | POA: Diagnosis not present

## 2019-08-04 DIAGNOSIS — D631 Anemia in chronic kidney disease: Secondary | ICD-10-CM | POA: Diagnosis not present

## 2019-08-04 DIAGNOSIS — L03115 Cellulitis of right lower limb: Secondary | ICD-10-CM | POA: Diagnosis not present

## 2019-08-04 DIAGNOSIS — T8141XA Infection following a procedure, superficial incisional surgical site, initial encounter: Secondary | ICD-10-CM | POA: Diagnosis not present

## 2019-08-04 DIAGNOSIS — Z961 Presence of intraocular lens: Secondary | ICD-10-CM | POA: Diagnosis not present

## 2019-08-04 DIAGNOSIS — Z794 Long term (current) use of insulin: Secondary | ICD-10-CM | POA: Diagnosis not present

## 2019-08-04 DIAGNOSIS — Z5181 Encounter for therapeutic drug level monitoring: Secondary | ICD-10-CM | POA: Diagnosis not present

## 2019-08-04 DIAGNOSIS — M868X7 Other osteomyelitis, ankle and foot: Secondary | ICD-10-CM | POA: Diagnosis not present

## 2019-08-04 DIAGNOSIS — T8789 Other complications of amputation stump: Secondary | ICD-10-CM | POA: Diagnosis not present

## 2019-08-15 DIAGNOSIS — Z7901 Long term (current) use of anticoagulants: Secondary | ICD-10-CM | POA: Diagnosis not present

## 2019-08-15 DIAGNOSIS — E119 Type 2 diabetes mellitus without complications: Secondary | ICD-10-CM | POA: Diagnosis not present

## 2019-08-15 DIAGNOSIS — R251 Tremor, unspecified: Secondary | ICD-10-CM | POA: Diagnosis not present

## 2019-08-15 DIAGNOSIS — Z79899 Other long term (current) drug therapy: Secondary | ICD-10-CM | POA: Diagnosis not present

## 2019-08-15 DIAGNOSIS — L089 Local infection of the skin and subcutaneous tissue, unspecified: Secondary | ICD-10-CM | POA: Diagnosis not present

## 2019-08-15 DIAGNOSIS — I480 Paroxysmal atrial fibrillation: Secondary | ICD-10-CM | POA: Diagnosis not present

## 2019-08-15 DIAGNOSIS — R4789 Other speech disturbances: Secondary | ICD-10-CM | POA: Diagnosis not present

## 2019-08-15 DIAGNOSIS — I6782 Cerebral ischemia: Secondary | ICD-10-CM | POA: Diagnosis not present

## 2019-08-15 DIAGNOSIS — Z794 Long term (current) use of insulin: Secondary | ICD-10-CM | POA: Diagnosis not present

## 2019-08-15 DIAGNOSIS — M869 Osteomyelitis, unspecified: Secondary | ICD-10-CM | POA: Diagnosis not present

## 2019-08-15 DIAGNOSIS — R110 Nausea: Secondary | ICD-10-CM | POA: Diagnosis not present

## 2019-08-15 DIAGNOSIS — G319 Degenerative disease of nervous system, unspecified: Secondary | ICD-10-CM | POA: Diagnosis not present

## 2019-08-15 DIAGNOSIS — Z765 Malingerer [conscious simulation]: Secondary | ICD-10-CM | POA: Diagnosis not present

## 2019-08-15 DIAGNOSIS — E11628 Type 2 diabetes mellitus with other skin complications: Secondary | ICD-10-CM | POA: Diagnosis not present

## 2019-08-15 DIAGNOSIS — R253 Fasciculation: Secondary | ICD-10-CM | POA: Diagnosis not present

## 2019-08-15 DIAGNOSIS — I10 Essential (primary) hypertension: Secondary | ICD-10-CM | POA: Diagnosis not present

## 2019-08-16 DIAGNOSIS — M5116 Intervertebral disc disorders with radiculopathy, lumbar region: Secondary | ICD-10-CM | POA: Diagnosis not present

## 2019-08-16 DIAGNOSIS — Z792 Long term (current) use of antibiotics: Secondary | ICD-10-CM | POA: Diagnosis not present

## 2019-08-16 DIAGNOSIS — E1122 Type 2 diabetes mellitus with diabetic chronic kidney disease: Secondary | ICD-10-CM | POA: Diagnosis not present

## 2019-08-16 DIAGNOSIS — Z5181 Encounter for therapeutic drug level monitoring: Secondary | ICD-10-CM | POA: Diagnosis not present

## 2019-08-16 DIAGNOSIS — L03115 Cellulitis of right lower limb: Secondary | ICD-10-CM | POA: Diagnosis not present

## 2019-08-16 DIAGNOSIS — D631 Anemia in chronic kidney disease: Secondary | ICD-10-CM | POA: Diagnosis not present

## 2019-08-16 DIAGNOSIS — M868X7 Other osteomyelitis, ankle and foot: Secondary | ICD-10-CM | POA: Diagnosis not present

## 2019-08-16 DIAGNOSIS — Z794 Long term (current) use of insulin: Secondary | ICD-10-CM | POA: Diagnosis not present

## 2019-08-16 DIAGNOSIS — G8929 Other chronic pain: Secondary | ICD-10-CM | POA: Diagnosis not present

## 2019-08-16 DIAGNOSIS — B954 Other streptococcus as the cause of diseases classified elsewhere: Secondary | ICD-10-CM | POA: Diagnosis not present

## 2019-08-16 DIAGNOSIS — B9561 Methicillin susceptible Staphylococcus aureus infection as the cause of diseases classified elsewhere: Secondary | ICD-10-CM | POA: Diagnosis not present

## 2019-08-16 DIAGNOSIS — E1169 Type 2 diabetes mellitus with other specified complication: Secondary | ICD-10-CM | POA: Diagnosis not present

## 2019-08-16 DIAGNOSIS — E1142 Type 2 diabetes mellitus with diabetic polyneuropathy: Secondary | ICD-10-CM | POA: Diagnosis not present

## 2019-08-16 DIAGNOSIS — N183 Chronic kidney disease, stage 3 unspecified: Secondary | ICD-10-CM | POA: Diagnosis not present

## 2019-08-16 DIAGNOSIS — E1165 Type 2 diabetes mellitus with hyperglycemia: Secondary | ICD-10-CM | POA: Diagnosis not present

## 2019-08-16 DIAGNOSIS — T8789 Other complications of amputation stump: Secondary | ICD-10-CM | POA: Diagnosis not present

## 2019-08-16 DIAGNOSIS — B9689 Other specified bacterial agents as the cause of diseases classified elsewhere: Secondary | ICD-10-CM | POA: Diagnosis not present

## 2019-08-16 DIAGNOSIS — I129 Hypertensive chronic kidney disease with stage 1 through stage 4 chronic kidney disease, or unspecified chronic kidney disease: Secondary | ICD-10-CM | POA: Diagnosis not present

## 2019-08-16 DIAGNOSIS — Z452 Encounter for adjustment and management of vascular access device: Secondary | ICD-10-CM | POA: Diagnosis not present

## 2019-08-16 DIAGNOSIS — E1151 Type 2 diabetes mellitus with diabetic peripheral angiopathy without gangrene: Secondary | ICD-10-CM | POA: Diagnosis not present

## 2019-08-16 DIAGNOSIS — I2510 Atherosclerotic heart disease of native coronary artery without angina pectoris: Secondary | ICD-10-CM | POA: Diagnosis not present

## 2019-08-16 DIAGNOSIS — I480 Paroxysmal atrial fibrillation: Secondary | ICD-10-CM | POA: Diagnosis not present

## 2019-08-17 DIAGNOSIS — M868X7 Other osteomyelitis, ankle and foot: Secondary | ICD-10-CM | POA: Diagnosis not present

## 2019-08-17 DIAGNOSIS — E1165 Type 2 diabetes mellitus with hyperglycemia: Secondary | ICD-10-CM | POA: Diagnosis not present

## 2019-08-17 DIAGNOSIS — Z5181 Encounter for therapeutic drug level monitoring: Secondary | ICD-10-CM | POA: Diagnosis not present

## 2019-08-17 DIAGNOSIS — G8929 Other chronic pain: Secondary | ICD-10-CM | POA: Diagnosis not present

## 2019-08-17 DIAGNOSIS — Z452 Encounter for adjustment and management of vascular access device: Secondary | ICD-10-CM | POA: Diagnosis not present

## 2019-08-17 DIAGNOSIS — Z792 Long term (current) use of antibiotics: Secondary | ICD-10-CM | POA: Diagnosis not present

## 2019-08-17 DIAGNOSIS — I129 Hypertensive chronic kidney disease with stage 1 through stage 4 chronic kidney disease, or unspecified chronic kidney disease: Secondary | ICD-10-CM | POA: Diagnosis not present

## 2019-08-17 DIAGNOSIS — B954 Other streptococcus as the cause of diseases classified elsewhere: Secondary | ICD-10-CM | POA: Diagnosis not present

## 2019-08-17 DIAGNOSIS — B9689 Other specified bacterial agents as the cause of diseases classified elsewhere: Secondary | ICD-10-CM | POA: Diagnosis not present

## 2019-08-17 DIAGNOSIS — I2510 Atherosclerotic heart disease of native coronary artery without angina pectoris: Secondary | ICD-10-CM | POA: Diagnosis not present

## 2019-08-17 DIAGNOSIS — T8789 Other complications of amputation stump: Secondary | ICD-10-CM | POA: Diagnosis not present

## 2019-08-17 DIAGNOSIS — M5116 Intervertebral disc disorders with radiculopathy, lumbar region: Secondary | ICD-10-CM | POA: Diagnosis not present

## 2019-08-17 DIAGNOSIS — Z794 Long term (current) use of insulin: Secondary | ICD-10-CM | POA: Diagnosis not present

## 2019-08-17 DIAGNOSIS — D631 Anemia in chronic kidney disease: Secondary | ICD-10-CM | POA: Diagnosis not present

## 2019-08-17 DIAGNOSIS — N183 Chronic kidney disease, stage 3 unspecified: Secondary | ICD-10-CM | POA: Diagnosis not present

## 2019-08-17 DIAGNOSIS — B9561 Methicillin susceptible Staphylococcus aureus infection as the cause of diseases classified elsewhere: Secondary | ICD-10-CM | POA: Diagnosis not present

## 2019-08-17 DIAGNOSIS — L03115 Cellulitis of right lower limb: Secondary | ICD-10-CM | POA: Diagnosis not present

## 2019-08-17 DIAGNOSIS — E1122 Type 2 diabetes mellitus with diabetic chronic kidney disease: Secondary | ICD-10-CM | POA: Diagnosis not present

## 2019-08-17 DIAGNOSIS — I480 Paroxysmal atrial fibrillation: Secondary | ICD-10-CM | POA: Diagnosis not present

## 2019-08-17 DIAGNOSIS — E1169 Type 2 diabetes mellitus with other specified complication: Secondary | ICD-10-CM | POA: Diagnosis not present

## 2019-08-17 DIAGNOSIS — E1151 Type 2 diabetes mellitus with diabetic peripheral angiopathy without gangrene: Secondary | ICD-10-CM | POA: Diagnosis not present

## 2019-08-17 DIAGNOSIS — E1142 Type 2 diabetes mellitus with diabetic polyneuropathy: Secondary | ICD-10-CM | POA: Diagnosis not present

## 2019-08-19 DIAGNOSIS — B9689 Other specified bacterial agents as the cause of diseases classified elsewhere: Secondary | ICD-10-CM | POA: Diagnosis not present

## 2019-08-19 DIAGNOSIS — E1122 Type 2 diabetes mellitus with diabetic chronic kidney disease: Secondary | ICD-10-CM | POA: Diagnosis not present

## 2019-08-19 DIAGNOSIS — I480 Paroxysmal atrial fibrillation: Secondary | ICD-10-CM | POA: Diagnosis not present

## 2019-08-19 DIAGNOSIS — N183 Chronic kidney disease, stage 3 unspecified: Secondary | ICD-10-CM | POA: Diagnosis not present

## 2019-08-19 DIAGNOSIS — E1169 Type 2 diabetes mellitus with other specified complication: Secondary | ICD-10-CM | POA: Diagnosis not present

## 2019-08-19 DIAGNOSIS — L03115 Cellulitis of right lower limb: Secondary | ICD-10-CM | POA: Diagnosis not present

## 2019-08-19 DIAGNOSIS — B9561 Methicillin susceptible Staphylococcus aureus infection as the cause of diseases classified elsewhere: Secondary | ICD-10-CM | POA: Diagnosis not present

## 2019-08-19 DIAGNOSIS — Z792 Long term (current) use of antibiotics: Secondary | ICD-10-CM | POA: Diagnosis not present

## 2019-08-19 DIAGNOSIS — E1151 Type 2 diabetes mellitus with diabetic peripheral angiopathy without gangrene: Secondary | ICD-10-CM | POA: Diagnosis not present

## 2019-08-19 DIAGNOSIS — Z5181 Encounter for therapeutic drug level monitoring: Secondary | ICD-10-CM | POA: Diagnosis not present

## 2019-08-19 DIAGNOSIS — M868X7 Other osteomyelitis, ankle and foot: Secondary | ICD-10-CM | POA: Diagnosis not present

## 2019-08-19 DIAGNOSIS — T8789 Other complications of amputation stump: Secondary | ICD-10-CM | POA: Diagnosis not present

## 2019-08-19 DIAGNOSIS — B954 Other streptococcus as the cause of diseases classified elsewhere: Secondary | ICD-10-CM | POA: Diagnosis not present

## 2019-08-19 DIAGNOSIS — M5116 Intervertebral disc disorders with radiculopathy, lumbar region: Secondary | ICD-10-CM | POA: Diagnosis not present

## 2019-08-19 DIAGNOSIS — I2510 Atherosclerotic heart disease of native coronary artery without angina pectoris: Secondary | ICD-10-CM | POA: Diagnosis not present

## 2019-08-19 DIAGNOSIS — D631 Anemia in chronic kidney disease: Secondary | ICD-10-CM | POA: Diagnosis not present

## 2019-08-19 DIAGNOSIS — E1165 Type 2 diabetes mellitus with hyperglycemia: Secondary | ICD-10-CM | POA: Diagnosis not present

## 2019-08-19 DIAGNOSIS — Z452 Encounter for adjustment and management of vascular access device: Secondary | ICD-10-CM | POA: Diagnosis not present

## 2019-08-19 DIAGNOSIS — G8929 Other chronic pain: Secondary | ICD-10-CM | POA: Diagnosis not present

## 2019-08-19 DIAGNOSIS — Z794 Long term (current) use of insulin: Secondary | ICD-10-CM | POA: Diagnosis not present

## 2019-08-19 DIAGNOSIS — E1142 Type 2 diabetes mellitus with diabetic polyneuropathy: Secondary | ICD-10-CM | POA: Diagnosis not present

## 2019-08-19 DIAGNOSIS — I129 Hypertensive chronic kidney disease with stage 1 through stage 4 chronic kidney disease, or unspecified chronic kidney disease: Secondary | ICD-10-CM | POA: Diagnosis not present

## 2019-08-21 DIAGNOSIS — I10 Essential (primary) hypertension: Secondary | ICD-10-CM | POA: Diagnosis not present

## 2019-08-21 DIAGNOSIS — E785 Hyperlipidemia, unspecified: Secondary | ICD-10-CM | POA: Diagnosis not present

## 2019-08-22 DIAGNOSIS — Z794 Long term (current) use of insulin: Secondary | ICD-10-CM | POA: Diagnosis not present

## 2019-08-22 DIAGNOSIS — N183 Chronic kidney disease, stage 3 unspecified: Secondary | ICD-10-CM | POA: Diagnosis not present

## 2019-08-22 DIAGNOSIS — M5116 Intervertebral disc disorders with radiculopathy, lumbar region: Secondary | ICD-10-CM | POA: Diagnosis not present

## 2019-08-22 DIAGNOSIS — E1142 Type 2 diabetes mellitus with diabetic polyneuropathy: Secondary | ICD-10-CM | POA: Diagnosis not present

## 2019-08-22 DIAGNOSIS — E1122 Type 2 diabetes mellitus with diabetic chronic kidney disease: Secondary | ICD-10-CM | POA: Diagnosis not present

## 2019-08-22 DIAGNOSIS — E1169 Type 2 diabetes mellitus with other specified complication: Secondary | ICD-10-CM | POA: Diagnosis not present

## 2019-08-22 DIAGNOSIS — B9561 Methicillin susceptible Staphylococcus aureus infection as the cause of diseases classified elsewhere: Secondary | ICD-10-CM | POA: Diagnosis not present

## 2019-08-22 DIAGNOSIS — Z452 Encounter for adjustment and management of vascular access device: Secondary | ICD-10-CM | POA: Diagnosis not present

## 2019-08-22 DIAGNOSIS — B954 Other streptococcus as the cause of diseases classified elsewhere: Secondary | ICD-10-CM | POA: Diagnosis not present

## 2019-08-22 DIAGNOSIS — T8789 Other complications of amputation stump: Secondary | ICD-10-CM | POA: Diagnosis not present

## 2019-08-22 DIAGNOSIS — I480 Paroxysmal atrial fibrillation: Secondary | ICD-10-CM | POA: Diagnosis not present

## 2019-08-22 DIAGNOSIS — I2510 Atherosclerotic heart disease of native coronary artery without angina pectoris: Secondary | ICD-10-CM | POA: Diagnosis not present

## 2019-08-22 DIAGNOSIS — I129 Hypertensive chronic kidney disease with stage 1 through stage 4 chronic kidney disease, or unspecified chronic kidney disease: Secondary | ICD-10-CM | POA: Diagnosis not present

## 2019-08-22 DIAGNOSIS — M868X7 Other osteomyelitis, ankle and foot: Secondary | ICD-10-CM | POA: Diagnosis not present

## 2019-08-22 DIAGNOSIS — G8929 Other chronic pain: Secondary | ICD-10-CM | POA: Diagnosis not present

## 2019-08-22 DIAGNOSIS — L03115 Cellulitis of right lower limb: Secondary | ICD-10-CM | POA: Diagnosis not present

## 2019-08-22 DIAGNOSIS — D631 Anemia in chronic kidney disease: Secondary | ICD-10-CM | POA: Diagnosis not present

## 2019-08-22 DIAGNOSIS — E1165 Type 2 diabetes mellitus with hyperglycemia: Secondary | ICD-10-CM | POA: Diagnosis not present

## 2019-08-22 DIAGNOSIS — Z5181 Encounter for therapeutic drug level monitoring: Secondary | ICD-10-CM | POA: Diagnosis not present

## 2019-08-22 DIAGNOSIS — E1151 Type 2 diabetes mellitus with diabetic peripheral angiopathy without gangrene: Secondary | ICD-10-CM | POA: Diagnosis not present

## 2019-08-22 DIAGNOSIS — B9689 Other specified bacterial agents as the cause of diseases classified elsewhere: Secondary | ICD-10-CM | POA: Diagnosis not present

## 2019-08-22 DIAGNOSIS — Z792 Long term (current) use of antibiotics: Secondary | ICD-10-CM | POA: Diagnosis not present

## 2019-08-24 DIAGNOSIS — Z792 Long term (current) use of antibiotics: Secondary | ICD-10-CM | POA: Diagnosis not present

## 2019-08-24 DIAGNOSIS — E1151 Type 2 diabetes mellitus with diabetic peripheral angiopathy without gangrene: Secondary | ICD-10-CM | POA: Diagnosis not present

## 2019-08-24 DIAGNOSIS — E1142 Type 2 diabetes mellitus with diabetic polyneuropathy: Secondary | ICD-10-CM | POA: Diagnosis not present

## 2019-08-24 DIAGNOSIS — Z794 Long term (current) use of insulin: Secondary | ICD-10-CM | POA: Diagnosis not present

## 2019-08-24 DIAGNOSIS — M5116 Intervertebral disc disorders with radiculopathy, lumbar region: Secondary | ICD-10-CM | POA: Diagnosis not present

## 2019-08-24 DIAGNOSIS — M868X7 Other osteomyelitis, ankle and foot: Secondary | ICD-10-CM | POA: Diagnosis not present

## 2019-08-24 DIAGNOSIS — I480 Paroxysmal atrial fibrillation: Secondary | ICD-10-CM | POA: Diagnosis not present

## 2019-08-24 DIAGNOSIS — E1165 Type 2 diabetes mellitus with hyperglycemia: Secondary | ICD-10-CM | POA: Diagnosis not present

## 2019-08-24 DIAGNOSIS — I2510 Atherosclerotic heart disease of native coronary artery without angina pectoris: Secondary | ICD-10-CM | POA: Diagnosis not present

## 2019-08-24 DIAGNOSIS — Z5181 Encounter for therapeutic drug level monitoring: Secondary | ICD-10-CM | POA: Diagnosis not present

## 2019-08-24 DIAGNOSIS — B9689 Other specified bacterial agents as the cause of diseases classified elsewhere: Secondary | ICD-10-CM | POA: Diagnosis not present

## 2019-08-24 DIAGNOSIS — L03115 Cellulitis of right lower limb: Secondary | ICD-10-CM | POA: Diagnosis not present

## 2019-08-24 DIAGNOSIS — E1169 Type 2 diabetes mellitus with other specified complication: Secondary | ICD-10-CM | POA: Diagnosis not present

## 2019-08-24 DIAGNOSIS — G8929 Other chronic pain: Secondary | ICD-10-CM | POA: Diagnosis not present

## 2019-08-24 DIAGNOSIS — D631 Anemia in chronic kidney disease: Secondary | ICD-10-CM | POA: Diagnosis not present

## 2019-08-24 DIAGNOSIS — T8789 Other complications of amputation stump: Secondary | ICD-10-CM | POA: Diagnosis not present

## 2019-08-24 DIAGNOSIS — Z452 Encounter for adjustment and management of vascular access device: Secondary | ICD-10-CM | POA: Diagnosis not present

## 2019-08-24 DIAGNOSIS — B954 Other streptococcus as the cause of diseases classified elsewhere: Secondary | ICD-10-CM | POA: Diagnosis not present

## 2019-08-24 DIAGNOSIS — N183 Chronic kidney disease, stage 3 unspecified: Secondary | ICD-10-CM | POA: Diagnosis not present

## 2019-08-24 DIAGNOSIS — E1122 Type 2 diabetes mellitus with diabetic chronic kidney disease: Secondary | ICD-10-CM | POA: Diagnosis not present

## 2019-08-24 DIAGNOSIS — B9561 Methicillin susceptible Staphylococcus aureus infection as the cause of diseases classified elsewhere: Secondary | ICD-10-CM | POA: Diagnosis not present

## 2019-08-24 DIAGNOSIS — I129 Hypertensive chronic kidney disease with stage 1 through stage 4 chronic kidney disease, or unspecified chronic kidney disease: Secondary | ICD-10-CM | POA: Diagnosis not present

## 2019-08-25 DIAGNOSIS — L97415 Non-pressure chronic ulcer of right heel and midfoot with muscle involvement without evidence of necrosis: Secondary | ICD-10-CM | POA: Diagnosis not present

## 2019-08-25 DIAGNOSIS — E118 Type 2 diabetes mellitus with unspecified complications: Secondary | ICD-10-CM | POA: Diagnosis not present

## 2019-08-25 DIAGNOSIS — E11621 Type 2 diabetes mellitus with foot ulcer: Secondary | ICD-10-CM | POA: Diagnosis not present

## 2019-08-25 DIAGNOSIS — L03115 Cellulitis of right lower limb: Secondary | ICD-10-CM | POA: Diagnosis not present

## 2019-08-25 DIAGNOSIS — Z89422 Acquired absence of other left toe(s): Secondary | ICD-10-CM | POA: Diagnosis not present

## 2019-08-25 DIAGNOSIS — M25512 Pain in left shoulder: Secondary | ICD-10-CM | POA: Diagnosis not present

## 2019-08-25 DIAGNOSIS — Z89431 Acquired absence of right foot: Secondary | ICD-10-CM | POA: Diagnosis not present

## 2019-08-25 DIAGNOSIS — R2689 Other abnormalities of gait and mobility: Secondary | ICD-10-CM | POA: Diagnosis not present

## 2019-08-25 DIAGNOSIS — M869 Osteomyelitis, unspecified: Secondary | ICD-10-CM | POA: Diagnosis not present

## 2019-08-25 DIAGNOSIS — L089 Local infection of the skin and subcutaneous tissue, unspecified: Secondary | ICD-10-CM | POA: Diagnosis not present

## 2019-08-25 DIAGNOSIS — E11628 Type 2 diabetes mellitus with other skin complications: Secondary | ICD-10-CM | POA: Diagnosis not present

## 2019-08-26 DIAGNOSIS — N183 Chronic kidney disease, stage 3 unspecified: Secondary | ICD-10-CM | POA: Diagnosis not present

## 2019-08-26 DIAGNOSIS — M868X7 Other osteomyelitis, ankle and foot: Secondary | ICD-10-CM | POA: Diagnosis not present

## 2019-08-26 DIAGNOSIS — E1165 Type 2 diabetes mellitus with hyperglycemia: Secondary | ICD-10-CM | POA: Diagnosis not present

## 2019-08-26 DIAGNOSIS — M5116 Intervertebral disc disorders with radiculopathy, lumbar region: Secondary | ICD-10-CM | POA: Diagnosis not present

## 2019-08-26 DIAGNOSIS — Z792 Long term (current) use of antibiotics: Secondary | ICD-10-CM | POA: Diagnosis not present

## 2019-08-26 DIAGNOSIS — E1169 Type 2 diabetes mellitus with other specified complication: Secondary | ICD-10-CM | POA: Diagnosis not present

## 2019-08-26 DIAGNOSIS — Z794 Long term (current) use of insulin: Secondary | ICD-10-CM | POA: Diagnosis not present

## 2019-08-26 DIAGNOSIS — L03115 Cellulitis of right lower limb: Secondary | ICD-10-CM | POA: Diagnosis not present

## 2019-08-26 DIAGNOSIS — E1142 Type 2 diabetes mellitus with diabetic polyneuropathy: Secondary | ICD-10-CM | POA: Diagnosis not present

## 2019-08-26 DIAGNOSIS — I2510 Atherosclerotic heart disease of native coronary artery without angina pectoris: Secondary | ICD-10-CM | POA: Diagnosis not present

## 2019-08-26 DIAGNOSIS — E1122 Type 2 diabetes mellitus with diabetic chronic kidney disease: Secondary | ICD-10-CM | POA: Diagnosis not present

## 2019-08-26 DIAGNOSIS — D631 Anemia in chronic kidney disease: Secondary | ICD-10-CM | POA: Diagnosis not present

## 2019-08-26 DIAGNOSIS — I129 Hypertensive chronic kidney disease with stage 1 through stage 4 chronic kidney disease, or unspecified chronic kidney disease: Secondary | ICD-10-CM | POA: Diagnosis not present

## 2019-08-26 DIAGNOSIS — G8929 Other chronic pain: Secondary | ICD-10-CM | POA: Diagnosis not present

## 2019-08-26 DIAGNOSIS — B9561 Methicillin susceptible Staphylococcus aureus infection as the cause of diseases classified elsewhere: Secondary | ICD-10-CM | POA: Diagnosis not present

## 2019-08-26 DIAGNOSIS — B9689 Other specified bacterial agents as the cause of diseases classified elsewhere: Secondary | ICD-10-CM | POA: Diagnosis not present

## 2019-08-26 DIAGNOSIS — E1151 Type 2 diabetes mellitus with diabetic peripheral angiopathy without gangrene: Secondary | ICD-10-CM | POA: Diagnosis not present

## 2019-08-26 DIAGNOSIS — Z452 Encounter for adjustment and management of vascular access device: Secondary | ICD-10-CM | POA: Diagnosis not present

## 2019-08-26 DIAGNOSIS — Z5181 Encounter for therapeutic drug level monitoring: Secondary | ICD-10-CM | POA: Diagnosis not present

## 2019-08-26 DIAGNOSIS — I480 Paroxysmal atrial fibrillation: Secondary | ICD-10-CM | POA: Diagnosis not present

## 2019-08-26 DIAGNOSIS — B954 Other streptococcus as the cause of diseases classified elsewhere: Secondary | ICD-10-CM | POA: Diagnosis not present

## 2019-08-26 DIAGNOSIS — T8789 Other complications of amputation stump: Secondary | ICD-10-CM | POA: Diagnosis not present

## 2019-08-29 DIAGNOSIS — N183 Chronic kidney disease, stage 3 unspecified: Secondary | ICD-10-CM | POA: Diagnosis not present

## 2019-08-29 DIAGNOSIS — Z5181 Encounter for therapeutic drug level monitoring: Secondary | ICD-10-CM | POA: Diagnosis not present

## 2019-08-29 DIAGNOSIS — B954 Other streptococcus as the cause of diseases classified elsewhere: Secondary | ICD-10-CM | POA: Diagnosis not present

## 2019-08-29 DIAGNOSIS — E1169 Type 2 diabetes mellitus with other specified complication: Secondary | ICD-10-CM | POA: Diagnosis not present

## 2019-08-29 DIAGNOSIS — I2510 Atherosclerotic heart disease of native coronary artery without angina pectoris: Secondary | ICD-10-CM | POA: Diagnosis not present

## 2019-08-29 DIAGNOSIS — E1142 Type 2 diabetes mellitus with diabetic polyneuropathy: Secondary | ICD-10-CM | POA: Diagnosis not present

## 2019-08-29 DIAGNOSIS — B9689 Other specified bacterial agents as the cause of diseases classified elsewhere: Secondary | ICD-10-CM | POA: Diagnosis not present

## 2019-08-29 DIAGNOSIS — M5116 Intervertebral disc disorders with radiculopathy, lumbar region: Secondary | ICD-10-CM | POA: Diagnosis not present

## 2019-08-29 DIAGNOSIS — E1151 Type 2 diabetes mellitus with diabetic peripheral angiopathy without gangrene: Secondary | ICD-10-CM | POA: Diagnosis not present

## 2019-08-29 DIAGNOSIS — B9561 Methicillin susceptible Staphylococcus aureus infection as the cause of diseases classified elsewhere: Secondary | ICD-10-CM | POA: Diagnosis not present

## 2019-08-29 DIAGNOSIS — G8929 Other chronic pain: Secondary | ICD-10-CM | POA: Diagnosis not present

## 2019-08-29 DIAGNOSIS — D631 Anemia in chronic kidney disease: Secondary | ICD-10-CM | POA: Diagnosis not present

## 2019-08-29 DIAGNOSIS — M868X7 Other osteomyelitis, ankle and foot: Secondary | ICD-10-CM | POA: Diagnosis not present

## 2019-08-29 DIAGNOSIS — Z794 Long term (current) use of insulin: Secondary | ICD-10-CM | POA: Diagnosis not present

## 2019-08-29 DIAGNOSIS — T8789 Other complications of amputation stump: Secondary | ICD-10-CM | POA: Diagnosis not present

## 2019-08-29 DIAGNOSIS — I480 Paroxysmal atrial fibrillation: Secondary | ICD-10-CM | POA: Diagnosis not present

## 2019-08-29 DIAGNOSIS — E1122 Type 2 diabetes mellitus with diabetic chronic kidney disease: Secondary | ICD-10-CM | POA: Diagnosis not present

## 2019-08-29 DIAGNOSIS — Z792 Long term (current) use of antibiotics: Secondary | ICD-10-CM | POA: Diagnosis not present

## 2019-08-29 DIAGNOSIS — E1165 Type 2 diabetes mellitus with hyperglycemia: Secondary | ICD-10-CM | POA: Diagnosis not present

## 2019-08-29 DIAGNOSIS — I129 Hypertensive chronic kidney disease with stage 1 through stage 4 chronic kidney disease, or unspecified chronic kidney disease: Secondary | ICD-10-CM | POA: Diagnosis not present

## 2019-08-29 DIAGNOSIS — L03115 Cellulitis of right lower limb: Secondary | ICD-10-CM | POA: Diagnosis not present

## 2019-08-29 DIAGNOSIS — Z452 Encounter for adjustment and management of vascular access device: Secondary | ICD-10-CM | POA: Diagnosis not present

## 2019-08-31 DIAGNOSIS — L03115 Cellulitis of right lower limb: Secondary | ICD-10-CM | POA: Diagnosis not present

## 2019-08-31 DIAGNOSIS — E1169 Type 2 diabetes mellitus with other specified complication: Secondary | ICD-10-CM | POA: Diagnosis not present

## 2019-08-31 DIAGNOSIS — E1165 Type 2 diabetes mellitus with hyperglycemia: Secondary | ICD-10-CM | POA: Diagnosis not present

## 2019-08-31 DIAGNOSIS — M5116 Intervertebral disc disorders with radiculopathy, lumbar region: Secondary | ICD-10-CM | POA: Diagnosis not present

## 2019-08-31 DIAGNOSIS — I129 Hypertensive chronic kidney disease with stage 1 through stage 4 chronic kidney disease, or unspecified chronic kidney disease: Secondary | ICD-10-CM | POA: Diagnosis not present

## 2019-08-31 DIAGNOSIS — T8789 Other complications of amputation stump: Secondary | ICD-10-CM | POA: Diagnosis not present

## 2019-08-31 DIAGNOSIS — B954 Other streptococcus as the cause of diseases classified elsewhere: Secondary | ICD-10-CM | POA: Diagnosis not present

## 2019-08-31 DIAGNOSIS — Z792 Long term (current) use of antibiotics: Secondary | ICD-10-CM | POA: Diagnosis not present

## 2019-08-31 DIAGNOSIS — E1122 Type 2 diabetes mellitus with diabetic chronic kidney disease: Secondary | ICD-10-CM | POA: Diagnosis not present

## 2019-08-31 DIAGNOSIS — D631 Anemia in chronic kidney disease: Secondary | ICD-10-CM | POA: Diagnosis not present

## 2019-08-31 DIAGNOSIS — Z452 Encounter for adjustment and management of vascular access device: Secondary | ICD-10-CM | POA: Diagnosis not present

## 2019-08-31 DIAGNOSIS — Z794 Long term (current) use of insulin: Secondary | ICD-10-CM | POA: Diagnosis not present

## 2019-08-31 DIAGNOSIS — I480 Paroxysmal atrial fibrillation: Secondary | ICD-10-CM | POA: Diagnosis not present

## 2019-08-31 DIAGNOSIS — B9561 Methicillin susceptible Staphylococcus aureus infection as the cause of diseases classified elsewhere: Secondary | ICD-10-CM | POA: Diagnosis not present

## 2019-08-31 DIAGNOSIS — N183 Chronic kidney disease, stage 3 unspecified: Secondary | ICD-10-CM | POA: Diagnosis not present

## 2019-08-31 DIAGNOSIS — I2510 Atherosclerotic heart disease of native coronary artery without angina pectoris: Secondary | ICD-10-CM | POA: Diagnosis not present

## 2019-08-31 DIAGNOSIS — M868X7 Other osteomyelitis, ankle and foot: Secondary | ICD-10-CM | POA: Diagnosis not present

## 2019-08-31 DIAGNOSIS — Z5181 Encounter for therapeutic drug level monitoring: Secondary | ICD-10-CM | POA: Diagnosis not present

## 2019-08-31 DIAGNOSIS — E1142 Type 2 diabetes mellitus with diabetic polyneuropathy: Secondary | ICD-10-CM | POA: Diagnosis not present

## 2019-08-31 DIAGNOSIS — E1151 Type 2 diabetes mellitus with diabetic peripheral angiopathy without gangrene: Secondary | ICD-10-CM | POA: Diagnosis not present

## 2019-08-31 DIAGNOSIS — B9689 Other specified bacterial agents as the cause of diseases classified elsewhere: Secondary | ICD-10-CM | POA: Diagnosis not present

## 2019-08-31 DIAGNOSIS — G8929 Other chronic pain: Secondary | ICD-10-CM | POA: Diagnosis not present

## 2019-09-01 DIAGNOSIS — L03115 Cellulitis of right lower limb: Secondary | ICD-10-CM | POA: Diagnosis not present

## 2019-09-01 DIAGNOSIS — G8929 Other chronic pain: Secondary | ICD-10-CM | POA: Diagnosis not present

## 2019-09-01 DIAGNOSIS — Z5181 Encounter for therapeutic drug level monitoring: Secondary | ICD-10-CM | POA: Diagnosis not present

## 2019-09-01 DIAGNOSIS — M5116 Intervertebral disc disorders with radiculopathy, lumbar region: Secondary | ICD-10-CM | POA: Diagnosis not present

## 2019-09-01 DIAGNOSIS — T8789 Other complications of amputation stump: Secondary | ICD-10-CM | POA: Diagnosis not present

## 2019-09-01 DIAGNOSIS — Z792 Long term (current) use of antibiotics: Secondary | ICD-10-CM | POA: Diagnosis not present

## 2019-09-01 DIAGNOSIS — Z452 Encounter for adjustment and management of vascular access device: Secondary | ICD-10-CM | POA: Diagnosis not present

## 2019-09-01 DIAGNOSIS — D631 Anemia in chronic kidney disease: Secondary | ICD-10-CM | POA: Diagnosis not present

## 2019-09-01 DIAGNOSIS — I480 Paroxysmal atrial fibrillation: Secondary | ICD-10-CM | POA: Diagnosis not present

## 2019-09-01 DIAGNOSIS — I129 Hypertensive chronic kidney disease with stage 1 through stage 4 chronic kidney disease, or unspecified chronic kidney disease: Secondary | ICD-10-CM | POA: Diagnosis not present

## 2019-09-01 DIAGNOSIS — N183 Chronic kidney disease, stage 3 unspecified: Secondary | ICD-10-CM | POA: Diagnosis not present

## 2019-09-01 DIAGNOSIS — I2510 Atherosclerotic heart disease of native coronary artery without angina pectoris: Secondary | ICD-10-CM | POA: Diagnosis not present

## 2019-09-01 DIAGNOSIS — E1165 Type 2 diabetes mellitus with hyperglycemia: Secondary | ICD-10-CM | POA: Diagnosis not present

## 2019-09-01 DIAGNOSIS — E1122 Type 2 diabetes mellitus with diabetic chronic kidney disease: Secondary | ICD-10-CM | POA: Diagnosis not present

## 2019-09-01 DIAGNOSIS — Z794 Long term (current) use of insulin: Secondary | ICD-10-CM | POA: Diagnosis not present

## 2019-09-01 DIAGNOSIS — E1142 Type 2 diabetes mellitus with diabetic polyneuropathy: Secondary | ICD-10-CM | POA: Diagnosis not present

## 2019-09-01 DIAGNOSIS — B954 Other streptococcus as the cause of diseases classified elsewhere: Secondary | ICD-10-CM | POA: Diagnosis not present

## 2019-09-01 DIAGNOSIS — E1151 Type 2 diabetes mellitus with diabetic peripheral angiopathy without gangrene: Secondary | ICD-10-CM | POA: Diagnosis not present

## 2019-09-01 DIAGNOSIS — E1169 Type 2 diabetes mellitus with other specified complication: Secondary | ICD-10-CM | POA: Diagnosis not present

## 2019-09-01 DIAGNOSIS — M868X7 Other osteomyelitis, ankle and foot: Secondary | ICD-10-CM | POA: Diagnosis not present

## 2019-09-01 DIAGNOSIS — B9561 Methicillin susceptible Staphylococcus aureus infection as the cause of diseases classified elsewhere: Secondary | ICD-10-CM | POA: Diagnosis not present

## 2019-09-01 DIAGNOSIS — B9689 Other specified bacterial agents as the cause of diseases classified elsewhere: Secondary | ICD-10-CM | POA: Diagnosis not present

## 2019-09-05 DIAGNOSIS — E1169 Type 2 diabetes mellitus with other specified complication: Secondary | ICD-10-CM | POA: Diagnosis not present

## 2019-09-05 DIAGNOSIS — E1142 Type 2 diabetes mellitus with diabetic polyneuropathy: Secondary | ICD-10-CM | POA: Diagnosis not present

## 2019-09-05 DIAGNOSIS — I129 Hypertensive chronic kidney disease with stage 1 through stage 4 chronic kidney disease, or unspecified chronic kidney disease: Secondary | ICD-10-CM | POA: Diagnosis not present

## 2019-09-05 DIAGNOSIS — Z794 Long term (current) use of insulin: Secondary | ICD-10-CM | POA: Diagnosis not present

## 2019-09-05 DIAGNOSIS — B9561 Methicillin susceptible Staphylococcus aureus infection as the cause of diseases classified elsewhere: Secondary | ICD-10-CM | POA: Diagnosis not present

## 2019-09-05 DIAGNOSIS — B9689 Other specified bacterial agents as the cause of diseases classified elsewhere: Secondary | ICD-10-CM | POA: Diagnosis not present

## 2019-09-05 DIAGNOSIS — M5116 Intervertebral disc disorders with radiculopathy, lumbar region: Secondary | ICD-10-CM | POA: Diagnosis not present

## 2019-09-05 DIAGNOSIS — E1151 Type 2 diabetes mellitus with diabetic peripheral angiopathy without gangrene: Secondary | ICD-10-CM | POA: Diagnosis not present

## 2019-09-05 DIAGNOSIS — Z452 Encounter for adjustment and management of vascular access device: Secondary | ICD-10-CM | POA: Diagnosis not present

## 2019-09-05 DIAGNOSIS — L03115 Cellulitis of right lower limb: Secondary | ICD-10-CM | POA: Diagnosis not present

## 2019-09-05 DIAGNOSIS — I2510 Atherosclerotic heart disease of native coronary artery without angina pectoris: Secondary | ICD-10-CM | POA: Diagnosis not present

## 2019-09-05 DIAGNOSIS — M868X7 Other osteomyelitis, ankle and foot: Secondary | ICD-10-CM | POA: Diagnosis not present

## 2019-09-05 DIAGNOSIS — D631 Anemia in chronic kidney disease: Secondary | ICD-10-CM | POA: Diagnosis not present

## 2019-09-05 DIAGNOSIS — Z5181 Encounter for therapeutic drug level monitoring: Secondary | ICD-10-CM | POA: Diagnosis not present

## 2019-09-05 DIAGNOSIS — E1122 Type 2 diabetes mellitus with diabetic chronic kidney disease: Secondary | ICD-10-CM | POA: Diagnosis not present

## 2019-09-05 DIAGNOSIS — T8789 Other complications of amputation stump: Secondary | ICD-10-CM | POA: Diagnosis not present

## 2019-09-05 DIAGNOSIS — Z792 Long term (current) use of antibiotics: Secondary | ICD-10-CM | POA: Diagnosis not present

## 2019-09-05 DIAGNOSIS — E1165 Type 2 diabetes mellitus with hyperglycemia: Secondary | ICD-10-CM | POA: Diagnosis not present

## 2019-09-05 DIAGNOSIS — G8929 Other chronic pain: Secondary | ICD-10-CM | POA: Diagnosis not present

## 2019-09-05 DIAGNOSIS — N183 Chronic kidney disease, stage 3 unspecified: Secondary | ICD-10-CM | POA: Diagnosis not present

## 2019-09-05 DIAGNOSIS — B954 Other streptococcus as the cause of diseases classified elsewhere: Secondary | ICD-10-CM | POA: Diagnosis not present

## 2019-09-05 DIAGNOSIS — I480 Paroxysmal atrial fibrillation: Secondary | ICD-10-CM | POA: Diagnosis not present

## 2019-09-07 DIAGNOSIS — B954 Other streptococcus as the cause of diseases classified elsewhere: Secondary | ICD-10-CM | POA: Diagnosis not present

## 2019-09-07 DIAGNOSIS — L03115 Cellulitis of right lower limb: Secondary | ICD-10-CM | POA: Diagnosis not present

## 2019-09-07 DIAGNOSIS — Z452 Encounter for adjustment and management of vascular access device: Secondary | ICD-10-CM | POA: Diagnosis not present

## 2019-09-07 DIAGNOSIS — E1122 Type 2 diabetes mellitus with diabetic chronic kidney disease: Secondary | ICD-10-CM | POA: Diagnosis not present

## 2019-09-07 DIAGNOSIS — I480 Paroxysmal atrial fibrillation: Secondary | ICD-10-CM | POA: Diagnosis not present

## 2019-09-07 DIAGNOSIS — I129 Hypertensive chronic kidney disease with stage 1 through stage 4 chronic kidney disease, or unspecified chronic kidney disease: Secondary | ICD-10-CM | POA: Diagnosis not present

## 2019-09-07 DIAGNOSIS — B9561 Methicillin susceptible Staphylococcus aureus infection as the cause of diseases classified elsewhere: Secondary | ICD-10-CM | POA: Diagnosis not present

## 2019-09-07 DIAGNOSIS — E1169 Type 2 diabetes mellitus with other specified complication: Secondary | ICD-10-CM | POA: Diagnosis not present

## 2019-09-07 DIAGNOSIS — B9689 Other specified bacterial agents as the cause of diseases classified elsewhere: Secondary | ICD-10-CM | POA: Diagnosis not present

## 2019-09-07 DIAGNOSIS — E1142 Type 2 diabetes mellitus with diabetic polyneuropathy: Secondary | ICD-10-CM | POA: Diagnosis not present

## 2019-09-07 DIAGNOSIS — G8929 Other chronic pain: Secondary | ICD-10-CM | POA: Diagnosis not present

## 2019-09-07 DIAGNOSIS — T8789 Other complications of amputation stump: Secondary | ICD-10-CM | POA: Diagnosis not present

## 2019-09-07 DIAGNOSIS — M868X7 Other osteomyelitis, ankle and foot: Secondary | ICD-10-CM | POA: Diagnosis not present

## 2019-09-07 DIAGNOSIS — I2510 Atherosclerotic heart disease of native coronary artery without angina pectoris: Secondary | ICD-10-CM | POA: Diagnosis not present

## 2019-09-07 DIAGNOSIS — Z792 Long term (current) use of antibiotics: Secondary | ICD-10-CM | POA: Diagnosis not present

## 2019-09-07 DIAGNOSIS — Z5181 Encounter for therapeutic drug level monitoring: Secondary | ICD-10-CM | POA: Diagnosis not present

## 2019-09-07 DIAGNOSIS — E1165 Type 2 diabetes mellitus with hyperglycemia: Secondary | ICD-10-CM | POA: Diagnosis not present

## 2019-09-07 DIAGNOSIS — E1151 Type 2 diabetes mellitus with diabetic peripheral angiopathy without gangrene: Secondary | ICD-10-CM | POA: Diagnosis not present

## 2019-09-07 DIAGNOSIS — N183 Chronic kidney disease, stage 3 unspecified: Secondary | ICD-10-CM | POA: Diagnosis not present

## 2019-09-07 DIAGNOSIS — M5116 Intervertebral disc disorders with radiculopathy, lumbar region: Secondary | ICD-10-CM | POA: Diagnosis not present

## 2019-09-07 DIAGNOSIS — Z794 Long term (current) use of insulin: Secondary | ICD-10-CM | POA: Diagnosis not present

## 2019-09-07 DIAGNOSIS — D631 Anemia in chronic kidney disease: Secondary | ICD-10-CM | POA: Diagnosis not present

## 2019-09-09 DIAGNOSIS — B9561 Methicillin susceptible Staphylococcus aureus infection as the cause of diseases classified elsewhere: Secondary | ICD-10-CM | POA: Diagnosis not present

## 2019-09-09 DIAGNOSIS — Z452 Encounter for adjustment and management of vascular access device: Secondary | ICD-10-CM | POA: Diagnosis not present

## 2019-09-09 DIAGNOSIS — Z792 Long term (current) use of antibiotics: Secondary | ICD-10-CM | POA: Diagnosis not present

## 2019-09-09 DIAGNOSIS — I129 Hypertensive chronic kidney disease with stage 1 through stage 4 chronic kidney disease, or unspecified chronic kidney disease: Secondary | ICD-10-CM | POA: Diagnosis not present

## 2019-09-09 DIAGNOSIS — T8789 Other complications of amputation stump: Secondary | ICD-10-CM | POA: Diagnosis not present

## 2019-09-09 DIAGNOSIS — E1122 Type 2 diabetes mellitus with diabetic chronic kidney disease: Secondary | ICD-10-CM | POA: Diagnosis not present

## 2019-09-09 DIAGNOSIS — G8929 Other chronic pain: Secondary | ICD-10-CM | POA: Diagnosis not present

## 2019-09-09 DIAGNOSIS — N183 Chronic kidney disease, stage 3 unspecified: Secondary | ICD-10-CM | POA: Diagnosis not present

## 2019-09-09 DIAGNOSIS — L03115 Cellulitis of right lower limb: Secondary | ICD-10-CM | POA: Diagnosis not present

## 2019-09-09 DIAGNOSIS — E1165 Type 2 diabetes mellitus with hyperglycemia: Secondary | ICD-10-CM | POA: Diagnosis not present

## 2019-09-09 DIAGNOSIS — M868X7 Other osteomyelitis, ankle and foot: Secondary | ICD-10-CM | POA: Diagnosis not present

## 2019-09-09 DIAGNOSIS — E1142 Type 2 diabetes mellitus with diabetic polyneuropathy: Secondary | ICD-10-CM | POA: Diagnosis not present

## 2019-09-09 DIAGNOSIS — Z794 Long term (current) use of insulin: Secondary | ICD-10-CM | POA: Diagnosis not present

## 2019-09-09 DIAGNOSIS — D631 Anemia in chronic kidney disease: Secondary | ICD-10-CM | POA: Diagnosis not present

## 2019-09-09 DIAGNOSIS — E1169 Type 2 diabetes mellitus with other specified complication: Secondary | ICD-10-CM | POA: Diagnosis not present

## 2019-09-09 DIAGNOSIS — M5116 Intervertebral disc disorders with radiculopathy, lumbar region: Secondary | ICD-10-CM | POA: Diagnosis not present

## 2019-09-09 DIAGNOSIS — I2510 Atherosclerotic heart disease of native coronary artery without angina pectoris: Secondary | ICD-10-CM | POA: Diagnosis not present

## 2019-09-09 DIAGNOSIS — I480 Paroxysmal atrial fibrillation: Secondary | ICD-10-CM | POA: Diagnosis not present

## 2019-09-09 DIAGNOSIS — E1151 Type 2 diabetes mellitus with diabetic peripheral angiopathy without gangrene: Secondary | ICD-10-CM | POA: Diagnosis not present

## 2019-09-09 DIAGNOSIS — B954 Other streptococcus as the cause of diseases classified elsewhere: Secondary | ICD-10-CM | POA: Diagnosis not present

## 2019-09-09 DIAGNOSIS — Z5181 Encounter for therapeutic drug level monitoring: Secondary | ICD-10-CM | POA: Diagnosis not present

## 2019-09-09 DIAGNOSIS — B9689 Other specified bacterial agents as the cause of diseases classified elsewhere: Secondary | ICD-10-CM | POA: Diagnosis not present

## 2019-09-12 DIAGNOSIS — L97415 Non-pressure chronic ulcer of right heel and midfoot with muscle involvement without evidence of necrosis: Secondary | ICD-10-CM | POA: Diagnosis not present

## 2019-09-12 DIAGNOSIS — E11621 Type 2 diabetes mellitus with foot ulcer: Secondary | ICD-10-CM | POA: Diagnosis not present

## 2019-09-14 DIAGNOSIS — Z5181 Encounter for therapeutic drug level monitoring: Secondary | ICD-10-CM | POA: Diagnosis not present

## 2019-09-14 DIAGNOSIS — E1142 Type 2 diabetes mellitus with diabetic polyneuropathy: Secondary | ICD-10-CM | POA: Diagnosis not present

## 2019-09-14 DIAGNOSIS — E1165 Type 2 diabetes mellitus with hyperglycemia: Secondary | ICD-10-CM | POA: Diagnosis not present

## 2019-09-14 DIAGNOSIS — B9689 Other specified bacterial agents as the cause of diseases classified elsewhere: Secondary | ICD-10-CM | POA: Diagnosis not present

## 2019-09-14 DIAGNOSIS — E1169 Type 2 diabetes mellitus with other specified complication: Secondary | ICD-10-CM | POA: Diagnosis not present

## 2019-09-14 DIAGNOSIS — G8929 Other chronic pain: Secondary | ICD-10-CM | POA: Diagnosis not present

## 2019-09-14 DIAGNOSIS — E1122 Type 2 diabetes mellitus with diabetic chronic kidney disease: Secondary | ICD-10-CM | POA: Diagnosis not present

## 2019-09-14 DIAGNOSIS — Z794 Long term (current) use of insulin: Secondary | ICD-10-CM | POA: Diagnosis not present

## 2019-09-14 DIAGNOSIS — D631 Anemia in chronic kidney disease: Secondary | ICD-10-CM | POA: Diagnosis not present

## 2019-09-14 DIAGNOSIS — I2510 Atherosclerotic heart disease of native coronary artery without angina pectoris: Secondary | ICD-10-CM | POA: Diagnosis not present

## 2019-09-14 DIAGNOSIS — E1151 Type 2 diabetes mellitus with diabetic peripheral angiopathy without gangrene: Secondary | ICD-10-CM | POA: Diagnosis not present

## 2019-09-14 DIAGNOSIS — L03115 Cellulitis of right lower limb: Secondary | ICD-10-CM | POA: Diagnosis not present

## 2019-09-14 DIAGNOSIS — N183 Chronic kidney disease, stage 3 unspecified: Secondary | ICD-10-CM | POA: Diagnosis not present

## 2019-09-14 DIAGNOSIS — M5116 Intervertebral disc disorders with radiculopathy, lumbar region: Secondary | ICD-10-CM | POA: Diagnosis not present

## 2019-09-14 DIAGNOSIS — I480 Paroxysmal atrial fibrillation: Secondary | ICD-10-CM | POA: Diagnosis not present

## 2019-09-14 DIAGNOSIS — B9561 Methicillin susceptible Staphylococcus aureus infection as the cause of diseases classified elsewhere: Secondary | ICD-10-CM | POA: Diagnosis not present

## 2019-09-14 DIAGNOSIS — B954 Other streptococcus as the cause of diseases classified elsewhere: Secondary | ICD-10-CM | POA: Diagnosis not present

## 2019-09-14 DIAGNOSIS — Z792 Long term (current) use of antibiotics: Secondary | ICD-10-CM | POA: Diagnosis not present

## 2019-09-14 DIAGNOSIS — I129 Hypertensive chronic kidney disease with stage 1 through stage 4 chronic kidney disease, or unspecified chronic kidney disease: Secondary | ICD-10-CM | POA: Diagnosis not present

## 2019-09-14 DIAGNOSIS — T8789 Other complications of amputation stump: Secondary | ICD-10-CM | POA: Diagnosis not present

## 2019-09-14 DIAGNOSIS — Z452 Encounter for adjustment and management of vascular access device: Secondary | ICD-10-CM | POA: Diagnosis not present

## 2019-09-14 DIAGNOSIS — M868X7 Other osteomyelitis, ankle and foot: Secondary | ICD-10-CM | POA: Diagnosis not present

## 2019-09-16 DIAGNOSIS — E1142 Type 2 diabetes mellitus with diabetic polyneuropathy: Secondary | ICD-10-CM | POA: Diagnosis not present

## 2019-09-16 DIAGNOSIS — Z452 Encounter for adjustment and management of vascular access device: Secondary | ICD-10-CM | POA: Diagnosis not present

## 2019-09-16 DIAGNOSIS — E1122 Type 2 diabetes mellitus with diabetic chronic kidney disease: Secondary | ICD-10-CM | POA: Diagnosis not present

## 2019-09-16 DIAGNOSIS — I2510 Atherosclerotic heart disease of native coronary artery without angina pectoris: Secondary | ICD-10-CM | POA: Diagnosis not present

## 2019-09-16 DIAGNOSIS — M868X7 Other osteomyelitis, ankle and foot: Secondary | ICD-10-CM | POA: Diagnosis not present

## 2019-09-16 DIAGNOSIS — I129 Hypertensive chronic kidney disease with stage 1 through stage 4 chronic kidney disease, or unspecified chronic kidney disease: Secondary | ICD-10-CM | POA: Diagnosis not present

## 2019-09-16 DIAGNOSIS — N183 Chronic kidney disease, stage 3 unspecified: Secondary | ICD-10-CM | POA: Diagnosis not present

## 2019-09-16 DIAGNOSIS — M5116 Intervertebral disc disorders with radiculopathy, lumbar region: Secondary | ICD-10-CM | POA: Diagnosis not present

## 2019-09-16 DIAGNOSIS — E1169 Type 2 diabetes mellitus with other specified complication: Secondary | ICD-10-CM | POA: Diagnosis not present

## 2019-09-16 DIAGNOSIS — B9689 Other specified bacterial agents as the cause of diseases classified elsewhere: Secondary | ICD-10-CM | POA: Diagnosis not present

## 2019-09-16 DIAGNOSIS — Z792 Long term (current) use of antibiotics: Secondary | ICD-10-CM | POA: Diagnosis not present

## 2019-09-16 DIAGNOSIS — B9561 Methicillin susceptible Staphylococcus aureus infection as the cause of diseases classified elsewhere: Secondary | ICD-10-CM | POA: Diagnosis not present

## 2019-09-16 DIAGNOSIS — B954 Other streptococcus as the cause of diseases classified elsewhere: Secondary | ICD-10-CM | POA: Diagnosis not present

## 2019-09-16 DIAGNOSIS — E1165 Type 2 diabetes mellitus with hyperglycemia: Secondary | ICD-10-CM | POA: Diagnosis not present

## 2019-09-16 DIAGNOSIS — D631 Anemia in chronic kidney disease: Secondary | ICD-10-CM | POA: Diagnosis not present

## 2019-09-16 DIAGNOSIS — Z794 Long term (current) use of insulin: Secondary | ICD-10-CM | POA: Diagnosis not present

## 2019-09-16 DIAGNOSIS — T8789 Other complications of amputation stump: Secondary | ICD-10-CM | POA: Diagnosis not present

## 2019-09-16 DIAGNOSIS — Z5181 Encounter for therapeutic drug level monitoring: Secondary | ICD-10-CM | POA: Diagnosis not present

## 2019-09-16 DIAGNOSIS — L03115 Cellulitis of right lower limb: Secondary | ICD-10-CM | POA: Diagnosis not present

## 2019-09-16 DIAGNOSIS — I480 Paroxysmal atrial fibrillation: Secondary | ICD-10-CM | POA: Diagnosis not present

## 2019-09-16 DIAGNOSIS — G8929 Other chronic pain: Secondary | ICD-10-CM | POA: Diagnosis not present

## 2019-09-16 DIAGNOSIS — E1151 Type 2 diabetes mellitus with diabetic peripheral angiopathy without gangrene: Secondary | ICD-10-CM | POA: Diagnosis not present

## 2019-09-19 DIAGNOSIS — G8929 Other chronic pain: Secondary | ICD-10-CM | POA: Diagnosis not present

## 2019-09-19 DIAGNOSIS — M5116 Intervertebral disc disorders with radiculopathy, lumbar region: Secondary | ICD-10-CM | POA: Diagnosis not present

## 2019-09-19 DIAGNOSIS — D631 Anemia in chronic kidney disease: Secondary | ICD-10-CM | POA: Diagnosis not present

## 2019-09-19 DIAGNOSIS — Z794 Long term (current) use of insulin: Secondary | ICD-10-CM | POA: Diagnosis not present

## 2019-09-19 DIAGNOSIS — B9689 Other specified bacterial agents as the cause of diseases classified elsewhere: Secondary | ICD-10-CM | POA: Diagnosis not present

## 2019-09-19 DIAGNOSIS — I129 Hypertensive chronic kidney disease with stage 1 through stage 4 chronic kidney disease, or unspecified chronic kidney disease: Secondary | ICD-10-CM | POA: Diagnosis not present

## 2019-09-19 DIAGNOSIS — Z452 Encounter for adjustment and management of vascular access device: Secondary | ICD-10-CM | POA: Diagnosis not present

## 2019-09-19 DIAGNOSIS — E1169 Type 2 diabetes mellitus with other specified complication: Secondary | ICD-10-CM | POA: Diagnosis not present

## 2019-09-19 DIAGNOSIS — I480 Paroxysmal atrial fibrillation: Secondary | ICD-10-CM | POA: Diagnosis not present

## 2019-09-19 DIAGNOSIS — Z792 Long term (current) use of antibiotics: Secondary | ICD-10-CM | POA: Diagnosis not present

## 2019-09-19 DIAGNOSIS — L03115 Cellulitis of right lower limb: Secondary | ICD-10-CM | POA: Diagnosis not present

## 2019-09-19 DIAGNOSIS — E1165 Type 2 diabetes mellitus with hyperglycemia: Secondary | ICD-10-CM | POA: Diagnosis not present

## 2019-09-19 DIAGNOSIS — N183 Chronic kidney disease, stage 3 unspecified: Secondary | ICD-10-CM | POA: Diagnosis not present

## 2019-09-19 DIAGNOSIS — E1151 Type 2 diabetes mellitus with diabetic peripheral angiopathy without gangrene: Secondary | ICD-10-CM | POA: Diagnosis not present

## 2019-09-19 DIAGNOSIS — T8789 Other complications of amputation stump: Secondary | ICD-10-CM | POA: Diagnosis not present

## 2019-09-19 DIAGNOSIS — Z5181 Encounter for therapeutic drug level monitoring: Secondary | ICD-10-CM | POA: Diagnosis not present

## 2019-09-19 DIAGNOSIS — M868X7 Other osteomyelitis, ankle and foot: Secondary | ICD-10-CM | POA: Diagnosis not present

## 2019-09-19 DIAGNOSIS — B9561 Methicillin susceptible Staphylococcus aureus infection as the cause of diseases classified elsewhere: Secondary | ICD-10-CM | POA: Diagnosis not present

## 2019-09-19 DIAGNOSIS — B954 Other streptococcus as the cause of diseases classified elsewhere: Secondary | ICD-10-CM | POA: Diagnosis not present

## 2019-09-19 DIAGNOSIS — I2510 Atherosclerotic heart disease of native coronary artery without angina pectoris: Secondary | ICD-10-CM | POA: Diagnosis not present

## 2019-09-19 DIAGNOSIS — E1142 Type 2 diabetes mellitus with diabetic polyneuropathy: Secondary | ICD-10-CM | POA: Diagnosis not present

## 2019-09-19 DIAGNOSIS — E1122 Type 2 diabetes mellitus with diabetic chronic kidney disease: Secondary | ICD-10-CM | POA: Diagnosis not present

## 2019-09-21 DIAGNOSIS — Z452 Encounter for adjustment and management of vascular access device: Secondary | ICD-10-CM | POA: Diagnosis not present

## 2019-09-21 DIAGNOSIS — E1151 Type 2 diabetes mellitus with diabetic peripheral angiopathy without gangrene: Secondary | ICD-10-CM | POA: Diagnosis not present

## 2019-09-21 DIAGNOSIS — M5116 Intervertebral disc disorders with radiculopathy, lumbar region: Secondary | ICD-10-CM | POA: Diagnosis not present

## 2019-09-21 DIAGNOSIS — N183 Chronic kidney disease, stage 3 unspecified: Secondary | ICD-10-CM | POA: Diagnosis not present

## 2019-09-21 DIAGNOSIS — D631 Anemia in chronic kidney disease: Secondary | ICD-10-CM | POA: Diagnosis not present

## 2019-09-21 DIAGNOSIS — E1165 Type 2 diabetes mellitus with hyperglycemia: Secondary | ICD-10-CM | POA: Diagnosis not present

## 2019-09-21 DIAGNOSIS — I129 Hypertensive chronic kidney disease with stage 1 through stage 4 chronic kidney disease, or unspecified chronic kidney disease: Secondary | ICD-10-CM | POA: Diagnosis not present

## 2019-09-21 DIAGNOSIS — L03115 Cellulitis of right lower limb: Secondary | ICD-10-CM | POA: Diagnosis not present

## 2019-09-21 DIAGNOSIS — E785 Hyperlipidemia, unspecified: Secondary | ICD-10-CM | POA: Diagnosis not present

## 2019-09-21 DIAGNOSIS — Z792 Long term (current) use of antibiotics: Secondary | ICD-10-CM | POA: Diagnosis not present

## 2019-09-21 DIAGNOSIS — G8929 Other chronic pain: Secondary | ICD-10-CM | POA: Diagnosis not present

## 2019-09-21 DIAGNOSIS — I480 Paroxysmal atrial fibrillation: Secondary | ICD-10-CM | POA: Diagnosis not present

## 2019-09-21 DIAGNOSIS — T8789 Other complications of amputation stump: Secondary | ICD-10-CM | POA: Diagnosis not present

## 2019-09-21 DIAGNOSIS — M868X7 Other osteomyelitis, ankle and foot: Secondary | ICD-10-CM | POA: Diagnosis not present

## 2019-09-21 DIAGNOSIS — B9689 Other specified bacterial agents as the cause of diseases classified elsewhere: Secondary | ICD-10-CM | POA: Diagnosis not present

## 2019-09-21 DIAGNOSIS — I10 Essential (primary) hypertension: Secondary | ICD-10-CM | POA: Diagnosis not present

## 2019-09-21 DIAGNOSIS — B954 Other streptococcus as the cause of diseases classified elsewhere: Secondary | ICD-10-CM | POA: Diagnosis not present

## 2019-09-21 DIAGNOSIS — Z5181 Encounter for therapeutic drug level monitoring: Secondary | ICD-10-CM | POA: Diagnosis not present

## 2019-09-21 DIAGNOSIS — E1169 Type 2 diabetes mellitus with other specified complication: Secondary | ICD-10-CM | POA: Diagnosis not present

## 2019-09-21 DIAGNOSIS — Z794 Long term (current) use of insulin: Secondary | ICD-10-CM | POA: Diagnosis not present

## 2019-09-21 DIAGNOSIS — E1142 Type 2 diabetes mellitus with diabetic polyneuropathy: Secondary | ICD-10-CM | POA: Diagnosis not present

## 2019-09-21 DIAGNOSIS — I2510 Atherosclerotic heart disease of native coronary artery without angina pectoris: Secondary | ICD-10-CM | POA: Diagnosis not present

## 2019-09-21 DIAGNOSIS — B9561 Methicillin susceptible Staphylococcus aureus infection as the cause of diseases classified elsewhere: Secondary | ICD-10-CM | POA: Diagnosis not present

## 2019-09-21 DIAGNOSIS — E1122 Type 2 diabetes mellitus with diabetic chronic kidney disease: Secondary | ICD-10-CM | POA: Diagnosis not present

## 2019-09-25 DIAGNOSIS — R2689 Other abnormalities of gait and mobility: Secondary | ICD-10-CM | POA: Diagnosis not present

## 2019-09-25 DIAGNOSIS — L03115 Cellulitis of right lower limb: Secondary | ICD-10-CM | POA: Diagnosis not present

## 2019-09-25 DIAGNOSIS — Z89422 Acquired absence of other left toe(s): Secondary | ICD-10-CM | POA: Diagnosis not present

## 2019-09-27 DIAGNOSIS — G8929 Other chronic pain: Secondary | ICD-10-CM | POA: Diagnosis not present

## 2019-09-27 DIAGNOSIS — E1165 Type 2 diabetes mellitus with hyperglycemia: Secondary | ICD-10-CM | POA: Diagnosis not present

## 2019-09-27 DIAGNOSIS — I129 Hypertensive chronic kidney disease with stage 1 through stage 4 chronic kidney disease, or unspecified chronic kidney disease: Secondary | ICD-10-CM | POA: Diagnosis not present

## 2019-09-27 DIAGNOSIS — E1122 Type 2 diabetes mellitus with diabetic chronic kidney disease: Secondary | ICD-10-CM | POA: Diagnosis not present

## 2019-09-27 DIAGNOSIS — N183 Chronic kidney disease, stage 3 unspecified: Secondary | ICD-10-CM | POA: Diagnosis not present

## 2019-09-27 DIAGNOSIS — I480 Paroxysmal atrial fibrillation: Secondary | ICD-10-CM | POA: Diagnosis not present

## 2019-09-27 DIAGNOSIS — E1151 Type 2 diabetes mellitus with diabetic peripheral angiopathy without gangrene: Secondary | ICD-10-CM | POA: Diagnosis not present

## 2019-09-27 DIAGNOSIS — E1142 Type 2 diabetes mellitus with diabetic polyneuropathy: Secondary | ICD-10-CM | POA: Diagnosis not present

## 2019-09-27 DIAGNOSIS — I2510 Atherosclerotic heart disease of native coronary artery without angina pectoris: Secondary | ICD-10-CM | POA: Diagnosis not present

## 2019-09-27 DIAGNOSIS — M5116 Intervertebral disc disorders with radiculopathy, lumbar region: Secondary | ICD-10-CM | POA: Diagnosis not present

## 2019-09-27 DIAGNOSIS — Z794 Long term (current) use of insulin: Secondary | ICD-10-CM | POA: Diagnosis not present

## 2019-09-27 DIAGNOSIS — T8789 Other complications of amputation stump: Secondary | ICD-10-CM | POA: Diagnosis not present

## 2019-09-27 DIAGNOSIS — D631 Anemia in chronic kidney disease: Secondary | ICD-10-CM | POA: Diagnosis not present

## 2019-10-06 DIAGNOSIS — G8929 Other chronic pain: Secondary | ICD-10-CM | POA: Diagnosis not present

## 2019-10-06 DIAGNOSIS — N183 Chronic kidney disease, stage 3 unspecified: Secondary | ICD-10-CM | POA: Diagnosis not present

## 2019-10-06 DIAGNOSIS — E1142 Type 2 diabetes mellitus with diabetic polyneuropathy: Secondary | ICD-10-CM | POA: Diagnosis not present

## 2019-10-06 DIAGNOSIS — E1151 Type 2 diabetes mellitus with diabetic peripheral angiopathy without gangrene: Secondary | ICD-10-CM | POA: Diagnosis not present

## 2019-10-06 DIAGNOSIS — D631 Anemia in chronic kidney disease: Secondary | ICD-10-CM | POA: Diagnosis not present

## 2019-10-06 DIAGNOSIS — Z794 Long term (current) use of insulin: Secondary | ICD-10-CM | POA: Diagnosis not present

## 2019-10-06 DIAGNOSIS — I129 Hypertensive chronic kidney disease with stage 1 through stage 4 chronic kidney disease, or unspecified chronic kidney disease: Secondary | ICD-10-CM | POA: Diagnosis not present

## 2019-10-06 DIAGNOSIS — I480 Paroxysmal atrial fibrillation: Secondary | ICD-10-CM | POA: Diagnosis not present

## 2019-10-06 DIAGNOSIS — E1165 Type 2 diabetes mellitus with hyperglycemia: Secondary | ICD-10-CM | POA: Diagnosis not present

## 2019-10-06 DIAGNOSIS — M5116 Intervertebral disc disorders with radiculopathy, lumbar region: Secondary | ICD-10-CM | POA: Diagnosis not present

## 2019-10-06 DIAGNOSIS — T8789 Other complications of amputation stump: Secondary | ICD-10-CM | POA: Diagnosis not present

## 2019-10-06 DIAGNOSIS — E1122 Type 2 diabetes mellitus with diabetic chronic kidney disease: Secondary | ICD-10-CM | POA: Diagnosis not present

## 2019-10-06 DIAGNOSIS — I2510 Atherosclerotic heart disease of native coronary artery without angina pectoris: Secondary | ICD-10-CM | POA: Diagnosis not present

## 2019-10-10 DIAGNOSIS — E11621 Type 2 diabetes mellitus with foot ulcer: Secondary | ICD-10-CM | POA: Diagnosis not present

## 2019-10-10 DIAGNOSIS — L97415 Non-pressure chronic ulcer of right heel and midfoot with muscle involvement without evidence of necrosis: Secondary | ICD-10-CM | POA: Diagnosis not present

## 2019-10-12 DIAGNOSIS — M5116 Intervertebral disc disorders with radiculopathy, lumbar region: Secondary | ICD-10-CM | POA: Diagnosis not present

## 2019-10-12 DIAGNOSIS — Z794 Long term (current) use of insulin: Secondary | ICD-10-CM | POA: Diagnosis not present

## 2019-10-12 DIAGNOSIS — T8789 Other complications of amputation stump: Secondary | ICD-10-CM | POA: Diagnosis not present

## 2019-10-12 DIAGNOSIS — E1151 Type 2 diabetes mellitus with diabetic peripheral angiopathy without gangrene: Secondary | ICD-10-CM | POA: Diagnosis not present

## 2019-10-12 DIAGNOSIS — I2510 Atherosclerotic heart disease of native coronary artery without angina pectoris: Secondary | ICD-10-CM | POA: Diagnosis not present

## 2019-10-12 DIAGNOSIS — G8929 Other chronic pain: Secondary | ICD-10-CM | POA: Diagnosis not present

## 2019-10-12 DIAGNOSIS — D631 Anemia in chronic kidney disease: Secondary | ICD-10-CM | POA: Diagnosis not present

## 2019-10-12 DIAGNOSIS — E1142 Type 2 diabetes mellitus with diabetic polyneuropathy: Secondary | ICD-10-CM | POA: Diagnosis not present

## 2019-10-12 DIAGNOSIS — I480 Paroxysmal atrial fibrillation: Secondary | ICD-10-CM | POA: Diagnosis not present

## 2019-10-12 DIAGNOSIS — N183 Chronic kidney disease, stage 3 unspecified: Secondary | ICD-10-CM | POA: Diagnosis not present

## 2019-10-12 DIAGNOSIS — E1122 Type 2 diabetes mellitus with diabetic chronic kidney disease: Secondary | ICD-10-CM | POA: Diagnosis not present

## 2019-10-12 DIAGNOSIS — I129 Hypertensive chronic kidney disease with stage 1 through stage 4 chronic kidney disease, or unspecified chronic kidney disease: Secondary | ICD-10-CM | POA: Diagnosis not present

## 2019-10-12 DIAGNOSIS — E1165 Type 2 diabetes mellitus with hyperglycemia: Secondary | ICD-10-CM | POA: Diagnosis not present

## 2019-10-13 DIAGNOSIS — E118 Type 2 diabetes mellitus with unspecified complications: Secondary | ICD-10-CM | POA: Diagnosis not present

## 2019-10-13 DIAGNOSIS — M25512 Pain in left shoulder: Secondary | ICD-10-CM | POA: Diagnosis not present

## 2019-10-13 DIAGNOSIS — I2699 Other pulmonary embolism without acute cor pulmonale: Secondary | ICD-10-CM | POA: Diagnosis not present

## 2019-10-13 DIAGNOSIS — M25561 Pain in right knee: Secondary | ICD-10-CM | POA: Diagnosis not present

## 2019-10-19 DIAGNOSIS — E1165 Type 2 diabetes mellitus with hyperglycemia: Secondary | ICD-10-CM | POA: Diagnosis not present

## 2019-10-19 DIAGNOSIS — T8789 Other complications of amputation stump: Secondary | ICD-10-CM | POA: Diagnosis not present

## 2019-10-19 DIAGNOSIS — I129 Hypertensive chronic kidney disease with stage 1 through stage 4 chronic kidney disease, or unspecified chronic kidney disease: Secondary | ICD-10-CM | POA: Diagnosis not present

## 2019-10-19 DIAGNOSIS — Z794 Long term (current) use of insulin: Secondary | ICD-10-CM | POA: Diagnosis not present

## 2019-10-19 DIAGNOSIS — E1151 Type 2 diabetes mellitus with diabetic peripheral angiopathy without gangrene: Secondary | ICD-10-CM | POA: Diagnosis not present

## 2019-10-19 DIAGNOSIS — M5116 Intervertebral disc disorders with radiculopathy, lumbar region: Secondary | ICD-10-CM | POA: Diagnosis not present

## 2019-10-19 DIAGNOSIS — N183 Chronic kidney disease, stage 3 unspecified: Secondary | ICD-10-CM | POA: Diagnosis not present

## 2019-10-19 DIAGNOSIS — D631 Anemia in chronic kidney disease: Secondary | ICD-10-CM | POA: Diagnosis not present

## 2019-10-19 DIAGNOSIS — I2510 Atherosclerotic heart disease of native coronary artery without angina pectoris: Secondary | ICD-10-CM | POA: Diagnosis not present

## 2019-10-19 DIAGNOSIS — I480 Paroxysmal atrial fibrillation: Secondary | ICD-10-CM | POA: Diagnosis not present

## 2019-10-19 DIAGNOSIS — G8929 Other chronic pain: Secondary | ICD-10-CM | POA: Diagnosis not present

## 2019-10-19 DIAGNOSIS — E1122 Type 2 diabetes mellitus with diabetic chronic kidney disease: Secondary | ICD-10-CM | POA: Diagnosis not present

## 2019-10-19 DIAGNOSIS — E1142 Type 2 diabetes mellitus with diabetic polyneuropathy: Secondary | ICD-10-CM | POA: Diagnosis not present

## 2019-10-21 DIAGNOSIS — I10 Essential (primary) hypertension: Secondary | ICD-10-CM | POA: Diagnosis not present

## 2019-10-21 DIAGNOSIS — E785 Hyperlipidemia, unspecified: Secondary | ICD-10-CM | POA: Diagnosis not present

## 2019-10-24 DIAGNOSIS — G8929 Other chronic pain: Secondary | ICD-10-CM | POA: Diagnosis not present

## 2019-10-24 DIAGNOSIS — M5116 Intervertebral disc disorders with radiculopathy, lumbar region: Secondary | ICD-10-CM | POA: Diagnosis not present

## 2019-10-24 DIAGNOSIS — I2510 Atherosclerotic heart disease of native coronary artery without angina pectoris: Secondary | ICD-10-CM | POA: Diagnosis not present

## 2019-10-24 DIAGNOSIS — Z794 Long term (current) use of insulin: Secondary | ICD-10-CM | POA: Diagnosis not present

## 2019-10-24 DIAGNOSIS — N183 Chronic kidney disease, stage 3 unspecified: Secondary | ICD-10-CM | POA: Diagnosis not present

## 2019-10-24 DIAGNOSIS — E1122 Type 2 diabetes mellitus with diabetic chronic kidney disease: Secondary | ICD-10-CM | POA: Diagnosis not present

## 2019-10-24 DIAGNOSIS — D631 Anemia in chronic kidney disease: Secondary | ICD-10-CM | POA: Diagnosis not present

## 2019-10-24 DIAGNOSIS — E1151 Type 2 diabetes mellitus with diabetic peripheral angiopathy without gangrene: Secondary | ICD-10-CM | POA: Diagnosis not present

## 2019-10-24 DIAGNOSIS — I480 Paroxysmal atrial fibrillation: Secondary | ICD-10-CM | POA: Diagnosis not present

## 2019-10-24 DIAGNOSIS — T8789 Other complications of amputation stump: Secondary | ICD-10-CM | POA: Diagnosis not present

## 2019-10-24 DIAGNOSIS — E1142 Type 2 diabetes mellitus with diabetic polyneuropathy: Secondary | ICD-10-CM | POA: Diagnosis not present

## 2019-10-24 DIAGNOSIS — I129 Hypertensive chronic kidney disease with stage 1 through stage 4 chronic kidney disease, or unspecified chronic kidney disease: Secondary | ICD-10-CM | POA: Diagnosis not present

## 2019-10-24 DIAGNOSIS — E1165 Type 2 diabetes mellitus with hyperglycemia: Secondary | ICD-10-CM | POA: Diagnosis not present

## 2019-10-25 DIAGNOSIS — Z89422 Acquired absence of other left toe(s): Secondary | ICD-10-CM | POA: Diagnosis not present

## 2019-10-25 DIAGNOSIS — L03115 Cellulitis of right lower limb: Secondary | ICD-10-CM | POA: Diagnosis not present

## 2019-10-25 DIAGNOSIS — R2689 Other abnormalities of gait and mobility: Secondary | ICD-10-CM | POA: Diagnosis not present

## 2019-11-03 DIAGNOSIS — Z794 Long term (current) use of insulin: Secondary | ICD-10-CM | POA: Diagnosis not present

## 2019-11-03 DIAGNOSIS — G8929 Other chronic pain: Secondary | ICD-10-CM | POA: Diagnosis not present

## 2019-11-03 DIAGNOSIS — M5116 Intervertebral disc disorders with radiculopathy, lumbar region: Secondary | ICD-10-CM | POA: Diagnosis not present

## 2019-11-03 DIAGNOSIS — I480 Paroxysmal atrial fibrillation: Secondary | ICD-10-CM | POA: Diagnosis not present

## 2019-11-03 DIAGNOSIS — N183 Chronic kidney disease, stage 3 unspecified: Secondary | ICD-10-CM | POA: Diagnosis not present

## 2019-11-03 DIAGNOSIS — E1151 Type 2 diabetes mellitus with diabetic peripheral angiopathy without gangrene: Secondary | ICD-10-CM | POA: Diagnosis not present

## 2019-11-03 DIAGNOSIS — I129 Hypertensive chronic kidney disease with stage 1 through stage 4 chronic kidney disease, or unspecified chronic kidney disease: Secondary | ICD-10-CM | POA: Diagnosis not present

## 2019-11-03 DIAGNOSIS — E1165 Type 2 diabetes mellitus with hyperglycemia: Secondary | ICD-10-CM | POA: Diagnosis not present

## 2019-11-03 DIAGNOSIS — D631 Anemia in chronic kidney disease: Secondary | ICD-10-CM | POA: Diagnosis not present

## 2019-11-03 DIAGNOSIS — E1122 Type 2 diabetes mellitus with diabetic chronic kidney disease: Secondary | ICD-10-CM | POA: Diagnosis not present

## 2019-11-03 DIAGNOSIS — T8789 Other complications of amputation stump: Secondary | ICD-10-CM | POA: Diagnosis not present

## 2019-11-03 DIAGNOSIS — E1142 Type 2 diabetes mellitus with diabetic polyneuropathy: Secondary | ICD-10-CM | POA: Diagnosis not present

## 2019-11-03 DIAGNOSIS — I2510 Atherosclerotic heart disease of native coronary artery without angina pectoris: Secondary | ICD-10-CM | POA: Diagnosis not present

## 2019-11-10 DIAGNOSIS — E1122 Type 2 diabetes mellitus with diabetic chronic kidney disease: Secondary | ICD-10-CM | POA: Diagnosis not present

## 2019-11-10 DIAGNOSIS — T8789 Other complications of amputation stump: Secondary | ICD-10-CM | POA: Diagnosis not present

## 2019-11-10 DIAGNOSIS — D631 Anemia in chronic kidney disease: Secondary | ICD-10-CM | POA: Diagnosis not present

## 2019-11-10 DIAGNOSIS — Z794 Long term (current) use of insulin: Secondary | ICD-10-CM | POA: Diagnosis not present

## 2019-11-10 DIAGNOSIS — I2510 Atherosclerotic heart disease of native coronary artery without angina pectoris: Secondary | ICD-10-CM | POA: Diagnosis not present

## 2019-11-10 DIAGNOSIS — I480 Paroxysmal atrial fibrillation: Secondary | ICD-10-CM | POA: Diagnosis not present

## 2019-11-10 DIAGNOSIS — G8929 Other chronic pain: Secondary | ICD-10-CM | POA: Diagnosis not present

## 2019-11-10 DIAGNOSIS — E1142 Type 2 diabetes mellitus with diabetic polyneuropathy: Secondary | ICD-10-CM | POA: Diagnosis not present

## 2019-11-10 DIAGNOSIS — M5116 Intervertebral disc disorders with radiculopathy, lumbar region: Secondary | ICD-10-CM | POA: Diagnosis not present

## 2019-11-10 DIAGNOSIS — N183 Chronic kidney disease, stage 3 unspecified: Secondary | ICD-10-CM | POA: Diagnosis not present

## 2019-11-10 DIAGNOSIS — E1165 Type 2 diabetes mellitus with hyperglycemia: Secondary | ICD-10-CM | POA: Diagnosis not present

## 2019-11-10 DIAGNOSIS — I129 Hypertensive chronic kidney disease with stage 1 through stage 4 chronic kidney disease, or unspecified chronic kidney disease: Secondary | ICD-10-CM | POA: Diagnosis not present

## 2019-11-10 DIAGNOSIS — E1151 Type 2 diabetes mellitus with diabetic peripheral angiopathy without gangrene: Secondary | ICD-10-CM | POA: Diagnosis not present

## 2019-11-17 DIAGNOSIS — D631 Anemia in chronic kidney disease: Secondary | ICD-10-CM | POA: Diagnosis not present

## 2019-11-17 DIAGNOSIS — N183 Chronic kidney disease, stage 3 unspecified: Secondary | ICD-10-CM | POA: Diagnosis not present

## 2019-11-17 DIAGNOSIS — I129 Hypertensive chronic kidney disease with stage 1 through stage 4 chronic kidney disease, or unspecified chronic kidney disease: Secondary | ICD-10-CM | POA: Diagnosis not present

## 2019-11-17 DIAGNOSIS — E1151 Type 2 diabetes mellitus with diabetic peripheral angiopathy without gangrene: Secondary | ICD-10-CM | POA: Diagnosis not present

## 2019-11-17 DIAGNOSIS — Z794 Long term (current) use of insulin: Secondary | ICD-10-CM | POA: Diagnosis not present

## 2019-11-17 DIAGNOSIS — E1122 Type 2 diabetes mellitus with diabetic chronic kidney disease: Secondary | ICD-10-CM | POA: Diagnosis not present

## 2019-11-17 DIAGNOSIS — I2510 Atherosclerotic heart disease of native coronary artery without angina pectoris: Secondary | ICD-10-CM | POA: Diagnosis not present

## 2019-11-17 DIAGNOSIS — I480 Paroxysmal atrial fibrillation: Secondary | ICD-10-CM | POA: Diagnosis not present

## 2019-11-17 DIAGNOSIS — T8789 Other complications of amputation stump: Secondary | ICD-10-CM | POA: Diagnosis not present

## 2019-11-17 DIAGNOSIS — E1165 Type 2 diabetes mellitus with hyperglycemia: Secondary | ICD-10-CM | POA: Diagnosis not present

## 2019-11-17 DIAGNOSIS — E1142 Type 2 diabetes mellitus with diabetic polyneuropathy: Secondary | ICD-10-CM | POA: Diagnosis not present

## 2019-11-17 DIAGNOSIS — M5116 Intervertebral disc disorders with radiculopathy, lumbar region: Secondary | ICD-10-CM | POA: Diagnosis not present

## 2019-11-17 DIAGNOSIS — G8929 Other chronic pain: Secondary | ICD-10-CM | POA: Diagnosis not present

## 2019-11-18 DIAGNOSIS — Z89422 Acquired absence of other left toe(s): Secondary | ICD-10-CM | POA: Diagnosis not present

## 2019-11-18 DIAGNOSIS — L97422 Non-pressure chronic ulcer of left heel and midfoot with fat layer exposed: Secondary | ICD-10-CM | POA: Diagnosis not present

## 2019-11-18 DIAGNOSIS — Z89412 Acquired absence of left great toe: Secondary | ICD-10-CM | POA: Diagnosis not present

## 2019-11-18 DIAGNOSIS — E11621 Type 2 diabetes mellitus with foot ulcer: Secondary | ICD-10-CM | POA: Diagnosis not present

## 2019-11-18 DIAGNOSIS — E1142 Type 2 diabetes mellitus with diabetic polyneuropathy: Secondary | ICD-10-CM | POA: Diagnosis not present

## 2019-11-23 DIAGNOSIS — E1165 Type 2 diabetes mellitus with hyperglycemia: Secondary | ICD-10-CM | POA: Diagnosis not present

## 2019-11-23 DIAGNOSIS — M5116 Intervertebral disc disorders with radiculopathy, lumbar region: Secondary | ICD-10-CM | POA: Diagnosis not present

## 2019-11-23 DIAGNOSIS — N183 Chronic kidney disease, stage 3 unspecified: Secondary | ICD-10-CM | POA: Diagnosis not present

## 2019-11-23 DIAGNOSIS — G8929 Other chronic pain: Secondary | ICD-10-CM | POA: Diagnosis not present

## 2019-11-23 DIAGNOSIS — Z794 Long term (current) use of insulin: Secondary | ICD-10-CM | POA: Diagnosis not present

## 2019-11-23 DIAGNOSIS — T8789 Other complications of amputation stump: Secondary | ICD-10-CM | POA: Diagnosis not present

## 2019-11-23 DIAGNOSIS — D631 Anemia in chronic kidney disease: Secondary | ICD-10-CM | POA: Diagnosis not present

## 2019-11-23 DIAGNOSIS — I2510 Atherosclerotic heart disease of native coronary artery without angina pectoris: Secondary | ICD-10-CM | POA: Diagnosis not present

## 2019-11-23 DIAGNOSIS — I480 Paroxysmal atrial fibrillation: Secondary | ICD-10-CM | POA: Diagnosis not present

## 2019-11-23 DIAGNOSIS — E1122 Type 2 diabetes mellitus with diabetic chronic kidney disease: Secondary | ICD-10-CM | POA: Diagnosis not present

## 2019-11-23 DIAGNOSIS — E1142 Type 2 diabetes mellitus with diabetic polyneuropathy: Secondary | ICD-10-CM | POA: Diagnosis not present

## 2019-11-23 DIAGNOSIS — I129 Hypertensive chronic kidney disease with stage 1 through stage 4 chronic kidney disease, or unspecified chronic kidney disease: Secondary | ICD-10-CM | POA: Diagnosis not present

## 2019-11-23 DIAGNOSIS — E1151 Type 2 diabetes mellitus with diabetic peripheral angiopathy without gangrene: Secondary | ICD-10-CM | POA: Diagnosis not present

## 2019-11-25 DIAGNOSIS — L03115 Cellulitis of right lower limb: Secondary | ICD-10-CM | POA: Diagnosis not present

## 2019-11-25 DIAGNOSIS — Z89422 Acquired absence of other left toe(s): Secondary | ICD-10-CM | POA: Diagnosis not present

## 2019-11-25 DIAGNOSIS — R2689 Other abnormalities of gait and mobility: Secondary | ICD-10-CM | POA: Diagnosis not present

## 2019-11-28 DIAGNOSIS — I2510 Atherosclerotic heart disease of native coronary artery without angina pectoris: Secondary | ICD-10-CM | POA: Diagnosis not present

## 2019-11-28 DIAGNOSIS — E1122 Type 2 diabetes mellitus with diabetic chronic kidney disease: Secondary | ICD-10-CM | POA: Diagnosis not present

## 2019-11-28 DIAGNOSIS — N183 Chronic kidney disease, stage 3 unspecified: Secondary | ICD-10-CM | POA: Diagnosis not present

## 2019-11-28 DIAGNOSIS — L97422 Non-pressure chronic ulcer of left heel and midfoot with fat layer exposed: Secondary | ICD-10-CM | POA: Diagnosis not present

## 2019-11-28 DIAGNOSIS — G8929 Other chronic pain: Secondary | ICD-10-CM | POA: Diagnosis not present

## 2019-11-28 DIAGNOSIS — Z89431 Acquired absence of right foot: Secondary | ICD-10-CM | POA: Diagnosis not present

## 2019-11-28 DIAGNOSIS — I480 Paroxysmal atrial fibrillation: Secondary | ICD-10-CM | POA: Diagnosis not present

## 2019-11-28 DIAGNOSIS — Z794 Long term (current) use of insulin: Secondary | ICD-10-CM | POA: Diagnosis not present

## 2019-11-28 DIAGNOSIS — M5116 Intervertebral disc disorders with radiculopathy, lumbar region: Secondary | ICD-10-CM | POA: Diagnosis not present

## 2019-11-28 DIAGNOSIS — E1151 Type 2 diabetes mellitus with diabetic peripheral angiopathy without gangrene: Secondary | ICD-10-CM | POA: Diagnosis not present

## 2019-11-28 DIAGNOSIS — D631 Anemia in chronic kidney disease: Secondary | ICD-10-CM | POA: Diagnosis not present

## 2019-11-28 DIAGNOSIS — E1165 Type 2 diabetes mellitus with hyperglycemia: Secondary | ICD-10-CM | POA: Diagnosis not present

## 2019-11-28 DIAGNOSIS — I129 Hypertensive chronic kidney disease with stage 1 through stage 4 chronic kidney disease, or unspecified chronic kidney disease: Secondary | ICD-10-CM | POA: Diagnosis not present

## 2019-11-28 DIAGNOSIS — E11621 Type 2 diabetes mellitus with foot ulcer: Secondary | ICD-10-CM | POA: Diagnosis not present

## 2019-11-28 DIAGNOSIS — E1142 Type 2 diabetes mellitus with diabetic polyneuropathy: Secondary | ICD-10-CM | POA: Diagnosis not present

## 2019-12-07 DIAGNOSIS — M5116 Intervertebral disc disorders with radiculopathy, lumbar region: Secondary | ICD-10-CM | POA: Diagnosis not present

## 2019-12-07 DIAGNOSIS — E11621 Type 2 diabetes mellitus with foot ulcer: Secondary | ICD-10-CM | POA: Diagnosis not present

## 2019-12-07 DIAGNOSIS — E1142 Type 2 diabetes mellitus with diabetic polyneuropathy: Secondary | ICD-10-CM | POA: Diagnosis not present

## 2019-12-07 DIAGNOSIS — D631 Anemia in chronic kidney disease: Secondary | ICD-10-CM | POA: Diagnosis not present

## 2019-12-07 DIAGNOSIS — N183 Chronic kidney disease, stage 3 unspecified: Secondary | ICD-10-CM | POA: Diagnosis not present

## 2019-12-07 DIAGNOSIS — I2510 Atherosclerotic heart disease of native coronary artery without angina pectoris: Secondary | ICD-10-CM | POA: Diagnosis not present

## 2019-12-07 DIAGNOSIS — L97422 Non-pressure chronic ulcer of left heel and midfoot with fat layer exposed: Secondary | ICD-10-CM | POA: Diagnosis not present

## 2019-12-07 DIAGNOSIS — E1122 Type 2 diabetes mellitus with diabetic chronic kidney disease: Secondary | ICD-10-CM | POA: Diagnosis not present

## 2019-12-07 DIAGNOSIS — Z794 Long term (current) use of insulin: Secondary | ICD-10-CM | POA: Diagnosis not present

## 2019-12-07 DIAGNOSIS — G8929 Other chronic pain: Secondary | ICD-10-CM | POA: Diagnosis not present

## 2019-12-07 DIAGNOSIS — E1165 Type 2 diabetes mellitus with hyperglycemia: Secondary | ICD-10-CM | POA: Diagnosis not present

## 2019-12-07 DIAGNOSIS — I129 Hypertensive chronic kidney disease with stage 1 through stage 4 chronic kidney disease, or unspecified chronic kidney disease: Secondary | ICD-10-CM | POA: Diagnosis not present

## 2019-12-07 DIAGNOSIS — Z89431 Acquired absence of right foot: Secondary | ICD-10-CM | POA: Diagnosis not present

## 2019-12-07 DIAGNOSIS — E1151 Type 2 diabetes mellitus with diabetic peripheral angiopathy without gangrene: Secondary | ICD-10-CM | POA: Diagnosis not present

## 2019-12-07 DIAGNOSIS — I480 Paroxysmal atrial fibrillation: Secondary | ICD-10-CM | POA: Diagnosis not present

## 2019-12-14 DIAGNOSIS — I2699 Other pulmonary embolism without acute cor pulmonale: Secondary | ICD-10-CM | POA: Diagnosis not present

## 2019-12-14 DIAGNOSIS — Z89431 Acquired absence of right foot: Secondary | ICD-10-CM | POA: Diagnosis not present

## 2019-12-14 DIAGNOSIS — Z89412 Acquired absence of left great toe: Secondary | ICD-10-CM | POA: Diagnosis not present

## 2019-12-14 DIAGNOSIS — Z89422 Acquired absence of other left toe(s): Secondary | ICD-10-CM | POA: Diagnosis not present

## 2019-12-14 DIAGNOSIS — M25562 Pain in left knee: Secondary | ICD-10-CM | POA: Diagnosis not present

## 2019-12-14 DIAGNOSIS — E1142 Type 2 diabetes mellitus with diabetic polyneuropathy: Secondary | ICD-10-CM | POA: Diagnosis not present

## 2019-12-14 DIAGNOSIS — M549 Dorsalgia, unspecified: Secondary | ICD-10-CM | POA: Diagnosis not present

## 2019-12-15 DIAGNOSIS — E1122 Type 2 diabetes mellitus with diabetic chronic kidney disease: Secondary | ICD-10-CM | POA: Diagnosis not present

## 2019-12-15 DIAGNOSIS — Z794 Long term (current) use of insulin: Secondary | ICD-10-CM | POA: Diagnosis not present

## 2019-12-15 DIAGNOSIS — M5116 Intervertebral disc disorders with radiculopathy, lumbar region: Secondary | ICD-10-CM | POA: Diagnosis not present

## 2019-12-15 DIAGNOSIS — E1142 Type 2 diabetes mellitus with diabetic polyneuropathy: Secondary | ICD-10-CM | POA: Diagnosis not present

## 2019-12-15 DIAGNOSIS — D631 Anemia in chronic kidney disease: Secondary | ICD-10-CM | POA: Diagnosis not present

## 2019-12-15 DIAGNOSIS — L97422 Non-pressure chronic ulcer of left heel and midfoot with fat layer exposed: Secondary | ICD-10-CM | POA: Diagnosis not present

## 2019-12-15 DIAGNOSIS — I2510 Atherosclerotic heart disease of native coronary artery without angina pectoris: Secondary | ICD-10-CM | POA: Diagnosis not present

## 2019-12-15 DIAGNOSIS — E1165 Type 2 diabetes mellitus with hyperglycemia: Secondary | ICD-10-CM | POA: Diagnosis not present

## 2019-12-15 DIAGNOSIS — Z89431 Acquired absence of right foot: Secondary | ICD-10-CM | POA: Diagnosis not present

## 2019-12-15 DIAGNOSIS — I129 Hypertensive chronic kidney disease with stage 1 through stage 4 chronic kidney disease, or unspecified chronic kidney disease: Secondary | ICD-10-CM | POA: Diagnosis not present

## 2019-12-15 DIAGNOSIS — N183 Chronic kidney disease, stage 3 unspecified: Secondary | ICD-10-CM | POA: Diagnosis not present

## 2019-12-15 DIAGNOSIS — E1151 Type 2 diabetes mellitus with diabetic peripheral angiopathy without gangrene: Secondary | ICD-10-CM | POA: Diagnosis not present

## 2019-12-15 DIAGNOSIS — I480 Paroxysmal atrial fibrillation: Secondary | ICD-10-CM | POA: Diagnosis not present

## 2019-12-15 DIAGNOSIS — G8929 Other chronic pain: Secondary | ICD-10-CM | POA: Diagnosis not present

## 2019-12-15 DIAGNOSIS — E11621 Type 2 diabetes mellitus with foot ulcer: Secondary | ICD-10-CM | POA: Diagnosis not present

## 2019-12-21 DIAGNOSIS — I129 Hypertensive chronic kidney disease with stage 1 through stage 4 chronic kidney disease, or unspecified chronic kidney disease: Secondary | ICD-10-CM | POA: Diagnosis not present

## 2019-12-21 DIAGNOSIS — L97422 Non-pressure chronic ulcer of left heel and midfoot with fat layer exposed: Secondary | ICD-10-CM | POA: Diagnosis not present

## 2019-12-21 DIAGNOSIS — E1151 Type 2 diabetes mellitus with diabetic peripheral angiopathy without gangrene: Secondary | ICD-10-CM | POA: Diagnosis not present

## 2019-12-21 DIAGNOSIS — I480 Paroxysmal atrial fibrillation: Secondary | ICD-10-CM | POA: Diagnosis not present

## 2019-12-21 DIAGNOSIS — M5116 Intervertebral disc disorders with radiculopathy, lumbar region: Secondary | ICD-10-CM | POA: Diagnosis not present

## 2019-12-21 DIAGNOSIS — N183 Chronic kidney disease, stage 3 unspecified: Secondary | ICD-10-CM | POA: Diagnosis not present

## 2019-12-21 DIAGNOSIS — D631 Anemia in chronic kidney disease: Secondary | ICD-10-CM | POA: Diagnosis not present

## 2019-12-21 DIAGNOSIS — E1165 Type 2 diabetes mellitus with hyperglycemia: Secondary | ICD-10-CM | POA: Diagnosis not present

## 2019-12-21 DIAGNOSIS — E11621 Type 2 diabetes mellitus with foot ulcer: Secondary | ICD-10-CM | POA: Diagnosis not present

## 2019-12-21 DIAGNOSIS — E1142 Type 2 diabetes mellitus with diabetic polyneuropathy: Secondary | ICD-10-CM | POA: Diagnosis not present

## 2019-12-21 DIAGNOSIS — Z89431 Acquired absence of right foot: Secondary | ICD-10-CM | POA: Diagnosis not present

## 2019-12-21 DIAGNOSIS — G8929 Other chronic pain: Secondary | ICD-10-CM | POA: Diagnosis not present

## 2019-12-21 DIAGNOSIS — Z794 Long term (current) use of insulin: Secondary | ICD-10-CM | POA: Diagnosis not present

## 2019-12-21 DIAGNOSIS — I2510 Atherosclerotic heart disease of native coronary artery without angina pectoris: Secondary | ICD-10-CM | POA: Diagnosis not present

## 2019-12-21 DIAGNOSIS — E1122 Type 2 diabetes mellitus with diabetic chronic kidney disease: Secondary | ICD-10-CM | POA: Diagnosis not present

## 2019-12-25 DIAGNOSIS — Z89422 Acquired absence of other left toe(s): Secondary | ICD-10-CM | POA: Diagnosis not present

## 2019-12-25 DIAGNOSIS — L03115 Cellulitis of right lower limb: Secondary | ICD-10-CM | POA: Diagnosis not present

## 2019-12-25 DIAGNOSIS — R2689 Other abnormalities of gait and mobility: Secondary | ICD-10-CM | POA: Diagnosis not present

## 2019-12-27 DIAGNOSIS — Z0000 Encounter for general adult medical examination without abnormal findings: Secondary | ICD-10-CM | POA: Diagnosis not present

## 2019-12-27 DIAGNOSIS — E785 Hyperlipidemia, unspecified: Secondary | ICD-10-CM | POA: Diagnosis not present

## 2019-12-27 DIAGNOSIS — Z9181 History of falling: Secondary | ICD-10-CM | POA: Diagnosis not present

## 2019-12-29 DIAGNOSIS — E118 Type 2 diabetes mellitus with unspecified complications: Secondary | ICD-10-CM | POA: Diagnosis not present

## 2019-12-29 DIAGNOSIS — I2699 Other pulmonary embolism without acute cor pulmonale: Secondary | ICD-10-CM | POA: Diagnosis not present

## 2019-12-29 DIAGNOSIS — E785 Hyperlipidemia, unspecified: Secondary | ICD-10-CM | POA: Diagnosis not present

## 2019-12-29 DIAGNOSIS — L0390 Cellulitis, unspecified: Secondary | ICD-10-CM | POA: Diagnosis not present

## 2019-12-29 DIAGNOSIS — I10 Essential (primary) hypertension: Secondary | ICD-10-CM | POA: Diagnosis not present

## 2019-12-29 DIAGNOSIS — Z79899 Other long term (current) drug therapy: Secondary | ICD-10-CM | POA: Diagnosis not present

## 2019-12-29 DIAGNOSIS — M25511 Pain in right shoulder: Secondary | ICD-10-CM | POA: Diagnosis not present

## 2020-01-05 DIAGNOSIS — I2699 Other pulmonary embolism without acute cor pulmonale: Secondary | ICD-10-CM | POA: Diagnosis not present

## 2020-01-05 DIAGNOSIS — R252 Cramp and spasm: Secondary | ICD-10-CM | POA: Diagnosis not present

## 2020-01-05 DIAGNOSIS — M25511 Pain in right shoulder: Secondary | ICD-10-CM | POA: Diagnosis not present

## 2020-01-25 DIAGNOSIS — L03115 Cellulitis of right lower limb: Secondary | ICD-10-CM | POA: Diagnosis not present

## 2020-01-25 DIAGNOSIS — R2689 Other abnormalities of gait and mobility: Secondary | ICD-10-CM | POA: Diagnosis not present

## 2020-01-25 DIAGNOSIS — Z89422 Acquired absence of other left toe(s): Secondary | ICD-10-CM | POA: Diagnosis not present

## 2020-02-01 DIAGNOSIS — L02611 Cutaneous abscess of right foot: Secondary | ICD-10-CM | POA: Diagnosis not present

## 2020-02-01 DIAGNOSIS — M549 Dorsalgia, unspecified: Secondary | ICD-10-CM | POA: Diagnosis not present

## 2020-02-01 DIAGNOSIS — R252 Cramp and spasm: Secondary | ICD-10-CM | POA: Diagnosis not present

## 2020-02-01 DIAGNOSIS — Z89431 Acquired absence of right foot: Secondary | ICD-10-CM | POA: Diagnosis not present

## 2020-02-01 DIAGNOSIS — E11621 Type 2 diabetes mellitus with foot ulcer: Secondary | ICD-10-CM | POA: Diagnosis not present

## 2020-02-01 DIAGNOSIS — L97422 Non-pressure chronic ulcer of left heel and midfoot with fat layer exposed: Secondary | ICD-10-CM | POA: Diagnosis not present

## 2020-02-01 DIAGNOSIS — E1142 Type 2 diabetes mellitus with diabetic polyneuropathy: Secondary | ICD-10-CM | POA: Diagnosis not present

## 2020-02-01 DIAGNOSIS — I2699 Other pulmonary embolism without acute cor pulmonale: Secondary | ICD-10-CM | POA: Diagnosis not present

## 2020-02-02 DIAGNOSIS — L02611 Cutaneous abscess of right foot: Secondary | ICD-10-CM | POA: Diagnosis not present

## 2020-02-10 DIAGNOSIS — E118 Type 2 diabetes mellitus with unspecified complications: Secondary | ICD-10-CM | POA: Diagnosis not present

## 2020-02-10 DIAGNOSIS — E785 Hyperlipidemia, unspecified: Secondary | ICD-10-CM | POA: Diagnosis not present

## 2020-02-10 DIAGNOSIS — I10 Essential (primary) hypertension: Secondary | ICD-10-CM | POA: Diagnosis not present

## 2020-02-15 DIAGNOSIS — E1142 Type 2 diabetes mellitus with diabetic polyneuropathy: Secondary | ICD-10-CM | POA: Diagnosis not present

## 2020-02-15 DIAGNOSIS — L97422 Non-pressure chronic ulcer of left heel and midfoot with fat layer exposed: Secondary | ICD-10-CM | POA: Diagnosis not present

## 2020-02-15 DIAGNOSIS — Z89431 Acquired absence of right foot: Secondary | ICD-10-CM | POA: Diagnosis not present

## 2020-02-15 DIAGNOSIS — E11621 Type 2 diabetes mellitus with foot ulcer: Secondary | ICD-10-CM | POA: Diagnosis not present

## 2020-02-15 DIAGNOSIS — Z89412 Acquired absence of left great toe: Secondary | ICD-10-CM | POA: Diagnosis not present

## 2020-02-17 DIAGNOSIS — I2510 Atherosclerotic heart disease of native coronary artery without angina pectoris: Secondary | ICD-10-CM | POA: Diagnosis not present

## 2020-02-17 DIAGNOSIS — G8911 Acute pain due to trauma: Secondary | ICD-10-CM | POA: Diagnosis not present

## 2020-02-17 DIAGNOSIS — E119 Type 2 diabetes mellitus without complications: Secondary | ICD-10-CM | POA: Diagnosis not present

## 2020-02-17 DIAGNOSIS — E785 Hyperlipidemia, unspecified: Secondary | ICD-10-CM | POA: Diagnosis not present

## 2020-02-17 DIAGNOSIS — M79641 Pain in right hand: Secondary | ICD-10-CM | POA: Diagnosis not present

## 2020-02-17 DIAGNOSIS — I10 Essential (primary) hypertension: Secondary | ICD-10-CM | POA: Diagnosis not present

## 2020-02-17 DIAGNOSIS — S63501A Unspecified sprain of right wrist, initial encounter: Secondary | ICD-10-CM | POA: Diagnosis not present

## 2020-02-17 DIAGNOSIS — M25531 Pain in right wrist: Secondary | ICD-10-CM | POA: Diagnosis not present

## 2020-02-17 DIAGNOSIS — Z86711 Personal history of pulmonary embolism: Secondary | ICD-10-CM | POA: Diagnosis not present

## 2020-02-23 DIAGNOSIS — M79672 Pain in left foot: Secondary | ICD-10-CM | POA: Diagnosis not present

## 2020-02-23 DIAGNOSIS — Z5321 Procedure and treatment not carried out due to patient leaving prior to being seen by health care provider: Secondary | ICD-10-CM | POA: Diagnosis not present

## 2020-02-25 DIAGNOSIS — Z89422 Acquired absence of other left toe(s): Secondary | ICD-10-CM | POA: Diagnosis not present

## 2020-02-25 DIAGNOSIS — S91302A Unspecified open wound, left foot, initial encounter: Secondary | ICD-10-CM | POA: Diagnosis not present

## 2020-02-25 DIAGNOSIS — R2689 Other abnormalities of gait and mobility: Secondary | ICD-10-CM | POA: Diagnosis not present

## 2020-02-25 DIAGNOSIS — L03115 Cellulitis of right lower limb: Secondary | ICD-10-CM | POA: Diagnosis not present

## 2020-02-28 DIAGNOSIS — Z89412 Acquired absence of left great toe: Secondary | ICD-10-CM | POA: Diagnosis not present

## 2020-02-28 DIAGNOSIS — E11621 Type 2 diabetes mellitus with foot ulcer: Secondary | ICD-10-CM | POA: Diagnosis not present

## 2020-02-28 DIAGNOSIS — E1142 Type 2 diabetes mellitus with diabetic polyneuropathy: Secondary | ICD-10-CM | POA: Diagnosis not present

## 2020-02-28 DIAGNOSIS — Z89431 Acquired absence of right foot: Secondary | ICD-10-CM | POA: Diagnosis not present

## 2020-02-28 DIAGNOSIS — L97425 Non-pressure chronic ulcer of left heel and midfoot with muscle involvement without evidence of necrosis: Secondary | ICD-10-CM | POA: Diagnosis not present

## 2020-03-07 DIAGNOSIS — E785 Hyperlipidemia, unspecified: Secondary | ICD-10-CM | POA: Diagnosis not present

## 2020-03-07 DIAGNOSIS — E11621 Type 2 diabetes mellitus with foot ulcer: Secondary | ICD-10-CM | POA: Diagnosis not present

## 2020-03-07 DIAGNOSIS — L97422 Non-pressure chronic ulcer of left heel and midfoot with fat layer exposed: Secondary | ICD-10-CM | POA: Diagnosis not present

## 2020-03-07 DIAGNOSIS — Z89422 Acquired absence of other left toe(s): Secondary | ICD-10-CM | POA: Diagnosis not present

## 2020-03-07 DIAGNOSIS — E1142 Type 2 diabetes mellitus with diabetic polyneuropathy: Secondary | ICD-10-CM | POA: Diagnosis not present

## 2020-03-07 DIAGNOSIS — I10 Essential (primary) hypertension: Secondary | ICD-10-CM | POA: Diagnosis not present

## 2020-03-07 DIAGNOSIS — Z89412 Acquired absence of left great toe: Secondary | ICD-10-CM | POA: Diagnosis not present

## 2020-03-07 DIAGNOSIS — E118 Type 2 diabetes mellitus with unspecified complications: Secondary | ICD-10-CM | POA: Diagnosis not present

## 2020-03-21 DIAGNOSIS — Z89412 Acquired absence of left great toe: Secondary | ICD-10-CM | POA: Diagnosis not present

## 2020-03-21 DIAGNOSIS — E11621 Type 2 diabetes mellitus with foot ulcer: Secondary | ICD-10-CM | POA: Diagnosis not present

## 2020-03-21 DIAGNOSIS — E1142 Type 2 diabetes mellitus with diabetic polyneuropathy: Secondary | ICD-10-CM | POA: Diagnosis not present

## 2020-03-21 DIAGNOSIS — Z89422 Acquired absence of other left toe(s): Secondary | ICD-10-CM | POA: Diagnosis not present

## 2020-03-21 DIAGNOSIS — L97421 Non-pressure chronic ulcer of left heel and midfoot limited to breakdown of skin: Secondary | ICD-10-CM | POA: Diagnosis not present

## 2020-03-26 DIAGNOSIS — L03115 Cellulitis of right lower limb: Secondary | ICD-10-CM | POA: Diagnosis not present

## 2020-03-26 DIAGNOSIS — R2689 Other abnormalities of gait and mobility: Secondary | ICD-10-CM | POA: Diagnosis not present

## 2020-03-26 DIAGNOSIS — Z89422 Acquired absence of other left toe(s): Secondary | ICD-10-CM | POA: Diagnosis not present

## 2020-04-01 DIAGNOSIS — E785 Hyperlipidemia, unspecified: Secondary | ICD-10-CM | POA: Diagnosis not present

## 2020-04-01 DIAGNOSIS — Z89429 Acquired absence of other toe(s), unspecified side: Secondary | ICD-10-CM | POA: Diagnosis not present

## 2020-04-01 DIAGNOSIS — Z23 Encounter for immunization: Secondary | ICD-10-CM | POA: Diagnosis not present

## 2020-04-01 DIAGNOSIS — Z7984 Long term (current) use of oral hypoglycemic drugs: Secondary | ICD-10-CM | POA: Diagnosis not present

## 2020-04-01 DIAGNOSIS — I129 Hypertensive chronic kidney disease with stage 1 through stage 4 chronic kidney disease, or unspecified chronic kidney disease: Secondary | ICD-10-CM | POA: Diagnosis not present

## 2020-04-01 DIAGNOSIS — R4020 Unspecified coma: Secondary | ICD-10-CM | POA: Diagnosis not present

## 2020-04-01 DIAGNOSIS — Z7901 Long term (current) use of anticoagulants: Secondary | ICD-10-CM | POA: Diagnosis not present

## 2020-04-01 DIAGNOSIS — R42 Dizziness and giddiness: Secondary | ICD-10-CM | POA: Diagnosis not present

## 2020-04-01 DIAGNOSIS — N189 Chronic kidney disease, unspecified: Secondary | ICD-10-CM | POA: Diagnosis not present

## 2020-04-01 DIAGNOSIS — E1122 Type 2 diabetes mellitus with diabetic chronic kidney disease: Secondary | ICD-10-CM | POA: Diagnosis not present

## 2020-04-01 DIAGNOSIS — Z743 Need for continuous supervision: Secondary | ICD-10-CM | POA: Diagnosis not present

## 2020-04-01 DIAGNOSIS — R519 Headache, unspecified: Secondary | ICD-10-CM | POA: Diagnosis not present

## 2020-04-01 DIAGNOSIS — Z79899 Other long term (current) drug therapy: Secondary | ICD-10-CM | POA: Diagnosis not present

## 2020-04-01 DIAGNOSIS — R531 Weakness: Secondary | ICD-10-CM | POA: Diagnosis not present

## 2020-04-01 DIAGNOSIS — I4891 Unspecified atrial fibrillation: Secondary | ICD-10-CM | POA: Diagnosis not present

## 2020-04-01 DIAGNOSIS — Z86711 Personal history of pulmonary embolism: Secondary | ICD-10-CM | POA: Diagnosis not present

## 2020-04-01 DIAGNOSIS — E119 Type 2 diabetes mellitus without complications: Secondary | ICD-10-CM | POA: Diagnosis not present

## 2020-04-01 DIAGNOSIS — R55 Syncope and collapse: Secondary | ICD-10-CM | POA: Diagnosis not present

## 2020-04-01 DIAGNOSIS — R079 Chest pain, unspecified: Secondary | ICD-10-CM | POA: Diagnosis not present

## 2020-04-01 DIAGNOSIS — R404 Transient alteration of awareness: Secondary | ICD-10-CM | POA: Diagnosis not present

## 2020-04-01 DIAGNOSIS — H9319 Tinnitus, unspecified ear: Secondary | ICD-10-CM | POA: Diagnosis not present

## 2020-04-01 DIAGNOSIS — I69354 Hemiplegia and hemiparesis following cerebral infarction affecting left non-dominant side: Secondary | ICD-10-CM | POA: Diagnosis not present

## 2020-04-16 DIAGNOSIS — E11621 Type 2 diabetes mellitus with foot ulcer: Secondary | ICD-10-CM | POA: Diagnosis not present

## 2020-04-16 DIAGNOSIS — Z89422 Acquired absence of other left toe(s): Secondary | ICD-10-CM | POA: Diagnosis not present

## 2020-04-16 DIAGNOSIS — E118 Type 2 diabetes mellitus with unspecified complications: Secondary | ICD-10-CM | POA: Diagnosis not present

## 2020-04-16 DIAGNOSIS — E785 Hyperlipidemia, unspecified: Secondary | ICD-10-CM | POA: Diagnosis not present

## 2020-04-16 DIAGNOSIS — Z89412 Acquired absence of left great toe: Secondary | ICD-10-CM | POA: Diagnosis not present

## 2020-04-16 DIAGNOSIS — L97421 Non-pressure chronic ulcer of left heel and midfoot limited to breakdown of skin: Secondary | ICD-10-CM | POA: Diagnosis not present

## 2020-04-16 DIAGNOSIS — E1142 Type 2 diabetes mellitus with diabetic polyneuropathy: Secondary | ICD-10-CM | POA: Diagnosis not present

## 2020-10-22 DIAGNOSIS — I2510 Atherosclerotic heart disease of native coronary artery without angina pectoris: Secondary | ICD-10-CM | POA: Diagnosis not present

## 2020-10-22 DIAGNOSIS — E1142 Type 2 diabetes mellitus with diabetic polyneuropathy: Secondary | ICD-10-CM | POA: Diagnosis not present

## 2020-10-22 DIAGNOSIS — Z794 Long term (current) use of insulin: Secondary | ICD-10-CM | POA: Diagnosis not present

## 2020-10-22 DIAGNOSIS — L03116 Cellulitis of left lower limb: Secondary | ICD-10-CM | POA: Diagnosis not present

## 2020-10-22 DIAGNOSIS — R110 Nausea: Secondary | ICD-10-CM | POA: Diagnosis not present

## 2020-10-22 DIAGNOSIS — M5136 Other intervertebral disc degeneration, lumbar region: Secondary | ICD-10-CM | POA: Diagnosis not present

## 2020-10-22 DIAGNOSIS — Z89412 Acquired absence of left great toe: Secondary | ICD-10-CM | POA: Diagnosis not present

## 2020-10-22 DIAGNOSIS — Z792 Long term (current) use of antibiotics: Secondary | ICD-10-CM | POA: Diagnosis not present

## 2020-10-22 DIAGNOSIS — E1122 Type 2 diabetes mellitus with diabetic chronic kidney disease: Secondary | ICD-10-CM | POA: Diagnosis not present

## 2020-10-22 DIAGNOSIS — E1159 Type 2 diabetes mellitus with other circulatory complications: Secondary | ICD-10-CM | POA: Diagnosis not present

## 2020-10-22 DIAGNOSIS — K5790 Diverticulosis of intestine, part unspecified, without perforation or abscess without bleeding: Secondary | ICD-10-CM | POA: Diagnosis not present

## 2020-10-22 DIAGNOSIS — D649 Anemia, unspecified: Secondary | ICD-10-CM | POA: Diagnosis not present

## 2020-10-22 DIAGNOSIS — G8929 Other chronic pain: Secondary | ICD-10-CM | POA: Diagnosis not present

## 2020-10-22 DIAGNOSIS — L03119 Cellulitis of unspecified part of limb: Secondary | ICD-10-CM | POA: Diagnosis not present

## 2020-10-22 DIAGNOSIS — I2699 Other pulmonary embolism without acute cor pulmonale: Secondary | ICD-10-CM | POA: Diagnosis not present

## 2020-10-22 DIAGNOSIS — M869 Osteomyelitis, unspecified: Secondary | ICD-10-CM | POA: Diagnosis not present

## 2020-10-22 DIAGNOSIS — M65862 Other synovitis and tenosynovitis, left lower leg: Secondary | ICD-10-CM | POA: Diagnosis not present

## 2020-10-22 DIAGNOSIS — E11628 Type 2 diabetes mellitus with other skin complications: Secondary | ICD-10-CM | POA: Diagnosis not present

## 2020-10-22 DIAGNOSIS — M159 Polyosteoarthritis, unspecified: Secondary | ICD-10-CM | POA: Diagnosis not present

## 2020-10-22 DIAGNOSIS — I129 Hypertensive chronic kidney disease with stage 1 through stage 4 chronic kidney disease, or unspecified chronic kidney disease: Secondary | ICD-10-CM | POA: Diagnosis not present

## 2020-10-22 DIAGNOSIS — E785 Hyperlipidemia, unspecified: Secondary | ICD-10-CM | POA: Diagnosis not present

## 2020-10-22 DIAGNOSIS — Z89411 Acquired absence of right great toe: Secondary | ICD-10-CM | POA: Diagnosis not present

## 2020-10-22 DIAGNOSIS — E11621 Type 2 diabetes mellitus with foot ulcer: Secondary | ICD-10-CM | POA: Diagnosis not present

## 2020-10-22 DIAGNOSIS — N1831 Chronic kidney disease, stage 3a: Secondary | ICD-10-CM | POA: Diagnosis not present

## 2020-10-22 DIAGNOSIS — Z8631 Personal history of diabetic foot ulcer: Secondary | ICD-10-CM | POA: Diagnosis not present

## 2020-10-22 DIAGNOSIS — L97509 Non-pressure chronic ulcer of other part of unspecified foot with unspecified severity: Secondary | ICD-10-CM | POA: Diagnosis not present

## 2020-10-22 DIAGNOSIS — E1140 Type 2 diabetes mellitus with diabetic neuropathy, unspecified: Secondary | ICD-10-CM | POA: Diagnosis not present

## 2020-10-22 DIAGNOSIS — M545 Low back pain, unspecified: Secondary | ICD-10-CM | POA: Diagnosis not present

## 2020-10-22 DIAGNOSIS — E1151 Type 2 diabetes mellitus with diabetic peripheral angiopathy without gangrene: Secondary | ICD-10-CM | POA: Diagnosis not present

## 2020-10-22 DIAGNOSIS — R252 Cramp and spasm: Secondary | ICD-10-CM | POA: Diagnosis not present

## 2020-10-22 DIAGNOSIS — L089 Local infection of the skin and subcutaneous tissue, unspecified: Secondary | ICD-10-CM | POA: Diagnosis not present

## 2020-10-22 DIAGNOSIS — Z452 Encounter for adjustment and management of vascular access device: Secondary | ICD-10-CM | POA: Diagnosis not present

## 2020-10-24 DIAGNOSIS — M545 Low back pain, unspecified: Secondary | ICD-10-CM | POA: Diagnosis not present

## 2020-10-24 DIAGNOSIS — E1151 Type 2 diabetes mellitus with diabetic peripheral angiopathy without gangrene: Secondary | ICD-10-CM | POA: Diagnosis not present

## 2020-10-24 DIAGNOSIS — Z8631 Personal history of diabetic foot ulcer: Secondary | ICD-10-CM | POA: Diagnosis not present

## 2020-10-24 DIAGNOSIS — K5790 Diverticulosis of intestine, part unspecified, without perforation or abscess without bleeding: Secondary | ICD-10-CM | POA: Diagnosis not present

## 2020-10-24 DIAGNOSIS — E1122 Type 2 diabetes mellitus with diabetic chronic kidney disease: Secondary | ICD-10-CM | POA: Diagnosis not present

## 2020-10-24 DIAGNOSIS — M5136 Other intervertebral disc degeneration, lumbar region: Secondary | ICD-10-CM | POA: Diagnosis not present

## 2020-10-24 DIAGNOSIS — M65862 Other synovitis and tenosynovitis, left lower leg: Secondary | ICD-10-CM | POA: Diagnosis not present

## 2020-10-24 DIAGNOSIS — Z792 Long term (current) use of antibiotics: Secondary | ICD-10-CM | POA: Diagnosis not present

## 2020-10-24 DIAGNOSIS — Z452 Encounter for adjustment and management of vascular access device: Secondary | ICD-10-CM | POA: Diagnosis not present

## 2020-10-24 DIAGNOSIS — I129 Hypertensive chronic kidney disease with stage 1 through stage 4 chronic kidney disease, or unspecified chronic kidney disease: Secondary | ICD-10-CM | POA: Diagnosis not present

## 2020-10-24 DIAGNOSIS — E1142 Type 2 diabetes mellitus with diabetic polyneuropathy: Secondary | ICD-10-CM | POA: Diagnosis not present

## 2020-10-24 DIAGNOSIS — E785 Hyperlipidemia, unspecified: Secondary | ICD-10-CM | POA: Diagnosis not present

## 2020-10-24 DIAGNOSIS — N1831 Chronic kidney disease, stage 3a: Secondary | ICD-10-CM | POA: Diagnosis not present

## 2020-10-24 DIAGNOSIS — G8929 Other chronic pain: Secondary | ICD-10-CM | POA: Diagnosis not present

## 2020-10-24 DIAGNOSIS — L03116 Cellulitis of left lower limb: Secondary | ICD-10-CM | POA: Diagnosis not present

## 2020-10-24 DIAGNOSIS — I2510 Atherosclerotic heart disease of native coronary artery without angina pectoris: Secondary | ICD-10-CM | POA: Diagnosis not present

## 2020-10-24 DIAGNOSIS — Z89411 Acquired absence of right great toe: Secondary | ICD-10-CM | POA: Diagnosis not present

## 2020-10-24 DIAGNOSIS — Z89412 Acquired absence of left great toe: Secondary | ICD-10-CM | POA: Diagnosis not present

## 2020-10-24 DIAGNOSIS — Z794 Long term (current) use of insulin: Secondary | ICD-10-CM | POA: Diagnosis not present

## 2020-10-25 DIAGNOSIS — M545 Low back pain, unspecified: Secondary | ICD-10-CM | POA: Diagnosis not present

## 2020-10-25 DIAGNOSIS — L03116 Cellulitis of left lower limb: Secondary | ICD-10-CM | POA: Diagnosis not present

## 2020-10-25 DIAGNOSIS — Z89411 Acquired absence of right great toe: Secondary | ICD-10-CM | POA: Diagnosis not present

## 2020-10-25 DIAGNOSIS — M65862 Other synovitis and tenosynovitis, left lower leg: Secondary | ICD-10-CM | POA: Diagnosis not present

## 2020-10-25 DIAGNOSIS — Z794 Long term (current) use of insulin: Secondary | ICD-10-CM | POA: Diagnosis not present

## 2020-10-25 DIAGNOSIS — Z8631 Personal history of diabetic foot ulcer: Secondary | ICD-10-CM | POA: Diagnosis not present

## 2020-10-25 DIAGNOSIS — M5136 Other intervertebral disc degeneration, lumbar region: Secondary | ICD-10-CM | POA: Diagnosis not present

## 2020-10-25 DIAGNOSIS — Z792 Long term (current) use of antibiotics: Secondary | ICD-10-CM | POA: Diagnosis not present

## 2020-10-25 DIAGNOSIS — Z89412 Acquired absence of left great toe: Secondary | ICD-10-CM | POA: Diagnosis not present

## 2020-10-25 DIAGNOSIS — K859 Acute pancreatitis without necrosis or infection, unspecified: Secondary | ICD-10-CM | POA: Diagnosis not present

## 2020-10-25 DIAGNOSIS — N1831 Chronic kidney disease, stage 3a: Secondary | ICD-10-CM | POA: Diagnosis not present

## 2020-10-25 DIAGNOSIS — E1122 Type 2 diabetes mellitus with diabetic chronic kidney disease: Secondary | ICD-10-CM | POA: Diagnosis not present

## 2020-10-25 DIAGNOSIS — K5790 Diverticulosis of intestine, part unspecified, without perforation or abscess without bleeding: Secondary | ICD-10-CM | POA: Diagnosis not present

## 2020-10-25 DIAGNOSIS — E1142 Type 2 diabetes mellitus with diabetic polyneuropathy: Secondary | ICD-10-CM | POA: Diagnosis not present

## 2020-10-25 DIAGNOSIS — G8929 Other chronic pain: Secondary | ICD-10-CM | POA: Diagnosis not present

## 2020-10-25 DIAGNOSIS — E1151 Type 2 diabetes mellitus with diabetic peripheral angiopathy without gangrene: Secondary | ICD-10-CM | POA: Diagnosis not present

## 2020-10-25 DIAGNOSIS — E785 Hyperlipidemia, unspecified: Secondary | ICD-10-CM | POA: Diagnosis not present

## 2020-10-25 DIAGNOSIS — Z452 Encounter for adjustment and management of vascular access device: Secondary | ICD-10-CM | POA: Diagnosis not present

## 2020-10-25 DIAGNOSIS — I129 Hypertensive chronic kidney disease with stage 1 through stage 4 chronic kidney disease, or unspecified chronic kidney disease: Secondary | ICD-10-CM | POA: Diagnosis not present

## 2020-10-25 DIAGNOSIS — I2510 Atherosclerotic heart disease of native coronary artery without angina pectoris: Secondary | ICD-10-CM | POA: Diagnosis not present

## 2020-10-26 DIAGNOSIS — Z89431 Acquired absence of right foot: Secondary | ICD-10-CM | POA: Diagnosis not present

## 2020-10-26 DIAGNOSIS — L089 Local infection of the skin and subcutaneous tissue, unspecified: Secondary | ICD-10-CM | POA: Diagnosis not present

## 2020-10-26 DIAGNOSIS — E11628 Type 2 diabetes mellitus with other skin complications: Secondary | ICD-10-CM | POA: Diagnosis not present

## 2020-10-26 DIAGNOSIS — Z89422 Acquired absence of other left toe(s): Secondary | ICD-10-CM | POA: Diagnosis not present

## 2020-10-26 DIAGNOSIS — E1142 Type 2 diabetes mellitus with diabetic polyneuropathy: Secondary | ICD-10-CM | POA: Diagnosis not present

## 2020-10-26 DIAGNOSIS — Z89412 Acquired absence of left great toe: Secondary | ICD-10-CM | POA: Diagnosis not present

## 2020-10-29 DIAGNOSIS — K5790 Diverticulosis of intestine, part unspecified, without perforation or abscess without bleeding: Secondary | ICD-10-CM | POA: Diagnosis not present

## 2020-10-29 DIAGNOSIS — I129 Hypertensive chronic kidney disease with stage 1 through stage 4 chronic kidney disease, or unspecified chronic kidney disease: Secondary | ICD-10-CM | POA: Diagnosis not present

## 2020-10-29 DIAGNOSIS — M545 Low back pain, unspecified: Secondary | ICD-10-CM | POA: Diagnosis not present

## 2020-10-29 DIAGNOSIS — E1122 Type 2 diabetes mellitus with diabetic chronic kidney disease: Secondary | ICD-10-CM | POA: Diagnosis not present

## 2020-10-29 DIAGNOSIS — Z89411 Acquired absence of right great toe: Secondary | ICD-10-CM | POA: Diagnosis not present

## 2020-10-29 DIAGNOSIS — L03116 Cellulitis of left lower limb: Secondary | ICD-10-CM | POA: Diagnosis not present

## 2020-10-29 DIAGNOSIS — Z89412 Acquired absence of left great toe: Secondary | ICD-10-CM | POA: Diagnosis not present

## 2020-10-29 DIAGNOSIS — Z792 Long term (current) use of antibiotics: Secondary | ICD-10-CM | POA: Diagnosis not present

## 2020-10-29 DIAGNOSIS — Z8631 Personal history of diabetic foot ulcer: Secondary | ICD-10-CM | POA: Diagnosis not present

## 2020-10-29 DIAGNOSIS — N1831 Chronic kidney disease, stage 3a: Secondary | ICD-10-CM | POA: Diagnosis not present

## 2020-10-29 DIAGNOSIS — M65862 Other synovitis and tenosynovitis, left lower leg: Secondary | ICD-10-CM | POA: Diagnosis not present

## 2020-10-29 DIAGNOSIS — E785 Hyperlipidemia, unspecified: Secondary | ICD-10-CM | POA: Diagnosis not present

## 2020-10-29 DIAGNOSIS — G8929 Other chronic pain: Secondary | ICD-10-CM | POA: Diagnosis not present

## 2020-10-29 DIAGNOSIS — Z794 Long term (current) use of insulin: Secondary | ICD-10-CM | POA: Diagnosis not present

## 2020-10-29 DIAGNOSIS — E1142 Type 2 diabetes mellitus with diabetic polyneuropathy: Secondary | ICD-10-CM | POA: Diagnosis not present

## 2020-10-29 DIAGNOSIS — E1151 Type 2 diabetes mellitus with diabetic peripheral angiopathy without gangrene: Secondary | ICD-10-CM | POA: Diagnosis not present

## 2020-10-29 DIAGNOSIS — Z452 Encounter for adjustment and management of vascular access device: Secondary | ICD-10-CM | POA: Diagnosis not present

## 2020-10-29 DIAGNOSIS — I2510 Atherosclerotic heart disease of native coronary artery without angina pectoris: Secondary | ICD-10-CM | POA: Diagnosis not present

## 2020-10-29 DIAGNOSIS — M5136 Other intervertebral disc degeneration, lumbar region: Secondary | ICD-10-CM | POA: Diagnosis not present

## 2020-10-31 DIAGNOSIS — E785 Hyperlipidemia, unspecified: Secondary | ICD-10-CM | POA: Diagnosis not present

## 2020-10-31 DIAGNOSIS — Z794 Long term (current) use of insulin: Secondary | ICD-10-CM | POA: Diagnosis not present

## 2020-10-31 DIAGNOSIS — Z89412 Acquired absence of left great toe: Secondary | ICD-10-CM | POA: Diagnosis not present

## 2020-10-31 DIAGNOSIS — M5136 Other intervertebral disc degeneration, lumbar region: Secondary | ICD-10-CM | POA: Diagnosis not present

## 2020-10-31 DIAGNOSIS — M545 Low back pain, unspecified: Secondary | ICD-10-CM | POA: Diagnosis not present

## 2020-10-31 DIAGNOSIS — E1122 Type 2 diabetes mellitus with diabetic chronic kidney disease: Secondary | ICD-10-CM | POA: Diagnosis not present

## 2020-10-31 DIAGNOSIS — Z8631 Personal history of diabetic foot ulcer: Secondary | ICD-10-CM | POA: Diagnosis not present

## 2020-10-31 DIAGNOSIS — Z792 Long term (current) use of antibiotics: Secondary | ICD-10-CM | POA: Diagnosis not present

## 2020-10-31 DIAGNOSIS — I2510 Atherosclerotic heart disease of native coronary artery without angina pectoris: Secondary | ICD-10-CM | POA: Diagnosis not present

## 2020-10-31 DIAGNOSIS — N1831 Chronic kidney disease, stage 3a: Secondary | ICD-10-CM | POA: Diagnosis not present

## 2020-10-31 DIAGNOSIS — I129 Hypertensive chronic kidney disease with stage 1 through stage 4 chronic kidney disease, or unspecified chronic kidney disease: Secondary | ICD-10-CM | POA: Diagnosis not present

## 2020-10-31 DIAGNOSIS — E1151 Type 2 diabetes mellitus with diabetic peripheral angiopathy without gangrene: Secondary | ICD-10-CM | POA: Diagnosis not present

## 2020-10-31 DIAGNOSIS — G8929 Other chronic pain: Secondary | ICD-10-CM | POA: Diagnosis not present

## 2020-10-31 DIAGNOSIS — Z89411 Acquired absence of right great toe: Secondary | ICD-10-CM | POA: Diagnosis not present

## 2020-10-31 DIAGNOSIS — L03116 Cellulitis of left lower limb: Secondary | ICD-10-CM | POA: Diagnosis not present

## 2020-10-31 DIAGNOSIS — Z452 Encounter for adjustment and management of vascular access device: Secondary | ICD-10-CM | POA: Diagnosis not present

## 2020-10-31 DIAGNOSIS — K5790 Diverticulosis of intestine, part unspecified, without perforation or abscess without bleeding: Secondary | ICD-10-CM | POA: Diagnosis not present

## 2020-10-31 DIAGNOSIS — M65862 Other synovitis and tenosynovitis, left lower leg: Secondary | ICD-10-CM | POA: Diagnosis not present

## 2020-10-31 DIAGNOSIS — E1142 Type 2 diabetes mellitus with diabetic polyneuropathy: Secondary | ICD-10-CM | POA: Diagnosis not present

## 2020-11-02 DIAGNOSIS — I739 Peripheral vascular disease, unspecified: Secondary | ICD-10-CM | POA: Diagnosis not present

## 2020-11-02 DIAGNOSIS — I2699 Other pulmonary embolism without acute cor pulmonale: Secondary | ICD-10-CM | POA: Diagnosis not present

## 2020-11-02 DIAGNOSIS — I10 Essential (primary) hypertension: Secondary | ICD-10-CM | POA: Diagnosis not present

## 2020-11-02 DIAGNOSIS — E1140 Type 2 diabetes mellitus with diabetic neuropathy, unspecified: Secondary | ICD-10-CM | POA: Diagnosis not present

## 2020-11-02 DIAGNOSIS — E785 Hyperlipidemia, unspecified: Secondary | ICD-10-CM | POA: Diagnosis not present

## 2020-11-02 DIAGNOSIS — M159 Polyosteoarthritis, unspecified: Secondary | ICD-10-CM | POA: Diagnosis not present

## 2020-11-02 DIAGNOSIS — R252 Cramp and spasm: Secondary | ICD-10-CM | POA: Diagnosis not present

## 2020-11-02 DIAGNOSIS — E11621 Type 2 diabetes mellitus with foot ulcer: Secondary | ICD-10-CM | POA: Diagnosis not present

## 2020-11-02 DIAGNOSIS — D649 Anemia, unspecified: Secondary | ICD-10-CM | POA: Diagnosis not present

## 2020-11-02 DIAGNOSIS — E1159 Type 2 diabetes mellitus with other circulatory complications: Secondary | ICD-10-CM | POA: Diagnosis not present

## 2020-11-02 DIAGNOSIS — L97509 Non-pressure chronic ulcer of other part of unspecified foot with unspecified severity: Secondary | ICD-10-CM | POA: Diagnosis not present

## 2020-11-02 DIAGNOSIS — M25562 Pain in left knee: Secondary | ICD-10-CM | POA: Diagnosis not present

## 2020-11-06 DIAGNOSIS — Z89411 Acquired absence of right great toe: Secondary | ICD-10-CM | POA: Diagnosis not present

## 2020-11-06 DIAGNOSIS — Z8631 Personal history of diabetic foot ulcer: Secondary | ICD-10-CM | POA: Diagnosis not present

## 2020-11-06 DIAGNOSIS — G8929 Other chronic pain: Secondary | ICD-10-CM | POA: Diagnosis not present

## 2020-11-06 DIAGNOSIS — Z792 Long term (current) use of antibiotics: Secondary | ICD-10-CM | POA: Diagnosis not present

## 2020-11-06 DIAGNOSIS — K5790 Diverticulosis of intestine, part unspecified, without perforation or abscess without bleeding: Secondary | ICD-10-CM | POA: Diagnosis not present

## 2020-11-06 DIAGNOSIS — M65862 Other synovitis and tenosynovitis, left lower leg: Secondary | ICD-10-CM | POA: Diagnosis not present

## 2020-11-06 DIAGNOSIS — L03116 Cellulitis of left lower limb: Secondary | ICD-10-CM | POA: Diagnosis not present

## 2020-11-06 DIAGNOSIS — M545 Low back pain, unspecified: Secondary | ICD-10-CM | POA: Diagnosis not present

## 2020-11-06 DIAGNOSIS — Z89412 Acquired absence of left great toe: Secondary | ICD-10-CM | POA: Diagnosis not present

## 2020-11-06 DIAGNOSIS — E785 Hyperlipidemia, unspecified: Secondary | ICD-10-CM | POA: Diagnosis not present

## 2020-11-06 DIAGNOSIS — E1142 Type 2 diabetes mellitus with diabetic polyneuropathy: Secondary | ICD-10-CM | POA: Diagnosis not present

## 2020-11-06 DIAGNOSIS — Z452 Encounter for adjustment and management of vascular access device: Secondary | ICD-10-CM | POA: Diagnosis not present

## 2020-11-06 DIAGNOSIS — E1151 Type 2 diabetes mellitus with diabetic peripheral angiopathy without gangrene: Secondary | ICD-10-CM | POA: Diagnosis not present

## 2020-11-06 DIAGNOSIS — M5136 Other intervertebral disc degeneration, lumbar region: Secondary | ICD-10-CM | POA: Diagnosis not present

## 2020-11-06 DIAGNOSIS — I129 Hypertensive chronic kidney disease with stage 1 through stage 4 chronic kidney disease, or unspecified chronic kidney disease: Secondary | ICD-10-CM | POA: Diagnosis not present

## 2020-11-06 DIAGNOSIS — N1831 Chronic kidney disease, stage 3a: Secondary | ICD-10-CM | POA: Diagnosis not present

## 2020-11-06 DIAGNOSIS — Z794 Long term (current) use of insulin: Secondary | ICD-10-CM | POA: Diagnosis not present

## 2020-11-06 DIAGNOSIS — E1122 Type 2 diabetes mellitus with diabetic chronic kidney disease: Secondary | ICD-10-CM | POA: Diagnosis not present

## 2020-11-06 DIAGNOSIS — I2510 Atherosclerotic heart disease of native coronary artery without angina pectoris: Secondary | ICD-10-CM | POA: Diagnosis not present

## 2020-11-07 DIAGNOSIS — I2510 Atherosclerotic heart disease of native coronary artery without angina pectoris: Secondary | ICD-10-CM | POA: Diagnosis not present

## 2020-11-07 DIAGNOSIS — N1831 Chronic kidney disease, stage 3a: Secondary | ICD-10-CM | POA: Diagnosis not present

## 2020-11-07 DIAGNOSIS — M545 Low back pain, unspecified: Secondary | ICD-10-CM | POA: Diagnosis not present

## 2020-11-07 DIAGNOSIS — K5790 Diverticulosis of intestine, part unspecified, without perforation or abscess without bleeding: Secondary | ICD-10-CM | POA: Diagnosis not present

## 2020-11-07 DIAGNOSIS — E785 Hyperlipidemia, unspecified: Secondary | ICD-10-CM | POA: Diagnosis not present

## 2020-11-07 DIAGNOSIS — M65862 Other synovitis and tenosynovitis, left lower leg: Secondary | ICD-10-CM | POA: Diagnosis not present

## 2020-11-07 DIAGNOSIS — E1151 Type 2 diabetes mellitus with diabetic peripheral angiopathy without gangrene: Secondary | ICD-10-CM | POA: Diagnosis not present

## 2020-11-07 DIAGNOSIS — Z452 Encounter for adjustment and management of vascular access device: Secondary | ICD-10-CM | POA: Diagnosis not present

## 2020-11-07 DIAGNOSIS — Z794 Long term (current) use of insulin: Secondary | ICD-10-CM | POA: Diagnosis not present

## 2020-11-07 DIAGNOSIS — E1142 Type 2 diabetes mellitus with diabetic polyneuropathy: Secondary | ICD-10-CM | POA: Diagnosis not present

## 2020-11-07 DIAGNOSIS — Z8631 Personal history of diabetic foot ulcer: Secondary | ICD-10-CM | POA: Diagnosis not present

## 2020-11-07 DIAGNOSIS — Z792 Long term (current) use of antibiotics: Secondary | ICD-10-CM | POA: Diagnosis not present

## 2020-11-07 DIAGNOSIS — Z89412 Acquired absence of left great toe: Secondary | ICD-10-CM | POA: Diagnosis not present

## 2020-11-07 DIAGNOSIS — L03116 Cellulitis of left lower limb: Secondary | ICD-10-CM | POA: Diagnosis not present

## 2020-11-07 DIAGNOSIS — M5136 Other intervertebral disc degeneration, lumbar region: Secondary | ICD-10-CM | POA: Diagnosis not present

## 2020-11-07 DIAGNOSIS — Z89411 Acquired absence of right great toe: Secondary | ICD-10-CM | POA: Diagnosis not present

## 2020-11-07 DIAGNOSIS — I129 Hypertensive chronic kidney disease with stage 1 through stage 4 chronic kidney disease, or unspecified chronic kidney disease: Secondary | ICD-10-CM | POA: Diagnosis not present

## 2020-11-07 DIAGNOSIS — G8929 Other chronic pain: Secondary | ICD-10-CM | POA: Diagnosis not present

## 2020-11-07 DIAGNOSIS — E1122 Type 2 diabetes mellitus with diabetic chronic kidney disease: Secondary | ICD-10-CM | POA: Diagnosis not present

## 2020-11-12 DIAGNOSIS — M25561 Pain in right knee: Secondary | ICD-10-CM | POA: Diagnosis not present

## 2020-11-12 DIAGNOSIS — G8929 Other chronic pain: Secondary | ICD-10-CM | POA: Diagnosis not present

## 2020-11-12 DIAGNOSIS — Z79899 Other long term (current) drug therapy: Secondary | ICD-10-CM | POA: Diagnosis not present

## 2020-11-12 DIAGNOSIS — M5412 Radiculopathy, cervical region: Secondary | ICD-10-CM | POA: Diagnosis not present

## 2020-11-12 DIAGNOSIS — M5416 Radiculopathy, lumbar region: Secondary | ICD-10-CM | POA: Diagnosis not present

## 2020-11-12 DIAGNOSIS — M25562 Pain in left knee: Secondary | ICD-10-CM | POA: Diagnosis not present

## 2020-11-15 DIAGNOSIS — I129 Hypertensive chronic kidney disease with stage 1 through stage 4 chronic kidney disease, or unspecified chronic kidney disease: Secondary | ICD-10-CM | POA: Diagnosis not present

## 2020-11-15 DIAGNOSIS — M545 Low back pain, unspecified: Secondary | ICD-10-CM | POA: Diagnosis not present

## 2020-11-15 DIAGNOSIS — N1831 Chronic kidney disease, stage 3a: Secondary | ICD-10-CM | POA: Diagnosis not present

## 2020-11-15 DIAGNOSIS — K5790 Diverticulosis of intestine, part unspecified, without perforation or abscess without bleeding: Secondary | ICD-10-CM | POA: Diagnosis not present

## 2020-11-15 DIAGNOSIS — M5136 Other intervertebral disc degeneration, lumbar region: Secondary | ICD-10-CM | POA: Diagnosis not present

## 2020-11-15 DIAGNOSIS — E1142 Type 2 diabetes mellitus with diabetic polyneuropathy: Secondary | ICD-10-CM | POA: Diagnosis not present

## 2020-11-15 DIAGNOSIS — Z452 Encounter for adjustment and management of vascular access device: Secondary | ICD-10-CM | POA: Diagnosis not present

## 2020-11-15 DIAGNOSIS — M65862 Other synovitis and tenosynovitis, left lower leg: Secondary | ICD-10-CM | POA: Diagnosis not present

## 2020-11-15 DIAGNOSIS — E1151 Type 2 diabetes mellitus with diabetic peripheral angiopathy without gangrene: Secondary | ICD-10-CM | POA: Diagnosis not present

## 2020-11-15 DIAGNOSIS — L03116 Cellulitis of left lower limb: Secondary | ICD-10-CM | POA: Diagnosis not present

## 2020-11-15 DIAGNOSIS — Z794 Long term (current) use of insulin: Secondary | ICD-10-CM | POA: Diagnosis not present

## 2020-11-15 DIAGNOSIS — E1122 Type 2 diabetes mellitus with diabetic chronic kidney disease: Secondary | ICD-10-CM | POA: Diagnosis not present

## 2020-11-15 DIAGNOSIS — I2510 Atherosclerotic heart disease of native coronary artery without angina pectoris: Secondary | ICD-10-CM | POA: Diagnosis not present

## 2020-11-15 DIAGNOSIS — E785 Hyperlipidemia, unspecified: Secondary | ICD-10-CM | POA: Diagnosis not present

## 2020-11-15 DIAGNOSIS — Z89412 Acquired absence of left great toe: Secondary | ICD-10-CM | POA: Diagnosis not present

## 2020-11-15 DIAGNOSIS — Z792 Long term (current) use of antibiotics: Secondary | ICD-10-CM | POA: Diagnosis not present

## 2020-11-15 DIAGNOSIS — Z8631 Personal history of diabetic foot ulcer: Secondary | ICD-10-CM | POA: Diagnosis not present

## 2020-11-15 DIAGNOSIS — Z89411 Acquired absence of right great toe: Secondary | ICD-10-CM | POA: Diagnosis not present

## 2020-11-15 DIAGNOSIS — G8929 Other chronic pain: Secondary | ICD-10-CM | POA: Diagnosis not present

## 2020-11-25 DIAGNOSIS — K859 Acute pancreatitis without necrosis or infection, unspecified: Secondary | ICD-10-CM | POA: Diagnosis not present

## 2020-11-26 DIAGNOSIS — G8929 Other chronic pain: Secondary | ICD-10-CM | POA: Diagnosis not present

## 2020-11-26 DIAGNOSIS — M62830 Muscle spasm of back: Secondary | ICD-10-CM | POA: Diagnosis not present

## 2020-11-26 DIAGNOSIS — M5416 Radiculopathy, lumbar region: Secondary | ICD-10-CM | POA: Diagnosis not present

## 2020-11-26 DIAGNOSIS — Z79899 Other long term (current) drug therapy: Secondary | ICD-10-CM | POA: Diagnosis not present

## 2020-11-26 DIAGNOSIS — M25561 Pain in right knee: Secondary | ICD-10-CM | POA: Diagnosis not present

## 2020-12-04 DIAGNOSIS — Z0000 Encounter for general adult medical examination without abnormal findings: Secondary | ICD-10-CM | POA: Diagnosis not present

## 2020-12-04 DIAGNOSIS — I10 Essential (primary) hypertension: Secondary | ICD-10-CM | POA: Diagnosis not present

## 2020-12-04 DIAGNOSIS — H6121 Impacted cerumen, right ear: Secondary | ICD-10-CM | POA: Diagnosis not present

## 2020-12-04 DIAGNOSIS — R0602 Shortness of breath: Secondary | ICD-10-CM | POA: Diagnosis not present

## 2020-12-04 DIAGNOSIS — E559 Vitamin D deficiency, unspecified: Secondary | ICD-10-CM | POA: Diagnosis not present

## 2020-12-04 DIAGNOSIS — E7800 Pure hypercholesterolemia, unspecified: Secondary | ICD-10-CM | POA: Diagnosis not present

## 2020-12-04 DIAGNOSIS — Z9181 History of falling: Secondary | ICD-10-CM | POA: Diagnosis not present

## 2020-12-04 DIAGNOSIS — Z1159 Encounter for screening for other viral diseases: Secondary | ICD-10-CM | POA: Diagnosis not present

## 2020-12-04 DIAGNOSIS — H6123 Impacted cerumen, bilateral: Secondary | ICD-10-CM | POA: Diagnosis not present

## 2020-12-04 DIAGNOSIS — R5383 Other fatigue: Secondary | ICD-10-CM | POA: Diagnosis not present

## 2020-12-04 DIAGNOSIS — E119 Type 2 diabetes mellitus without complications: Secondary | ICD-10-CM | POA: Diagnosis not present

## 2020-12-04 DIAGNOSIS — Z79899 Other long term (current) drug therapy: Secondary | ICD-10-CM | POA: Diagnosis not present

## 2020-12-12 DIAGNOSIS — M25561 Pain in right knee: Secondary | ICD-10-CM | POA: Diagnosis not present

## 2020-12-12 DIAGNOSIS — E1142 Type 2 diabetes mellitus with diabetic polyneuropathy: Secondary | ICD-10-CM | POA: Diagnosis not present

## 2020-12-12 DIAGNOSIS — G8929 Other chronic pain: Secondary | ICD-10-CM | POA: Diagnosis not present

## 2020-12-12 DIAGNOSIS — M170 Bilateral primary osteoarthritis of knee: Secondary | ICD-10-CM | POA: Diagnosis not present

## 2020-12-12 DIAGNOSIS — M25562 Pain in left knee: Secondary | ICD-10-CM | POA: Diagnosis not present

## 2020-12-20 DIAGNOSIS — Z89431 Acquired absence of right foot: Secondary | ICD-10-CM | POA: Diagnosis not present

## 2020-12-20 DIAGNOSIS — Z89432 Acquired absence of left foot: Secondary | ICD-10-CM | POA: Diagnosis not present

## 2020-12-20 DIAGNOSIS — E1142 Type 2 diabetes mellitus with diabetic polyneuropathy: Secondary | ICD-10-CM | POA: Diagnosis not present

## 2020-12-20 DIAGNOSIS — L84 Corns and callosities: Secondary | ICD-10-CM | POA: Diagnosis not present

## 2020-12-25 DIAGNOSIS — K859 Acute pancreatitis without necrosis or infection, unspecified: Secondary | ICD-10-CM | POA: Diagnosis not present

## 2020-12-26 DIAGNOSIS — G8929 Other chronic pain: Secondary | ICD-10-CM | POA: Diagnosis not present

## 2020-12-26 DIAGNOSIS — E7800 Pure hypercholesterolemia, unspecified: Secondary | ICD-10-CM | POA: Diagnosis not present

## 2020-12-26 DIAGNOSIS — M545 Low back pain, unspecified: Secondary | ICD-10-CM | POA: Diagnosis not present

## 2020-12-26 DIAGNOSIS — I10 Essential (primary) hypertension: Secondary | ICD-10-CM | POA: Diagnosis not present

## 2020-12-26 DIAGNOSIS — Z79899 Other long term (current) drug therapy: Secondary | ICD-10-CM | POA: Diagnosis not present

## 2020-12-26 DIAGNOSIS — E119 Type 2 diabetes mellitus without complications: Secondary | ICD-10-CM | POA: Diagnosis not present

## 2020-12-30 DIAGNOSIS — Z79899 Other long term (current) drug therapy: Secondary | ICD-10-CM | POA: Diagnosis not present

## 2021-01-06 DIAGNOSIS — Z86711 Personal history of pulmonary embolism: Secondary | ICD-10-CM | POA: Diagnosis not present

## 2021-01-06 DIAGNOSIS — M1990 Unspecified osteoarthritis, unspecified site: Secondary | ICD-10-CM | POA: Diagnosis not present

## 2021-01-06 DIAGNOSIS — E1151 Type 2 diabetes mellitus with diabetic peripheral angiopathy without gangrene: Secondary | ICD-10-CM | POA: Diagnosis not present

## 2021-01-06 DIAGNOSIS — E1169 Type 2 diabetes mellitus with other specified complication: Secondary | ICD-10-CM | POA: Diagnosis not present

## 2021-01-06 DIAGNOSIS — Z79899 Other long term (current) drug therapy: Secondary | ICD-10-CM | POA: Diagnosis not present

## 2021-01-06 DIAGNOSIS — R079 Chest pain, unspecified: Secondary | ICD-10-CM | POA: Diagnosis not present

## 2021-01-06 DIAGNOSIS — I2510 Atherosclerotic heart disease of native coronary artery without angina pectoris: Secondary | ICD-10-CM | POA: Diagnosis not present

## 2021-01-06 DIAGNOSIS — R112 Nausea with vomiting, unspecified: Secondary | ICD-10-CM | POA: Diagnosis not present

## 2021-01-06 DIAGNOSIS — F17210 Nicotine dependence, cigarettes, uncomplicated: Secondary | ICD-10-CM | POA: Diagnosis not present

## 2021-01-06 DIAGNOSIS — I480 Paroxysmal atrial fibrillation: Secondary | ICD-10-CM | POA: Diagnosis not present

## 2021-01-06 DIAGNOSIS — R0789 Other chest pain: Secondary | ICD-10-CM | POA: Diagnosis not present

## 2021-01-06 DIAGNOSIS — I10 Essential (primary) hypertension: Secondary | ICD-10-CM | POA: Diagnosis not present

## 2021-01-06 DIAGNOSIS — Z794 Long term (current) use of insulin: Secondary | ICD-10-CM | POA: Diagnosis not present

## 2021-01-06 DIAGNOSIS — E785 Hyperlipidemia, unspecified: Secondary | ICD-10-CM | POA: Diagnosis not present

## 2021-01-14 DIAGNOSIS — Z89422 Acquired absence of other left toe(s): Secondary | ICD-10-CM | POA: Diagnosis not present

## 2021-01-14 DIAGNOSIS — Z89431 Acquired absence of right foot: Secondary | ICD-10-CM | POA: Diagnosis not present

## 2021-01-14 DIAGNOSIS — Z89412 Acquired absence of left great toe: Secondary | ICD-10-CM | POA: Diagnosis not present

## 2021-01-14 DIAGNOSIS — E1142 Type 2 diabetes mellitus with diabetic polyneuropathy: Secondary | ICD-10-CM | POA: Diagnosis not present

## 2021-01-14 DIAGNOSIS — E11621 Type 2 diabetes mellitus with foot ulcer: Secondary | ICD-10-CM | POA: Diagnosis not present

## 2021-01-14 DIAGNOSIS — L97422 Non-pressure chronic ulcer of left heel and midfoot with fat layer exposed: Secondary | ICD-10-CM | POA: Diagnosis not present

## 2021-01-15 DIAGNOSIS — I2510 Atherosclerotic heart disease of native coronary artery without angina pectoris: Secondary | ICD-10-CM | POA: Diagnosis not present

## 2021-01-15 DIAGNOSIS — I4891 Unspecified atrial fibrillation: Secondary | ICD-10-CM | POA: Diagnosis not present

## 2021-01-15 DIAGNOSIS — R0602 Shortness of breath: Secondary | ICD-10-CM | POA: Diagnosis not present

## 2021-01-15 DIAGNOSIS — R9431 Abnormal electrocardiogram [ECG] [EKG]: Secondary | ICD-10-CM | POA: Diagnosis not present

## 2021-01-25 DIAGNOSIS — E119 Type 2 diabetes mellitus without complications: Secondary | ICD-10-CM | POA: Diagnosis not present

## 2021-01-25 DIAGNOSIS — M25561 Pain in right knee: Secondary | ICD-10-CM | POA: Diagnosis not present

## 2021-01-25 DIAGNOSIS — G8929 Other chronic pain: Secondary | ICD-10-CM | POA: Diagnosis not present

## 2021-01-25 DIAGNOSIS — M5416 Radiculopathy, lumbar region: Secondary | ICD-10-CM | POA: Diagnosis not present

## 2021-01-25 DIAGNOSIS — M25562 Pain in left knee: Secondary | ICD-10-CM | POA: Diagnosis not present

## 2021-01-25 DIAGNOSIS — K859 Acute pancreatitis without necrosis or infection, unspecified: Secondary | ICD-10-CM | POA: Diagnosis not present

## 2021-01-25 DIAGNOSIS — Z79899 Other long term (current) drug therapy: Secondary | ICD-10-CM | POA: Diagnosis not present

## 2021-01-25 DIAGNOSIS — R030 Elevated blood-pressure reading, without diagnosis of hypertension: Secondary | ICD-10-CM | POA: Diagnosis not present

## 2021-01-29 DIAGNOSIS — Z79899 Other long term (current) drug therapy: Secondary | ICD-10-CM | POA: Diagnosis not present

## 2021-02-19 DIAGNOSIS — L02612 Cutaneous abscess of left foot: Secondary | ICD-10-CM | POA: Diagnosis not present

## 2021-02-19 DIAGNOSIS — Z89412 Acquired absence of left great toe: Secondary | ICD-10-CM | POA: Diagnosis not present

## 2021-02-19 DIAGNOSIS — E1142 Type 2 diabetes mellitus with diabetic polyneuropathy: Secondary | ICD-10-CM | POA: Diagnosis not present

## 2021-02-19 DIAGNOSIS — Z8631 Personal history of diabetic foot ulcer: Secondary | ICD-10-CM | POA: Diagnosis not present

## 2021-02-19 DIAGNOSIS — Z89431 Acquired absence of right foot: Secondary | ICD-10-CM | POA: Diagnosis not present

## 2021-02-25 DIAGNOSIS — K859 Acute pancreatitis without necrosis or infection, unspecified: Secondary | ICD-10-CM | POA: Diagnosis not present

## 2021-02-27 DIAGNOSIS — G8929 Other chronic pain: Secondary | ICD-10-CM | POA: Diagnosis not present

## 2021-02-27 DIAGNOSIS — M5416 Radiculopathy, lumbar region: Secondary | ICD-10-CM | POA: Diagnosis not present

## 2021-02-27 DIAGNOSIS — Z79899 Other long term (current) drug therapy: Secondary | ICD-10-CM | POA: Diagnosis not present

## 2021-02-27 DIAGNOSIS — E119 Type 2 diabetes mellitus without complications: Secondary | ICD-10-CM | POA: Diagnosis not present

## 2021-02-28 DIAGNOSIS — Z86711 Personal history of pulmonary embolism: Secondary | ICD-10-CM | POA: Diagnosis not present

## 2021-02-28 DIAGNOSIS — I10 Essential (primary) hypertension: Secondary | ICD-10-CM | POA: Diagnosis not present

## 2021-02-28 DIAGNOSIS — E1169 Type 2 diabetes mellitus with other specified complication: Secondary | ICD-10-CM | POA: Diagnosis not present

## 2021-02-28 DIAGNOSIS — F17210 Nicotine dependence, cigarettes, uncomplicated: Secondary | ICD-10-CM | POA: Diagnosis not present

## 2021-02-28 DIAGNOSIS — R296 Repeated falls: Secondary | ICD-10-CM | POA: Diagnosis not present

## 2021-02-28 DIAGNOSIS — Z794 Long term (current) use of insulin: Secondary | ICD-10-CM | POA: Diagnosis not present

## 2021-02-28 DIAGNOSIS — Z885 Allergy status to narcotic agent status: Secondary | ICD-10-CM | POA: Diagnosis not present

## 2021-02-28 DIAGNOSIS — E785 Hyperlipidemia, unspecified: Secondary | ICD-10-CM | POA: Diagnosis not present

## 2021-02-28 DIAGNOSIS — E1151 Type 2 diabetes mellitus with diabetic peripheral angiopathy without gangrene: Secondary | ICD-10-CM | POA: Diagnosis not present

## 2021-02-28 DIAGNOSIS — I2510 Atherosclerotic heart disease of native coronary artery without angina pectoris: Secondary | ICD-10-CM | POA: Diagnosis not present

## 2021-02-28 DIAGNOSIS — R2241 Localized swelling, mass and lump, right lower limb: Secondary | ICD-10-CM | POA: Diagnosis not present

## 2021-02-28 DIAGNOSIS — M1990 Unspecified osteoarthritis, unspecified site: Secondary | ICD-10-CM | POA: Diagnosis not present

## 2021-02-28 DIAGNOSIS — R55 Syncope and collapse: Secondary | ICD-10-CM | POA: Diagnosis not present

## 2021-02-28 DIAGNOSIS — Z79899 Other long term (current) drug therapy: Secondary | ICD-10-CM | POA: Diagnosis not present

## 2021-02-28 DIAGNOSIS — Z7901 Long term (current) use of anticoagulants: Secondary | ICD-10-CM | POA: Diagnosis not present

## 2021-02-28 DIAGNOSIS — I480 Paroxysmal atrial fibrillation: Secondary | ICD-10-CM | POA: Diagnosis not present

## 2021-02-28 DIAGNOSIS — R600 Localized edema: Secondary | ICD-10-CM | POA: Diagnosis not present

## 2021-03-01 DIAGNOSIS — Z79899 Other long term (current) drug therapy: Secondary | ICD-10-CM | POA: Diagnosis not present

## 2021-03-05 DIAGNOSIS — Z7409 Other reduced mobility: Secondary | ICD-10-CM | POA: Diagnosis not present

## 2021-03-14 DIAGNOSIS — Z89412 Acquired absence of left great toe: Secondary | ICD-10-CM | POA: Diagnosis not present

## 2021-03-14 DIAGNOSIS — E1142 Type 2 diabetes mellitus with diabetic polyneuropathy: Secondary | ICD-10-CM | POA: Diagnosis not present

## 2021-03-14 DIAGNOSIS — E11621 Type 2 diabetes mellitus with foot ulcer: Secondary | ICD-10-CM | POA: Diagnosis not present

## 2021-03-14 DIAGNOSIS — L97422 Non-pressure chronic ulcer of left heel and midfoot with fat layer exposed: Secondary | ICD-10-CM | POA: Diagnosis not present

## 2021-03-14 DIAGNOSIS — Z89431 Acquired absence of right foot: Secondary | ICD-10-CM | POA: Diagnosis not present

## 2021-03-25 DIAGNOSIS — R52 Pain, unspecified: Secondary | ICD-10-CM | POA: Diagnosis not present

## 2021-03-26 DIAGNOSIS — R7309 Other abnormal glucose: Secondary | ICD-10-CM | POA: Diagnosis not present

## 2021-03-26 DIAGNOSIS — R52 Pain, unspecified: Secondary | ICD-10-CM | POA: Diagnosis not present

## 2021-03-26 DIAGNOSIS — M545 Low back pain, unspecified: Secondary | ICD-10-CM | POA: Diagnosis not present

## 2021-03-26 DIAGNOSIS — M5441 Lumbago with sciatica, right side: Secondary | ICD-10-CM | POA: Diagnosis not present

## 2021-03-27 DIAGNOSIS — K859 Acute pancreatitis without necrosis or infection, unspecified: Secondary | ICD-10-CM | POA: Diagnosis not present

## 2021-03-29 DIAGNOSIS — Z79899 Other long term (current) drug therapy: Secondary | ICD-10-CM | POA: Diagnosis not present

## 2021-03-29 DIAGNOSIS — G8929 Other chronic pain: Secondary | ICD-10-CM | POA: Diagnosis not present

## 2021-03-29 DIAGNOSIS — M5416 Radiculopathy, lumbar region: Secondary | ICD-10-CM | POA: Diagnosis not present

## 2021-03-29 DIAGNOSIS — M62830 Muscle spasm of back: Secondary | ICD-10-CM | POA: Diagnosis not present

## 2021-03-29 DIAGNOSIS — E119 Type 2 diabetes mellitus without complications: Secondary | ICD-10-CM | POA: Diagnosis not present

## 2021-04-01 DIAGNOSIS — Z79899 Other long term (current) drug therapy: Secondary | ICD-10-CM | POA: Diagnosis not present

## 2021-04-04 DIAGNOSIS — M5441 Lumbago with sciatica, right side: Secondary | ICD-10-CM | POA: Diagnosis not present

## 2021-04-04 DIAGNOSIS — M79604 Pain in right leg: Secondary | ICD-10-CM | POA: Diagnosis not present

## 2021-04-04 DIAGNOSIS — G8929 Other chronic pain: Secondary | ICD-10-CM | POA: Diagnosis not present

## 2021-04-04 DIAGNOSIS — M79661 Pain in right lower leg: Secondary | ICD-10-CM | POA: Diagnosis not present

## 2021-04-05 DIAGNOSIS — E1151 Type 2 diabetes mellitus with diabetic peripheral angiopathy without gangrene: Secondary | ICD-10-CM | POA: Diagnosis not present

## 2021-04-05 DIAGNOSIS — Z794 Long term (current) use of insulin: Secondary | ICD-10-CM | POA: Diagnosis not present

## 2021-04-05 DIAGNOSIS — G8911 Acute pain due to trauma: Secondary | ICD-10-CM | POA: Diagnosis not present

## 2021-04-05 DIAGNOSIS — I2510 Atherosclerotic heart disease of native coronary artery without angina pectoris: Secondary | ICD-10-CM | POA: Diagnosis not present

## 2021-04-05 DIAGNOSIS — L97509 Non-pressure chronic ulcer of other part of unspecified foot with unspecified severity: Secondary | ICD-10-CM | POA: Diagnosis not present

## 2021-04-05 DIAGNOSIS — E1122 Type 2 diabetes mellitus with diabetic chronic kidney disease: Secondary | ICD-10-CM | POA: Diagnosis not present

## 2021-04-05 DIAGNOSIS — G2581 Restless legs syndrome: Secondary | ICD-10-CM | POA: Diagnosis not present

## 2021-04-05 DIAGNOSIS — I517 Cardiomegaly: Secondary | ICD-10-CM | POA: Diagnosis not present

## 2021-04-05 DIAGNOSIS — I482 Chronic atrial fibrillation, unspecified: Secondary | ICD-10-CM | POA: Diagnosis not present

## 2021-04-05 DIAGNOSIS — M1711 Unilateral primary osteoarthritis, right knee: Secondary | ICD-10-CM | POA: Diagnosis not present

## 2021-04-05 DIAGNOSIS — F17210 Nicotine dependence, cigarettes, uncomplicated: Secondary | ICD-10-CM | POA: Diagnosis not present

## 2021-04-05 DIAGNOSIS — I129 Hypertensive chronic kidney disease with stage 1 through stage 4 chronic kidney disease, or unspecified chronic kidney disease: Secondary | ICD-10-CM | POA: Diagnosis not present

## 2021-04-05 DIAGNOSIS — D631 Anemia in chronic kidney disease: Secondary | ICD-10-CM | POA: Diagnosis not present

## 2021-04-05 DIAGNOSIS — R930 Abnormal findings on diagnostic imaging of skull and head, not elsewhere classified: Secondary | ICD-10-CM | POA: Diagnosis not present

## 2021-04-05 DIAGNOSIS — M5431 Sciatica, right side: Secondary | ICD-10-CM | POA: Diagnosis not present

## 2021-04-05 DIAGNOSIS — I081 Rheumatic disorders of both mitral and tricuspid valves: Secondary | ICD-10-CM | POA: Diagnosis not present

## 2021-04-05 DIAGNOSIS — N1831 Chronic kidney disease, stage 3a: Secondary | ICD-10-CM | POA: Diagnosis not present

## 2021-04-05 DIAGNOSIS — E11621 Type 2 diabetes mellitus with foot ulcer: Secondary | ICD-10-CM | POA: Diagnosis not present

## 2021-04-05 DIAGNOSIS — M5116 Intervertebral disc disorders with radiculopathy, lumbar region: Secondary | ICD-10-CM | POA: Diagnosis not present

## 2021-04-05 DIAGNOSIS — R296 Repeated falls: Secondary | ICD-10-CM | POA: Diagnosis not present

## 2021-04-05 DIAGNOSIS — E871 Hypo-osmolality and hyponatremia: Secondary | ICD-10-CM | POA: Diagnosis not present

## 2021-04-05 DIAGNOSIS — G919 Hydrocephalus, unspecified: Secondary | ICD-10-CM | POA: Diagnosis not present

## 2021-04-05 DIAGNOSIS — I4892 Unspecified atrial flutter: Secondary | ICD-10-CM | POA: Diagnosis not present

## 2021-04-05 DIAGNOSIS — E785 Hyperlipidemia, unspecified: Secondary | ICD-10-CM | POA: Diagnosis not present

## 2021-04-05 DIAGNOSIS — K5790 Diverticulosis of intestine, part unspecified, without perforation or abscess without bleeding: Secondary | ICD-10-CM | POA: Diagnosis not present

## 2021-04-05 DIAGNOSIS — W19XXXA Unspecified fall, initial encounter: Secondary | ICD-10-CM | POA: Diagnosis not present

## 2021-04-05 DIAGNOSIS — M19071 Primary osteoarthritis, right ankle and foot: Secondary | ICD-10-CM | POA: Diagnosis not present

## 2021-04-05 DIAGNOSIS — M5136 Other intervertebral disc degeneration, lumbar region: Secondary | ICD-10-CM | POA: Diagnosis not present

## 2021-04-05 DIAGNOSIS — I4891 Unspecified atrial fibrillation: Secondary | ICD-10-CM | POA: Diagnosis not present

## 2021-04-05 DIAGNOSIS — E119 Type 2 diabetes mellitus without complications: Secondary | ICD-10-CM | POA: Diagnosis not present

## 2021-04-05 DIAGNOSIS — N1832 Chronic kidney disease, stage 3b: Secondary | ICD-10-CM | POA: Diagnosis not present

## 2021-04-05 DIAGNOSIS — M79604 Pain in right leg: Secondary | ICD-10-CM | POA: Diagnosis not present

## 2021-04-05 DIAGNOSIS — I739 Peripheral vascular disease, unspecified: Secondary | ICD-10-CM | POA: Diagnosis not present

## 2021-04-05 DIAGNOSIS — I480 Paroxysmal atrial fibrillation: Secondary | ICD-10-CM | POA: Diagnosis not present

## 2021-04-05 DIAGNOSIS — E8889 Other specified metabolic disorders: Secondary | ICD-10-CM | POA: Diagnosis not present

## 2021-04-15 DIAGNOSIS — Z89431 Acquired absence of right foot: Secondary | ICD-10-CM | POA: Diagnosis not present

## 2021-04-15 DIAGNOSIS — I2510 Atherosclerotic heart disease of native coronary artery without angina pectoris: Secondary | ICD-10-CM | POA: Diagnosis not present

## 2021-04-15 DIAGNOSIS — Z9181 History of falling: Secondary | ICD-10-CM | POA: Diagnosis not present

## 2021-04-15 DIAGNOSIS — M545 Low back pain, unspecified: Secondary | ICD-10-CM | POA: Diagnosis not present

## 2021-04-25 DIAGNOSIS — M5416 Radiculopathy, lumbar region: Secondary | ICD-10-CM | POA: Diagnosis not present

## 2021-04-25 DIAGNOSIS — E119 Type 2 diabetes mellitus without complications: Secondary | ICD-10-CM | POA: Diagnosis not present

## 2021-04-25 DIAGNOSIS — E559 Vitamin D deficiency, unspecified: Secondary | ICD-10-CM | POA: Diagnosis not present

## 2021-04-25 DIAGNOSIS — G8929 Other chronic pain: Secondary | ICD-10-CM | POA: Diagnosis not present

## 2021-04-25 DIAGNOSIS — Z23 Encounter for immunization: Secondary | ICD-10-CM | POA: Diagnosis not present

## 2021-04-25 DIAGNOSIS — Z79899 Other long term (current) drug therapy: Secondary | ICD-10-CM | POA: Diagnosis not present

## 2021-04-25 DIAGNOSIS — R7989 Other specified abnormal findings of blood chemistry: Secondary | ICD-10-CM | POA: Diagnosis not present

## 2021-04-25 DIAGNOSIS — E7800 Pure hypercholesterolemia, unspecified: Secondary | ICD-10-CM | POA: Diagnosis not present

## 2021-04-25 DIAGNOSIS — M62830 Muscle spasm of back: Secondary | ICD-10-CM | POA: Diagnosis not present

## 2021-04-26 DIAGNOSIS — M47816 Spondylosis without myelopathy or radiculopathy, lumbar region: Secondary | ICD-10-CM | POA: Diagnosis not present

## 2021-04-26 DIAGNOSIS — M5440 Lumbago with sciatica, unspecified side: Secondary | ICD-10-CM | POA: Diagnosis not present

## 2021-04-27 DIAGNOSIS — K859 Acute pancreatitis without necrosis or infection, unspecified: Secondary | ICD-10-CM | POA: Diagnosis not present

## 2021-05-16 DIAGNOSIS — Z89431 Acquired absence of right foot: Secondary | ICD-10-CM | POA: Diagnosis not present

## 2021-05-16 DIAGNOSIS — Z9181 History of falling: Secondary | ICD-10-CM | POA: Diagnosis not present

## 2021-05-16 DIAGNOSIS — M545 Low back pain, unspecified: Secondary | ICD-10-CM | POA: Diagnosis not present

## 2021-05-16 DIAGNOSIS — I2510 Atherosclerotic heart disease of native coronary artery without angina pectoris: Secondary | ICD-10-CM | POA: Diagnosis not present

## 2021-05-23 DIAGNOSIS — M546 Pain in thoracic spine: Secondary | ICD-10-CM | POA: Diagnosis not present

## 2021-05-23 DIAGNOSIS — R030 Elevated blood-pressure reading, without diagnosis of hypertension: Secondary | ICD-10-CM | POA: Diagnosis not present

## 2021-05-23 DIAGNOSIS — M5416 Radiculopathy, lumbar region: Secondary | ICD-10-CM | POA: Diagnosis not present

## 2021-05-23 DIAGNOSIS — G8929 Other chronic pain: Secondary | ICD-10-CM | POA: Diagnosis not present

## 2021-05-23 DIAGNOSIS — E119 Type 2 diabetes mellitus without complications: Secondary | ICD-10-CM | POA: Diagnosis not present

## 2021-05-23 DIAGNOSIS — M62830 Muscle spasm of back: Secondary | ICD-10-CM | POA: Diagnosis not present

## 2021-05-23 DIAGNOSIS — Z79899 Other long term (current) drug therapy: Secondary | ICD-10-CM | POA: Diagnosis not present

## 2021-05-27 DIAGNOSIS — K859 Acute pancreatitis without necrosis or infection, unspecified: Secondary | ICD-10-CM | POA: Diagnosis not present

## 2021-05-28 DIAGNOSIS — K529 Noninfective gastroenteritis and colitis, unspecified: Secondary | ICD-10-CM | POA: Diagnosis not present

## 2021-05-28 DIAGNOSIS — K5730 Diverticulosis of large intestine without perforation or abscess without bleeding: Secondary | ICD-10-CM | POA: Diagnosis not present

## 2021-05-28 DIAGNOSIS — R531 Weakness: Secondary | ICD-10-CM | POA: Diagnosis not present

## 2021-05-28 DIAGNOSIS — N3289 Other specified disorders of bladder: Secondary | ICD-10-CM | POA: Diagnosis not present

## 2021-05-28 DIAGNOSIS — I2510 Atherosclerotic heart disease of native coronary artery without angina pectoris: Secondary | ICD-10-CM | POA: Diagnosis not present

## 2021-05-28 DIAGNOSIS — Z7901 Long term (current) use of anticoagulants: Secondary | ICD-10-CM | POA: Diagnosis not present

## 2021-05-28 DIAGNOSIS — N183 Chronic kidney disease, stage 3 unspecified: Secondary | ICD-10-CM | POA: Diagnosis not present

## 2021-05-28 DIAGNOSIS — R109 Unspecified abdominal pain: Secondary | ICD-10-CM | POA: Diagnosis not present

## 2021-05-28 DIAGNOSIS — E1122 Type 2 diabetes mellitus with diabetic chronic kidney disease: Secondary | ICD-10-CM | POA: Diagnosis not present

## 2021-05-28 DIAGNOSIS — I129 Hypertensive chronic kidney disease with stage 1 through stage 4 chronic kidney disease, or unspecified chronic kidney disease: Secondary | ICD-10-CM | POA: Diagnosis not present

## 2021-05-28 DIAGNOSIS — K449 Diaphragmatic hernia without obstruction or gangrene: Secondary | ICD-10-CM | POA: Diagnosis not present

## 2021-05-28 DIAGNOSIS — E785 Hyperlipidemia, unspecified: Secondary | ICD-10-CM | POA: Diagnosis not present

## 2021-05-28 DIAGNOSIS — Z87891 Personal history of nicotine dependence: Secondary | ICD-10-CM | POA: Diagnosis not present

## 2021-05-28 DIAGNOSIS — Z79899 Other long term (current) drug therapy: Secondary | ICD-10-CM | POA: Diagnosis not present

## 2021-05-28 DIAGNOSIS — Z7982 Long term (current) use of aspirin: Secondary | ICD-10-CM | POA: Diagnosis not present

## 2021-05-28 DIAGNOSIS — I739 Peripheral vascular disease, unspecified: Secondary | ICD-10-CM | POA: Diagnosis not present

## 2021-05-28 DIAGNOSIS — Z794 Long term (current) use of insulin: Secondary | ICD-10-CM | POA: Diagnosis not present

## 2021-05-28 DIAGNOSIS — R112 Nausea with vomiting, unspecified: Secondary | ICD-10-CM | POA: Diagnosis not present

## 2021-05-28 DIAGNOSIS — E871 Hypo-osmolality and hyponatremia: Secondary | ICD-10-CM | POA: Diagnosis not present

## 2021-05-28 DIAGNOSIS — I480 Paroxysmal atrial fibrillation: Secondary | ICD-10-CM | POA: Diagnosis not present

## 2021-05-28 DIAGNOSIS — N179 Acute kidney failure, unspecified: Secondary | ICD-10-CM | POA: Diagnosis not present

## 2021-05-28 DIAGNOSIS — Z20822 Contact with and (suspected) exposure to covid-19: Secondary | ICD-10-CM | POA: Diagnosis not present

## 2021-05-29 DIAGNOSIS — E785 Hyperlipidemia, unspecified: Secondary | ICD-10-CM | POA: Diagnosis not present

## 2021-05-29 DIAGNOSIS — E1165 Type 2 diabetes mellitus with hyperglycemia: Secondary | ICD-10-CM | POA: Diagnosis not present

## 2021-05-29 DIAGNOSIS — E1122 Type 2 diabetes mellitus with diabetic chronic kidney disease: Secondary | ICD-10-CM | POA: Diagnosis not present

## 2021-05-29 DIAGNOSIS — R109 Unspecified abdominal pain: Secondary | ICD-10-CM | POA: Diagnosis not present

## 2021-05-29 DIAGNOSIS — K5730 Diverticulosis of large intestine without perforation or abscess without bleeding: Secondary | ICD-10-CM | POA: Diagnosis not present

## 2021-05-29 DIAGNOSIS — I739 Peripheral vascular disease, unspecified: Secondary | ICD-10-CM | POA: Diagnosis not present

## 2021-05-29 DIAGNOSIS — L97529 Non-pressure chronic ulcer of other part of left foot with unspecified severity: Secondary | ICD-10-CM | POA: Diagnosis not present

## 2021-05-29 DIAGNOSIS — N1831 Chronic kidney disease, stage 3a: Secondary | ICD-10-CM | POA: Diagnosis not present

## 2021-05-29 DIAGNOSIS — N183 Chronic kidney disease, stage 3 unspecified: Secondary | ICD-10-CM | POA: Diagnosis not present

## 2021-05-29 DIAGNOSIS — N3289 Other specified disorders of bladder: Secondary | ICD-10-CM | POA: Diagnosis not present

## 2021-05-29 DIAGNOSIS — K529 Noninfective gastroenteritis and colitis, unspecified: Secondary | ICD-10-CM | POA: Diagnosis not present

## 2021-05-29 DIAGNOSIS — Z87891 Personal history of nicotine dependence: Secondary | ICD-10-CM | POA: Diagnosis not present

## 2021-05-29 DIAGNOSIS — G8929 Other chronic pain: Secondary | ICD-10-CM | POA: Diagnosis not present

## 2021-05-29 DIAGNOSIS — M5440 Lumbago with sciatica, unspecified side: Secondary | ICD-10-CM | POA: Diagnosis not present

## 2021-05-29 DIAGNOSIS — K449 Diaphragmatic hernia without obstruction or gangrene: Secondary | ICD-10-CM | POA: Diagnosis not present

## 2021-05-29 DIAGNOSIS — E11621 Type 2 diabetes mellitus with foot ulcer: Secondary | ICD-10-CM | POA: Diagnosis not present

## 2021-05-29 DIAGNOSIS — I2510 Atherosclerotic heart disease of native coronary artery without angina pectoris: Secondary | ICD-10-CM | POA: Diagnosis not present

## 2021-05-29 DIAGNOSIS — I159 Secondary hypertension, unspecified: Secondary | ICD-10-CM | POA: Diagnosis not present

## 2021-05-29 DIAGNOSIS — E7849 Other hyperlipidemia: Secondary | ICD-10-CM | POA: Diagnosis not present

## 2021-05-29 DIAGNOSIS — E871 Hypo-osmolality and hyponatremia: Secondary | ICD-10-CM | POA: Diagnosis not present

## 2021-05-29 DIAGNOSIS — I129 Hypertensive chronic kidney disease with stage 1 through stage 4 chronic kidney disease, or unspecified chronic kidney disease: Secondary | ICD-10-CM | POA: Diagnosis not present

## 2021-05-29 DIAGNOSIS — I480 Paroxysmal atrial fibrillation: Secondary | ICD-10-CM | POA: Diagnosis not present

## 2021-05-29 DIAGNOSIS — R112 Nausea with vomiting, unspecified: Secondary | ICD-10-CM | POA: Diagnosis not present

## 2021-05-29 DIAGNOSIS — Z794 Long term (current) use of insulin: Secondary | ICD-10-CM | POA: Diagnosis not present

## 2021-05-30 DIAGNOSIS — R112 Nausea with vomiting, unspecified: Secondary | ICD-10-CM | POA: Diagnosis not present

## 2021-06-07 DIAGNOSIS — M545 Low back pain, unspecified: Secondary | ICD-10-CM | POA: Diagnosis not present

## 2021-06-07 DIAGNOSIS — G8929 Other chronic pain: Secondary | ICD-10-CM | POA: Diagnosis not present

## 2021-06-07 DIAGNOSIS — Z7901 Long term (current) use of anticoagulants: Secondary | ICD-10-CM | POA: Diagnosis not present

## 2021-06-07 DIAGNOSIS — M5116 Intervertebral disc disorders with radiculopathy, lumbar region: Secondary | ICD-10-CM | POA: Diagnosis not present

## 2021-06-07 DIAGNOSIS — M5416 Radiculopathy, lumbar region: Secondary | ICD-10-CM | POA: Diagnosis not present

## 2021-06-07 DIAGNOSIS — I4891 Unspecified atrial fibrillation: Secondary | ICD-10-CM | POA: Diagnosis not present

## 2021-06-07 DIAGNOSIS — M48061 Spinal stenosis, lumbar region without neurogenic claudication: Secondary | ICD-10-CM | POA: Diagnosis not present

## 2021-06-07 DIAGNOSIS — M542 Cervicalgia: Secondary | ICD-10-CM | POA: Diagnosis not present

## 2021-06-15 DIAGNOSIS — Z89431 Acquired absence of right foot: Secondary | ICD-10-CM | POA: Diagnosis not present

## 2021-06-15 DIAGNOSIS — I2510 Atherosclerotic heart disease of native coronary artery without angina pectoris: Secondary | ICD-10-CM | POA: Diagnosis not present

## 2021-06-15 DIAGNOSIS — M545 Low back pain, unspecified: Secondary | ICD-10-CM | POA: Diagnosis not present

## 2021-06-15 DIAGNOSIS — Z9181 History of falling: Secondary | ICD-10-CM | POA: Diagnosis not present

## 2021-06-18 DIAGNOSIS — E119 Type 2 diabetes mellitus without complications: Secondary | ICD-10-CM | POA: Diagnosis not present

## 2021-06-20 DIAGNOSIS — E119 Type 2 diabetes mellitus without complications: Secondary | ICD-10-CM | POA: Diagnosis not present

## 2021-06-20 DIAGNOSIS — E1142 Type 2 diabetes mellitus with diabetic polyneuropathy: Secondary | ICD-10-CM | POA: Diagnosis not present

## 2021-06-20 DIAGNOSIS — G8929 Other chronic pain: Secondary | ICD-10-CM | POA: Diagnosis not present

## 2021-06-20 DIAGNOSIS — E11621 Type 2 diabetes mellitus with foot ulcer: Secondary | ICD-10-CM | POA: Diagnosis not present

## 2021-06-20 DIAGNOSIS — L97422 Non-pressure chronic ulcer of left heel and midfoot with fat layer exposed: Secondary | ICD-10-CM | POA: Diagnosis not present

## 2021-06-20 DIAGNOSIS — Z89431 Acquired absence of right foot: Secondary | ICD-10-CM | POA: Diagnosis not present

## 2021-06-20 DIAGNOSIS — Z79899 Other long term (current) drug therapy: Secondary | ICD-10-CM | POA: Diagnosis not present

## 2021-06-20 DIAGNOSIS — M5416 Radiculopathy, lumbar region: Secondary | ICD-10-CM | POA: Diagnosis not present

## 2021-06-20 DIAGNOSIS — M62830 Muscle spasm of back: Secondary | ICD-10-CM | POA: Diagnosis not present

## 2021-06-26 DIAGNOSIS — Z79899 Other long term (current) drug therapy: Secondary | ICD-10-CM | POA: Diagnosis not present

## 2021-06-27 DIAGNOSIS — M47816 Spondylosis without myelopathy or radiculopathy, lumbar region: Secondary | ICD-10-CM | POA: Diagnosis not present

## 2021-06-27 DIAGNOSIS — M5416 Radiculopathy, lumbar region: Secondary | ICD-10-CM | POA: Diagnosis not present

## 2021-06-27 DIAGNOSIS — G8929 Other chronic pain: Secondary | ICD-10-CM | POA: Diagnosis not present

## 2021-06-27 DIAGNOSIS — Z79899 Other long term (current) drug therapy: Secondary | ICD-10-CM | POA: Diagnosis not present

## 2021-06-27 DIAGNOSIS — Z9889 Other specified postprocedural states: Secondary | ICD-10-CM | POA: Diagnosis not present

## 2021-06-27 DIAGNOSIS — E1165 Type 2 diabetes mellitus with hyperglycemia: Secondary | ICD-10-CM | POA: Diagnosis not present

## 2021-06-27 DIAGNOSIS — M25569 Pain in unspecified knee: Secondary | ICD-10-CM | POA: Diagnosis not present

## 2021-06-27 DIAGNOSIS — M542 Cervicalgia: Secondary | ICD-10-CM | POA: Diagnosis not present

## 2021-06-27 DIAGNOSIS — M4726 Other spondylosis with radiculopathy, lumbar region: Secondary | ICD-10-CM | POA: Diagnosis not present

## 2021-06-27 DIAGNOSIS — E118 Type 2 diabetes mellitus with unspecified complications: Secondary | ICD-10-CM | POA: Diagnosis not present

## 2021-07-09 DIAGNOSIS — I2510 Atherosclerotic heart disease of native coronary artery without angina pectoris: Secondary | ICD-10-CM | POA: Diagnosis not present

## 2021-07-09 DIAGNOSIS — I2699 Other pulmonary embolism without acute cor pulmonale: Secondary | ICD-10-CM | POA: Diagnosis not present

## 2021-07-09 DIAGNOSIS — E11628 Type 2 diabetes mellitus with other skin complications: Secondary | ICD-10-CM | POA: Diagnosis not present

## 2021-07-09 DIAGNOSIS — E1169 Type 2 diabetes mellitus with other specified complication: Secondary | ICD-10-CM | POA: Diagnosis not present

## 2021-07-09 DIAGNOSIS — N183 Chronic kidney disease, stage 3 unspecified: Secondary | ICD-10-CM | POA: Diagnosis not present

## 2021-07-09 DIAGNOSIS — I129 Hypertensive chronic kidney disease with stage 1 through stage 4 chronic kidney disease, or unspecified chronic kidney disease: Secondary | ICD-10-CM | POA: Diagnosis not present

## 2021-07-09 DIAGNOSIS — M170 Bilateral primary osteoarthritis of knee: Secondary | ICD-10-CM | POA: Diagnosis not present

## 2021-07-09 DIAGNOSIS — K5792 Diverticulitis of intestine, part unspecified, without perforation or abscess without bleeding: Secondary | ICD-10-CM | POA: Diagnosis not present

## 2021-07-09 DIAGNOSIS — E1122 Type 2 diabetes mellitus with diabetic chronic kidney disease: Secondary | ICD-10-CM | POA: Diagnosis not present

## 2021-07-09 DIAGNOSIS — M5430 Sciatica, unspecified side: Secondary | ICD-10-CM | POA: Diagnosis not present

## 2021-07-15 DIAGNOSIS — E119 Type 2 diabetes mellitus without complications: Secondary | ICD-10-CM | POA: Diagnosis not present

## 2021-07-15 DIAGNOSIS — M5416 Radiculopathy, lumbar region: Secondary | ICD-10-CM | POA: Diagnosis not present

## 2021-07-15 DIAGNOSIS — G8929 Other chronic pain: Secondary | ICD-10-CM | POA: Diagnosis not present

## 2021-07-15 DIAGNOSIS — Z79899 Other long term (current) drug therapy: Secondary | ICD-10-CM | POA: Diagnosis not present

## 2021-07-15 DIAGNOSIS — M62830 Muscle spasm of back: Secondary | ICD-10-CM | POA: Diagnosis not present

## 2021-07-16 DIAGNOSIS — I2510 Atherosclerotic heart disease of native coronary artery without angina pectoris: Secondary | ICD-10-CM | POA: Diagnosis not present

## 2021-07-16 DIAGNOSIS — M545 Low back pain, unspecified: Secondary | ICD-10-CM | POA: Diagnosis not present

## 2021-07-16 DIAGNOSIS — Z89431 Acquired absence of right foot: Secondary | ICD-10-CM | POA: Diagnosis not present

## 2021-07-16 DIAGNOSIS — Z9181 History of falling: Secondary | ICD-10-CM | POA: Diagnosis not present

## 2021-07-18 DIAGNOSIS — E119 Type 2 diabetes mellitus without complications: Secondary | ICD-10-CM | POA: Diagnosis not present

## 2021-07-22 DIAGNOSIS — M542 Cervicalgia: Secondary | ICD-10-CM | POA: Diagnosis not present

## 2021-07-22 DIAGNOSIS — G8929 Other chronic pain: Secondary | ICD-10-CM | POA: Diagnosis not present

## 2021-07-23 DIAGNOSIS — I480 Paroxysmal atrial fibrillation: Secondary | ICD-10-CM | POA: Diagnosis not present

## 2021-07-23 DIAGNOSIS — Z5181 Encounter for therapeutic drug level monitoring: Secondary | ICD-10-CM | POA: Diagnosis not present

## 2021-07-23 DIAGNOSIS — Z86711 Personal history of pulmonary embolism: Secondary | ICD-10-CM | POA: Diagnosis not present

## 2021-07-23 DIAGNOSIS — R079 Chest pain, unspecified: Secondary | ICD-10-CM | POA: Diagnosis not present

## 2021-08-02 DIAGNOSIS — Z794 Long term (current) use of insulin: Secondary | ICD-10-CM | POA: Diagnosis not present

## 2021-08-02 DIAGNOSIS — M4802 Spinal stenosis, cervical region: Secondary | ICD-10-CM | POA: Diagnosis not present

## 2021-08-02 DIAGNOSIS — E119 Type 2 diabetes mellitus without complications: Secondary | ICD-10-CM | POA: Diagnosis not present

## 2021-08-02 DIAGNOSIS — M4803 Spinal stenosis, cervicothoracic region: Secondary | ICD-10-CM | POA: Diagnosis not present

## 2021-08-02 DIAGNOSIS — M47816 Spondylosis without myelopathy or radiculopathy, lumbar region: Secondary | ICD-10-CM | POA: Diagnosis not present

## 2021-08-02 DIAGNOSIS — M5417 Radiculopathy, lumbosacral region: Secondary | ICD-10-CM | POA: Diagnosis not present

## 2021-08-02 DIAGNOSIS — M5412 Radiculopathy, cervical region: Secondary | ICD-10-CM | POA: Diagnosis not present

## 2021-08-02 DIAGNOSIS — M5416 Radiculopathy, lumbar region: Secondary | ICD-10-CM | POA: Diagnosis not present

## 2021-08-09 DIAGNOSIS — M47816 Spondylosis without myelopathy or radiculopathy, lumbar region: Secondary | ICD-10-CM | POA: Diagnosis not present

## 2021-08-14 DIAGNOSIS — Z79899 Other long term (current) drug therapy: Secondary | ICD-10-CM | POA: Diagnosis not present

## 2021-08-14 DIAGNOSIS — G8929 Other chronic pain: Secondary | ICD-10-CM | POA: Diagnosis not present

## 2021-08-14 DIAGNOSIS — M5416 Radiculopathy, lumbar region: Secondary | ICD-10-CM | POA: Diagnosis not present

## 2021-08-14 DIAGNOSIS — M5412 Radiculopathy, cervical region: Secondary | ICD-10-CM | POA: Diagnosis not present

## 2021-08-14 DIAGNOSIS — M62830 Muscle spasm of back: Secondary | ICD-10-CM | POA: Diagnosis not present

## 2021-08-14 DIAGNOSIS — E119 Type 2 diabetes mellitus without complications: Secondary | ICD-10-CM | POA: Diagnosis not present

## 2021-08-16 DIAGNOSIS — Z9181 History of falling: Secondary | ICD-10-CM | POA: Diagnosis not present

## 2021-08-16 DIAGNOSIS — M545 Low back pain, unspecified: Secondary | ICD-10-CM | POA: Diagnosis not present

## 2021-08-16 DIAGNOSIS — I2510 Atherosclerotic heart disease of native coronary artery without angina pectoris: Secondary | ICD-10-CM | POA: Diagnosis not present

## 2021-08-16 DIAGNOSIS — Z89431 Acquired absence of right foot: Secondary | ICD-10-CM | POA: Diagnosis not present

## 2021-08-17 DIAGNOSIS — E119 Type 2 diabetes mellitus without complications: Secondary | ICD-10-CM | POA: Diagnosis not present

## 2021-08-24 DIAGNOSIS — I499 Cardiac arrhythmia, unspecified: Secondary | ICD-10-CM | POA: Diagnosis not present

## 2021-08-24 DIAGNOSIS — R0789 Other chest pain: Secondary | ICD-10-CM | POA: Diagnosis not present

## 2021-08-24 DIAGNOSIS — R110 Nausea: Secondary | ICD-10-CM | POA: Diagnosis not present

## 2021-08-24 DIAGNOSIS — Z743 Need for continuous supervision: Secondary | ICD-10-CM | POA: Diagnosis not present

## 2021-08-24 DIAGNOSIS — R410 Disorientation, unspecified: Secondary | ICD-10-CM | POA: Diagnosis not present

## 2021-08-24 DIAGNOSIS — R072 Precordial pain: Secondary | ICD-10-CM | POA: Diagnosis not present

## 2021-08-24 DIAGNOSIS — R079 Chest pain, unspecified: Secondary | ICD-10-CM | POA: Diagnosis not present

## 2021-08-24 DIAGNOSIS — R0602 Shortness of breath: Secondary | ICD-10-CM | POA: Diagnosis not present

## 2021-09-08 DIAGNOSIS — M25561 Pain in right knee: Secondary | ICD-10-CM | POA: Diagnosis not present

## 2021-09-08 DIAGNOSIS — M5416 Radiculopathy, lumbar region: Secondary | ICD-10-CM | POA: Diagnosis not present

## 2021-09-08 DIAGNOSIS — E119 Type 2 diabetes mellitus without complications: Secondary | ICD-10-CM | POA: Diagnosis not present

## 2021-09-08 DIAGNOSIS — G8929 Other chronic pain: Secondary | ICD-10-CM | POA: Diagnosis not present

## 2021-09-08 DIAGNOSIS — Z79899 Other long term (current) drug therapy: Secondary | ICD-10-CM | POA: Diagnosis not present

## 2021-09-13 DIAGNOSIS — I2510 Atherosclerotic heart disease of native coronary artery without angina pectoris: Secondary | ICD-10-CM | POA: Diagnosis not present

## 2021-09-13 DIAGNOSIS — M545 Low back pain, unspecified: Secondary | ICD-10-CM | POA: Diagnosis not present

## 2021-09-13 DIAGNOSIS — Z9181 History of falling: Secondary | ICD-10-CM | POA: Diagnosis not present

## 2021-09-13 DIAGNOSIS — Z89431 Acquired absence of right foot: Secondary | ICD-10-CM | POA: Diagnosis not present

## 2022-06-18 ENCOUNTER — Other Ambulatory Visit (HOSPITAL_COMMUNITY): Payer: Self-pay | Admitting: Internal Medicine

## 2022-06-18 DIAGNOSIS — I2699 Other pulmonary embolism without acute cor pulmonale: Secondary | ICD-10-CM

## 2022-07-16 ENCOUNTER — Telehealth (HOSPITAL_BASED_OUTPATIENT_CLINIC_OR_DEPARTMENT_OTHER): Payer: Self-pay
